# Patient Record
Sex: Female | Born: 1955 | Race: White | Hispanic: No | Marital: Married | Smoking: Current every day smoker
Health system: Southern US, Community
[De-identification: ages and names within clinical notes are randomized; demographics above are authoritative.]

## PROBLEM LIST (undated history)

## (undated) DIAGNOSIS — Z720 Tobacco use: Secondary | ICD-10-CM

## (undated) DIAGNOSIS — E1165 Type 2 diabetes mellitus with hyperglycemia: Secondary | ICD-10-CM

## (undated) DIAGNOSIS — I639 Cerebral infarction, unspecified: Secondary | ICD-10-CM

## (undated) DIAGNOSIS — E119 Type 2 diabetes mellitus without complications: Secondary | ICD-10-CM

## (undated) DIAGNOSIS — I10 Essential (primary) hypertension: Secondary | ICD-10-CM

---

## 1997-09-05 ENCOUNTER — Emergency Department (HOSPITAL_COMMUNITY): Admission: EM | Admit: 1997-09-05 | Discharge: 1997-09-05 | Payer: Self-pay | Admitting: Emergency Medicine

## 1997-11-26 ENCOUNTER — Emergency Department (HOSPITAL_COMMUNITY): Admission: EM | Admit: 1997-11-26 | Discharge: 1997-11-26 | Payer: Self-pay | Admitting: *Deleted

## 1999-03-27 ENCOUNTER — Inpatient Hospital Stay (HOSPITAL_COMMUNITY): Admission: EM | Admit: 1999-03-27 | Discharge: 1999-03-29 | Payer: Self-pay | Admitting: Emergency Medicine

## 1999-03-27 ENCOUNTER — Encounter: Payer: Self-pay | Admitting: *Deleted

## 2018-05-14 ENCOUNTER — Other Ambulatory Visit: Payer: Self-pay

## 2018-05-14 ENCOUNTER — Emergency Department (HOSPITAL_COMMUNITY): Payer: Medicaid Other

## 2018-05-14 ENCOUNTER — Inpatient Hospital Stay (HOSPITAL_COMMUNITY): Payer: Medicaid Other | Admitting: Anesthesiology

## 2018-05-14 ENCOUNTER — Inpatient Hospital Stay (HOSPITAL_COMMUNITY): Payer: Medicaid Other

## 2018-05-14 ENCOUNTER — Encounter (HOSPITAL_COMMUNITY): Admission: EM | Disposition: A | Discharge status: Home Health | Payer: Self-pay | Source: Home / Self Care

## 2018-05-14 ENCOUNTER — Inpatient Hospital Stay (HOSPITAL_COMMUNITY)
Admission: EM | Admit: 2018-05-14 | Discharge: 2018-05-20 | DRG: 492 | Disposition: A | Discharge status: Home Health | Payer: Medicaid Other | Attending: General Surgery | Admitting: General Surgery

## 2018-05-14 ENCOUNTER — Encounter (HOSPITAL_COMMUNITY): Payer: Self-pay

## 2018-05-14 DIAGNOSIS — R402252 Coma scale, best verbal response, oriented, at arrival to emergency department: Secondary | ICD-10-CM | POA: Diagnosis present

## 2018-05-14 DIAGNOSIS — S2242XA Multiple fractures of ribs, left side, initial encounter for closed fracture: Secondary | ICD-10-CM | POA: Diagnosis present

## 2018-05-14 DIAGNOSIS — E119 Type 2 diabetes mellitus without complications: Secondary | ICD-10-CM | POA: Diagnosis present

## 2018-05-14 DIAGNOSIS — R402362 Coma scale, best motor response, obeys commands, at arrival to emergency department: Secondary | ICD-10-CM | POA: Diagnosis present

## 2018-05-14 DIAGNOSIS — Y92410 Unspecified street and highway as the place of occurrence of the external cause: Secondary | ICD-10-CM

## 2018-05-14 DIAGNOSIS — S52009B Unspecified fracture of upper end of unspecified ulna, initial encounter for open fracture type I or II: Secondary | ICD-10-CM | POA: Diagnosis present

## 2018-05-14 DIAGNOSIS — S8255XA Nondisplaced fracture of medial malleolus of left tibia, initial encounter for closed fracture: Secondary | ICD-10-CM | POA: Diagnosis present

## 2018-05-14 DIAGNOSIS — S42413B Displaced simple supracondylar fracture without intercondylar fracture of unspecified humerus, initial encounter for open fracture: Principal | ICD-10-CM | POA: Diagnosis present

## 2018-05-14 DIAGNOSIS — Z8673 Personal history of transient ischemic attack (TIA), and cerebral infarction without residual deficits: Secondary | ICD-10-CM

## 2018-05-14 DIAGNOSIS — M542 Cervicalgia: Secondary | ICD-10-CM | POA: Diagnosis present

## 2018-05-14 DIAGNOSIS — S52272C Monteggia's fracture of left ulna, initial encounter for open fracture type IIIA, IIIB, or IIIC: Secondary | ICD-10-CM | POA: Diagnosis present

## 2018-05-14 DIAGNOSIS — S8253XA Displaced fracture of medial malleolus of unspecified tibia, initial encounter for closed fracture: Secondary | ICD-10-CM

## 2018-05-14 DIAGNOSIS — R402142 Coma scale, eyes open, spontaneous, at arrival to emergency department: Secondary | ICD-10-CM | POA: Diagnosis present

## 2018-05-14 DIAGNOSIS — F1290 Cannabis use, unspecified, uncomplicated: Secondary | ICD-10-CM | POA: Diagnosis present

## 2018-05-14 DIAGNOSIS — D62 Acute posthemorrhagic anemia: Secondary | ICD-10-CM | POA: Diagnosis present

## 2018-05-14 DIAGNOSIS — S42492B Other displaced fracture of lower end of left humerus, initial encounter for open fracture: Secondary | ICD-10-CM

## 2018-05-14 DIAGNOSIS — I10 Essential (primary) hypertension: Secondary | ICD-10-CM | POA: Diagnosis present

## 2018-05-14 DIAGNOSIS — S46312A Strain of muscle, fascia and tendon of triceps, left arm, initial encounter: Secondary | ICD-10-CM | POA: Insufficient documentation

## 2018-05-14 DIAGNOSIS — S2232XA Fracture of one rib, left side, initial encounter for closed fracture: Secondary | ICD-10-CM

## 2018-05-14 DIAGNOSIS — G8929 Other chronic pain: Secondary | ICD-10-CM | POA: Diagnosis present

## 2018-05-14 DIAGNOSIS — Z419 Encounter for procedure for purposes other than remedying health state, unspecified: Secondary | ICD-10-CM

## 2018-05-14 DIAGNOSIS — Z79899 Other long term (current) drug therapy: Secondary | ICD-10-CM

## 2018-05-14 DIAGNOSIS — F17210 Nicotine dependence, cigarettes, uncomplicated: Secondary | ICD-10-CM | POA: Diagnosis present

## 2018-05-14 DIAGNOSIS — E1165 Type 2 diabetes mellitus with hyperglycemia: Secondary | ICD-10-CM | POA: Diagnosis present

## 2018-05-14 DIAGNOSIS — S42402B Unspecified fracture of lower end of left humerus, initial encounter for open fracture: Secondary | ICD-10-CM

## 2018-05-14 DIAGNOSIS — T1490XA Injury, unspecified, initial encounter: Secondary | ICD-10-CM | POA: Diagnosis present

## 2018-05-14 DIAGNOSIS — R609 Edema, unspecified: Secondary | ICD-10-CM

## 2018-05-14 DIAGNOSIS — T148XXA Other injury of unspecified body region, initial encounter: Secondary | ICD-10-CM

## 2018-05-14 DIAGNOSIS — S52272B Monteggia's fracture of left ulna, initial encounter for open fracture type I or II: Secondary | ICD-10-CM | POA: Diagnosis present

## 2018-05-14 DIAGNOSIS — S42402A Unspecified fracture of lower end of left humerus, initial encounter for closed fracture: Secondary | ICD-10-CM | POA: Diagnosis present

## 2018-05-14 HISTORY — DX: Essential (primary) hypertension: I10

## 2018-05-14 HISTORY — PX: EXTERNAL FIXATION ARM: SHX1552

## 2018-05-14 HISTORY — DX: Type 2 diabetes mellitus without complications: E11.9

## 2018-05-14 HISTORY — DX: Type 2 diabetes mellitus with hyperglycemia: E11.65

## 2018-05-14 HISTORY — DX: Cerebral infarction, unspecified: I63.9

## 2018-05-14 LAB — COMPREHENSIVE METABOLIC PANEL
ALT: 19 U/L (ref 0–44)
AST: 28 U/L (ref 15–41)
Albumin: 3.7 g/dL (ref 3.5–5.0)
Alkaline Phosphatase: 82 U/L (ref 38–126)
Anion gap: 15 (ref 5–15)
BUN: 14 mg/dL (ref 8–23)
CO2: 21 mmol/L — ABNORMAL LOW (ref 22–32)
Calcium: 9.1 mg/dL (ref 8.9–10.3)
Chloride: 100 mmol/L (ref 98–111)
Creatinine, Ser: 0.74 mg/dL (ref 0.44–1.00)
GFR calc Af Amer: >60 mL/min (ref >60)
GFR calc non Af Amer: >60 mL/min (ref >60)
Glucose, Bld: 348 mg/dL — ABNORMAL HIGH (ref 70–99)
Potassium: 3.4 mmol/L — ABNORMAL LOW (ref 3.5–5.1)
Sodium: 136 mmol/L (ref 135–145)
Total Bilirubin: 1 mg/dL (ref 0.3–1.2)
Total Protein: 6.7 g/dL (ref 6.5–8.1)

## 2018-05-14 LAB — CBC
HCT: 44.1 % (ref 36.0–46.0)
Hemoglobin: 14.3 g/dL (ref 12.0–15.0)
MCH: 30.5 pg (ref 26.0–34.0)
MCHC: 32.4 g/dL (ref 30.0–36.0)
MCV: 94 fL (ref 80.0–100.0)
Platelets: 279 10*3/uL (ref 150–400)
RBC: 4.69 MIL/uL (ref 3.87–5.11)
RDW: 12 % (ref 11.5–15.5)
WBC: 10.7 10*3/uL — ABNORMAL HIGH (ref 4.0–10.5)
nRBC: 0 % (ref 0.0–0.2)

## 2018-05-14 LAB — GLUCOSE, CAPILLARY
Glucose-Capillary: 359 mg/dL — ABNORMAL HIGH (ref 70–99)
Glucose-Capillary: 303 mg/dL — ABNORMAL HIGH (ref 70–99)
Glucose-Capillary: 296 mg/dL — ABNORMAL HIGH (ref 70–99)
Glucose-Capillary: 272 mg/dL — ABNORMAL HIGH (ref 70–99)
Glucose-Capillary: 239 mg/dL — ABNORMAL HIGH (ref 70–99)

## 2018-05-14 LAB — PROTIME-INR
INR: 0.9 (ref 0.8–1.2)
Prothrombin Time: 12.1 seconds (ref 11.4–15.2)

## 2018-05-14 LAB — LACTIC ACID, PLASMA: Lactic Acid, Venous: 3 mmol/L (ref 0.5–1.9)

## 2018-05-14 LAB — HEMOGLOBIN A1C
Hgb A1c MFr Bld: 9.9 % — ABNORMAL HIGH (ref 4.8–5.6)
Mean Plasma Glucose: 237.43 mg/dL

## 2018-05-14 LAB — MRSA PCR SCREENING: MRSA by PCR: NEGATIVE

## 2018-05-14 LAB — SAMPLE TO BLOOD BANK

## 2018-05-14 LAB — ETHANOL: Alcohol, Ethyl (B): <10 mg/dL (ref <10)

## 2018-05-14 LAB — CBG MONITORING, ED: Glucose-Capillary: 376 mg/dL — ABNORMAL HIGH (ref 70–99)

## 2018-05-14 LAB — CDS SEROLOGY
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
## 2018-05-14 IMAGING — CT 3-DIMENSIONAL CT IMAGE RENDERING ON ACQUISITION WORKSTATION
2 of 3 series · 15 of 46 positions shown, 17 images · non-contrast
Comparison: Left elbow x-rays from same day.

CLINICAL DATA: Distal humerus and proximal ulna fractures.

EXAM:
3-DIMENSIONAL CT IMAGE RENDERING ON ACQUISITION WORKSTATION
TECHNIQUE: 3-dimensional CT images were rendered by post-processing of the
original CT data on an acquisition workstation. The 3-dimensional CT
images were interpreted and findings were reported in the
accompanying complete CT report for this study.

[Series 6: extremity soft tissue · axial · 0.47mm/px · z∈[-876,-660]mm · 12 of 126 slices shown, 14 images]
[im 9/126  soft-tissue]
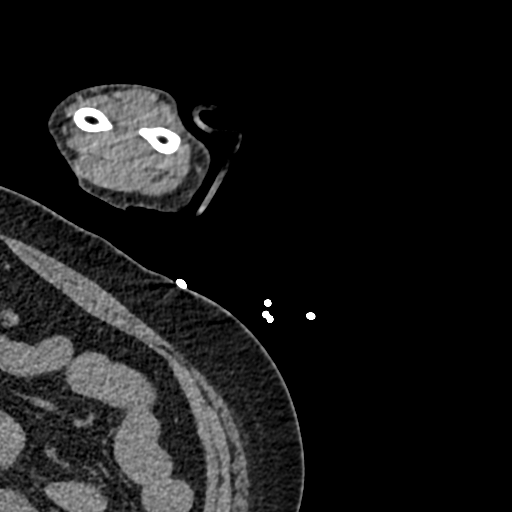
[im 9/126  bone]
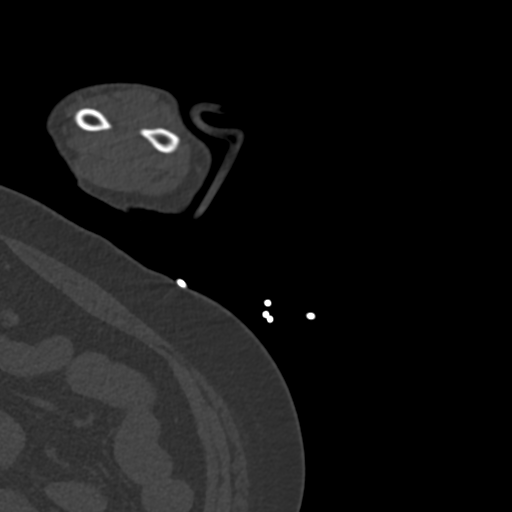
[im 17/126  soft-tissue]
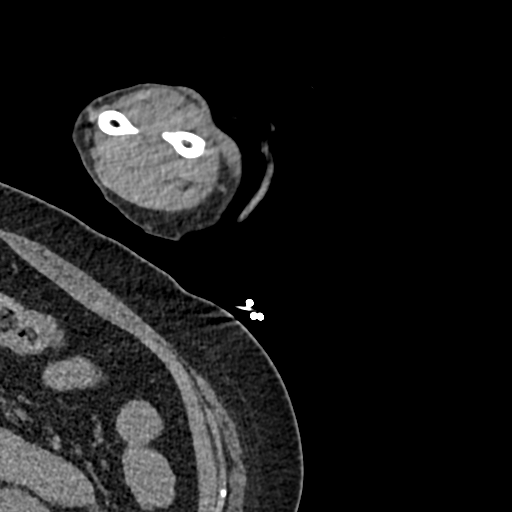
[im 29/126  soft-tissue]
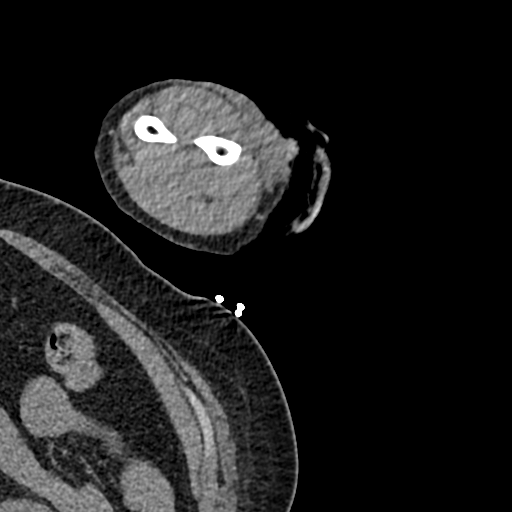
[im 37/126  soft-tissue]
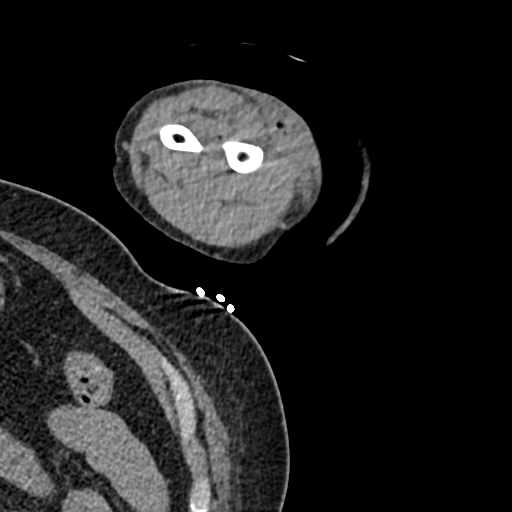
[im 49/126  soft-tissue]
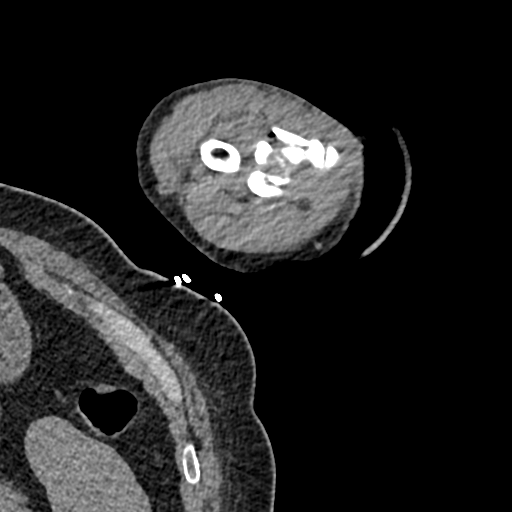
[im 57/126  soft-tissue]
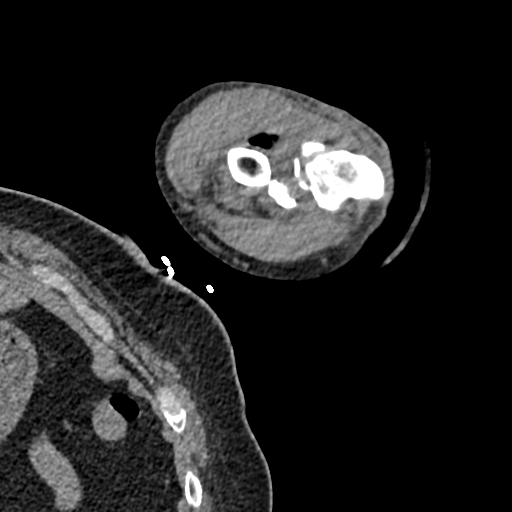
[im 69/126  soft-tissue]
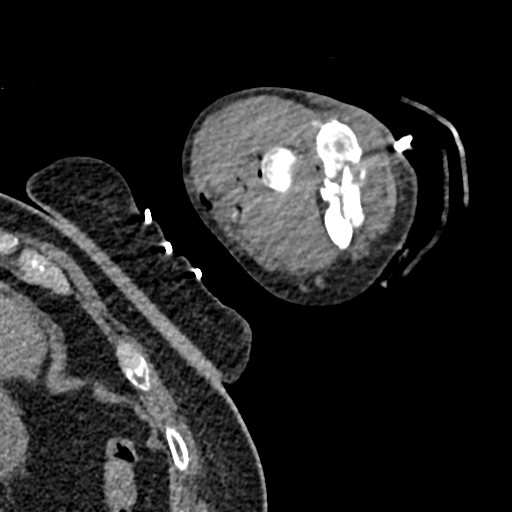
[im 77/126  soft-tissue]
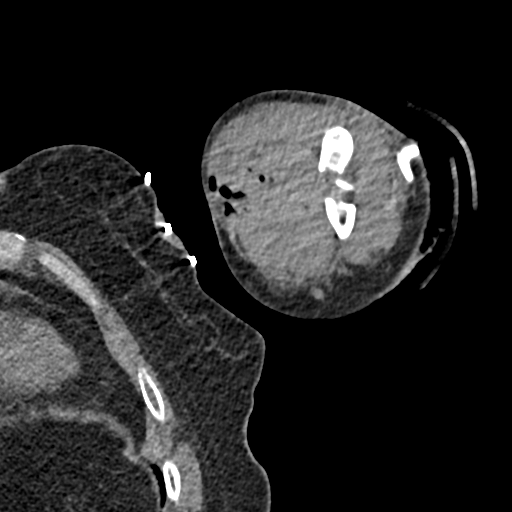
[im 89/126  soft-tissue]
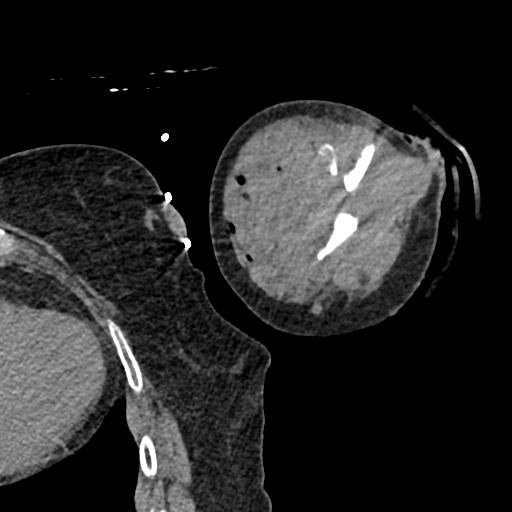
[im 89/126  bone]
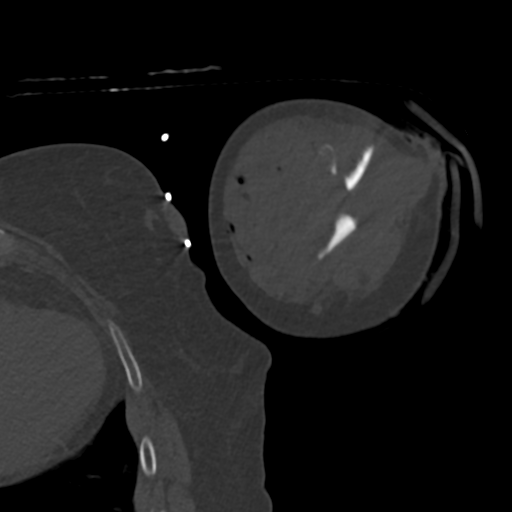
[im 97/126  soft-tissue]
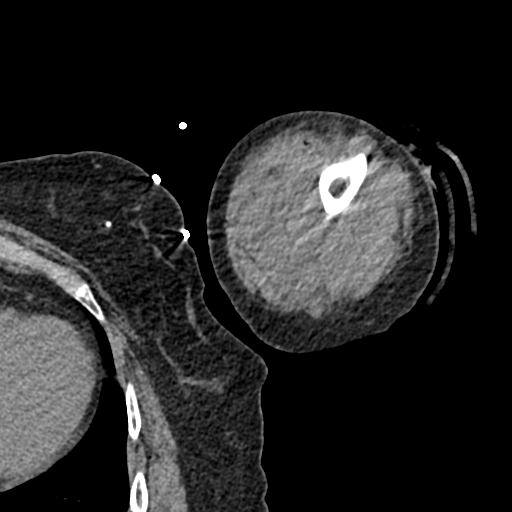
[im 109/126  soft-tissue]
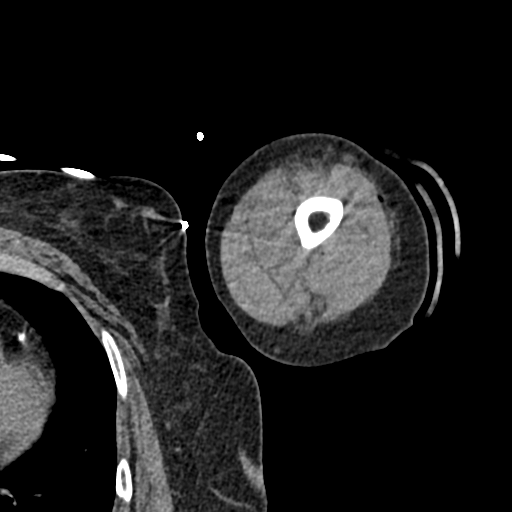
[im 117/126  soft-tissue]
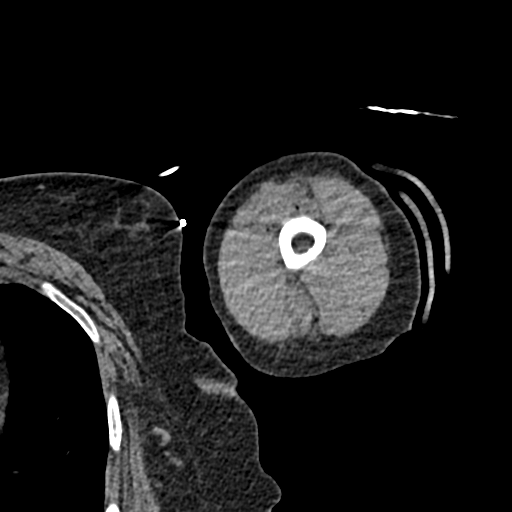

[Series 9: cor soft · coronal · 0.28mm/px · 3 of 66 slices shown]
[im 22/66  soft-tissue]
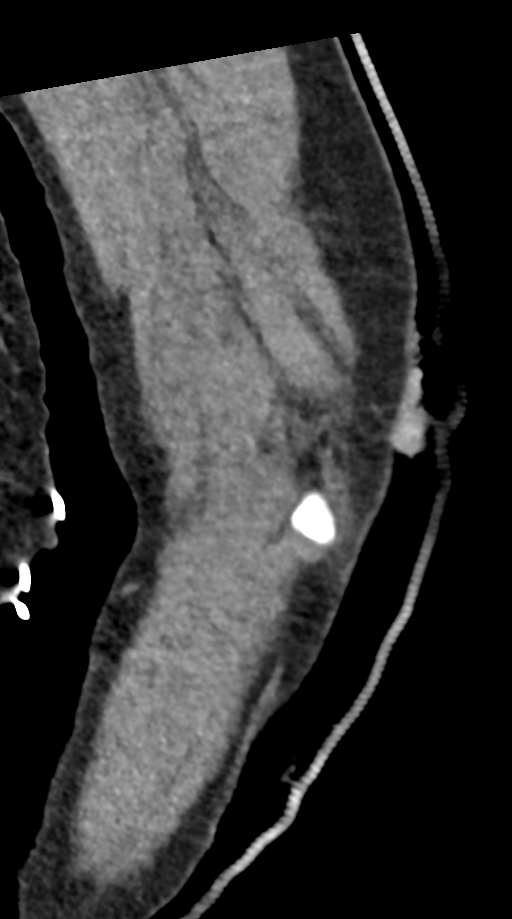
[im 29/66  soft-tissue]
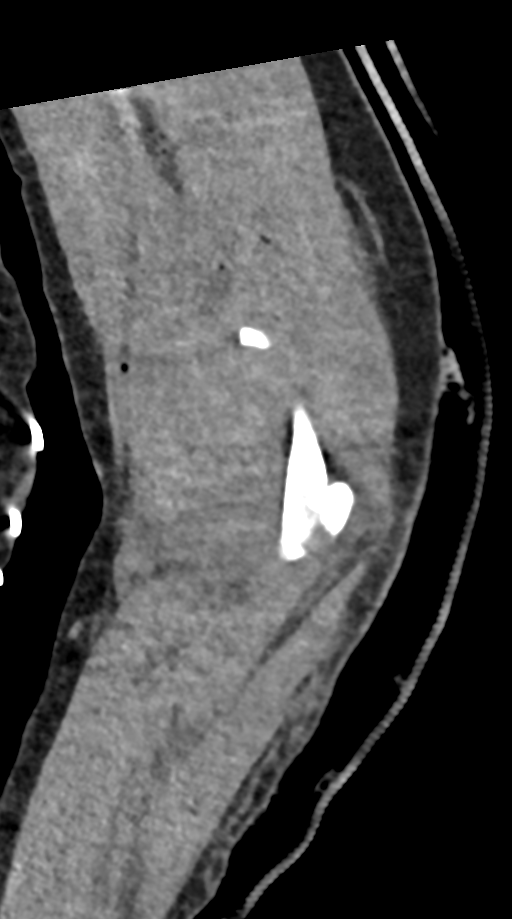
[im 37/66  soft-tissue]
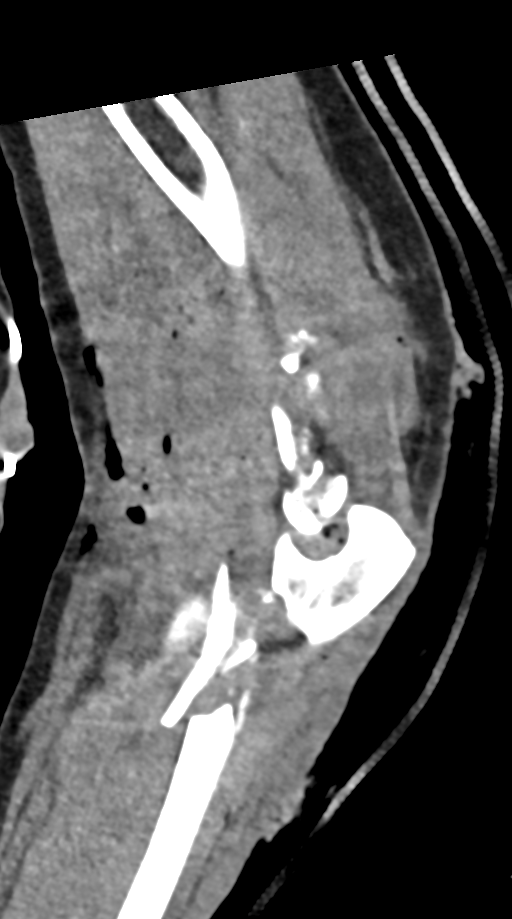

[15 of 46 positions shown; findings below may reference images not displayed]

FINDINGS: Three-dimensional CT images again demonstrate severely comminuted,
displaced fractures of the distal humerus and proximal ulna with
radial head dislocation.
IMPRESSION: Three-dimensional reconstructions of left elbow
fracture-dislocation, fully characterized in the separate CT elbow
report from same day.

## 2018-05-14 IMAGING — RF LEFT ELBOW - 2 VIEW
1 series · 6 of 6 positions shown · non-contrast
Comparison: Radiography same day

CLINICAL DATA: External fixation

EXAM:
DG C-ARM 61-120 MIN; LEFT ELBOW - 2 VIEW

[Series 1: run · 6 of 6 slices shown]
[im 1/6]
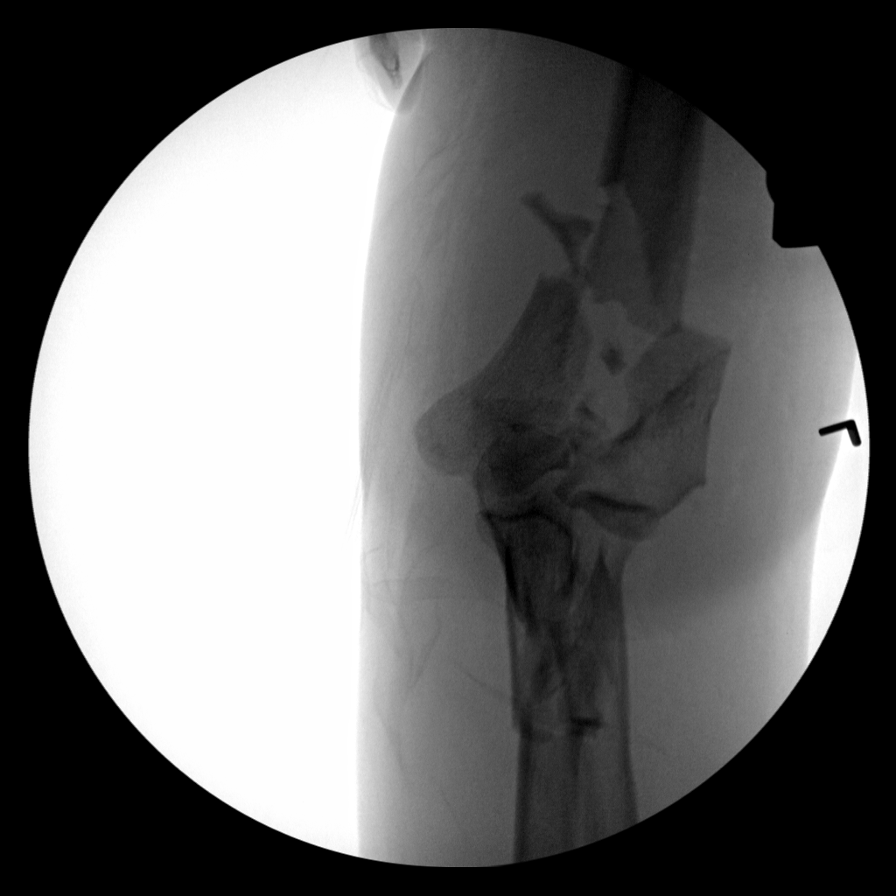
[im 2/6]
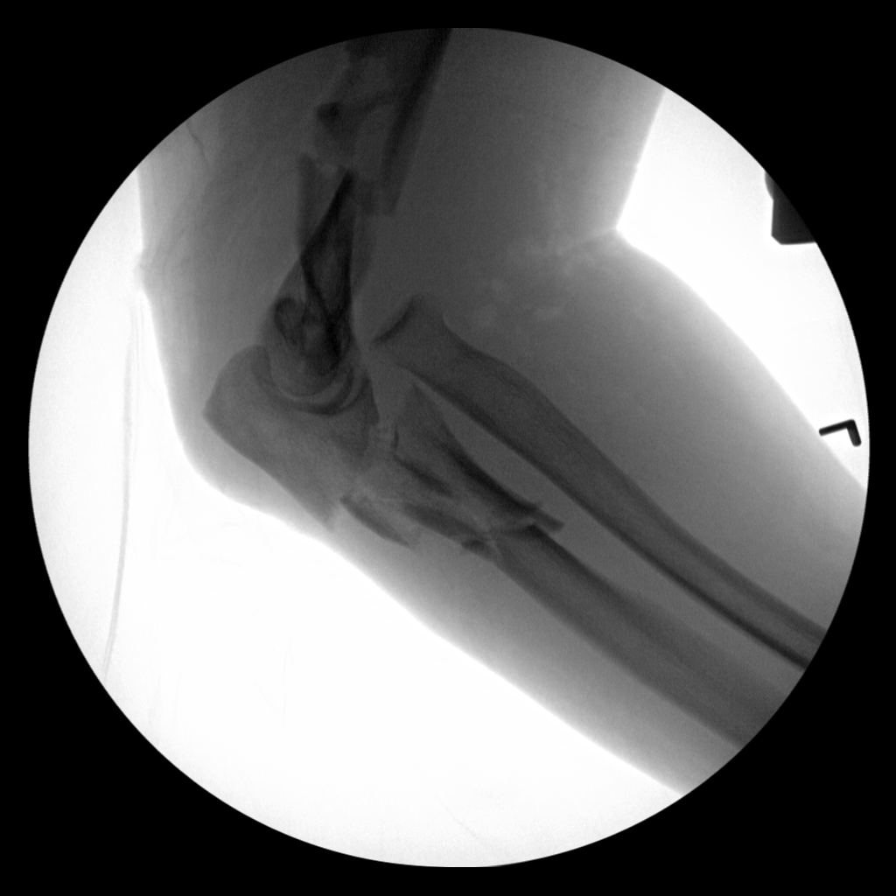
[im 3/6]
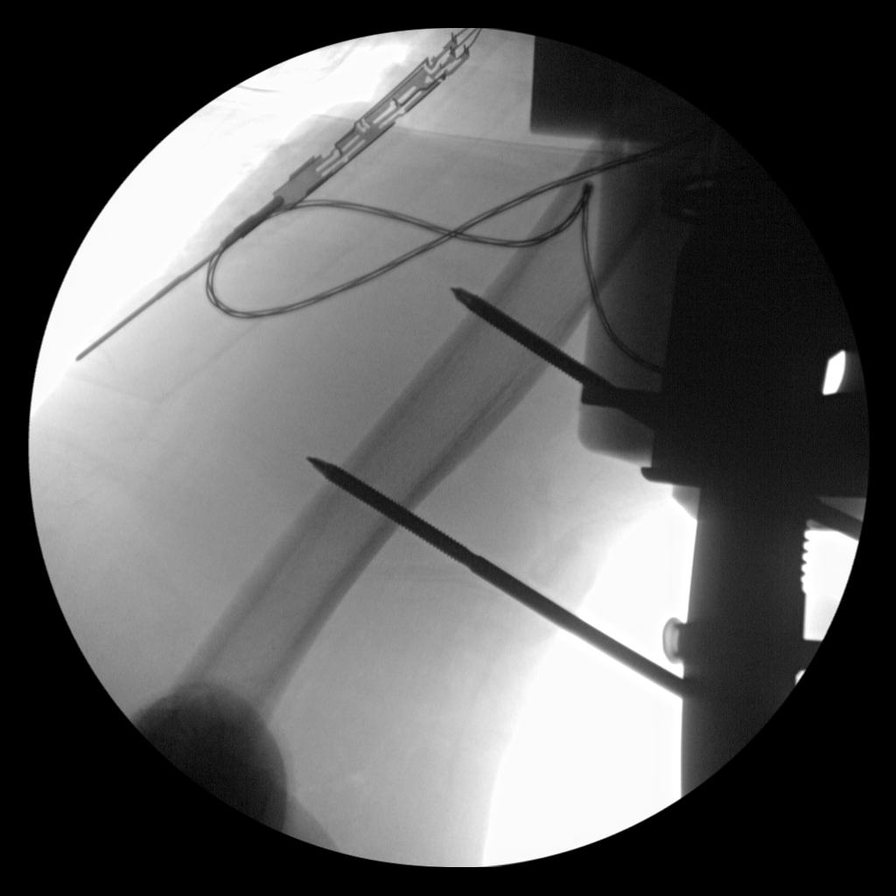
[im 4/6]
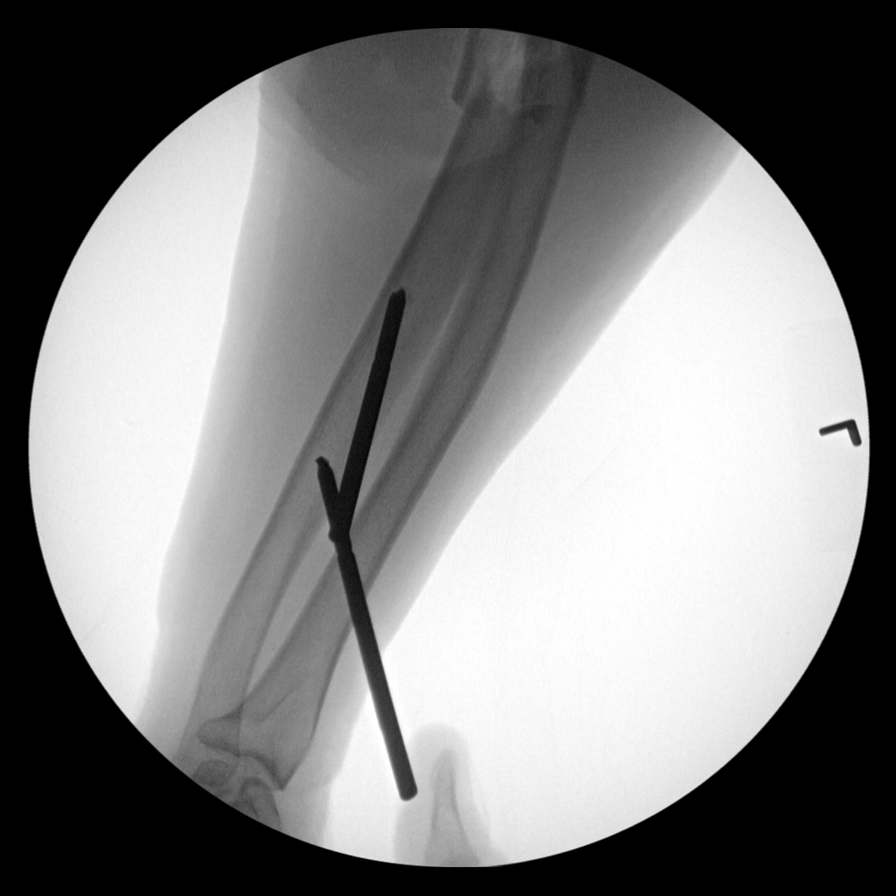
[im 5/6]
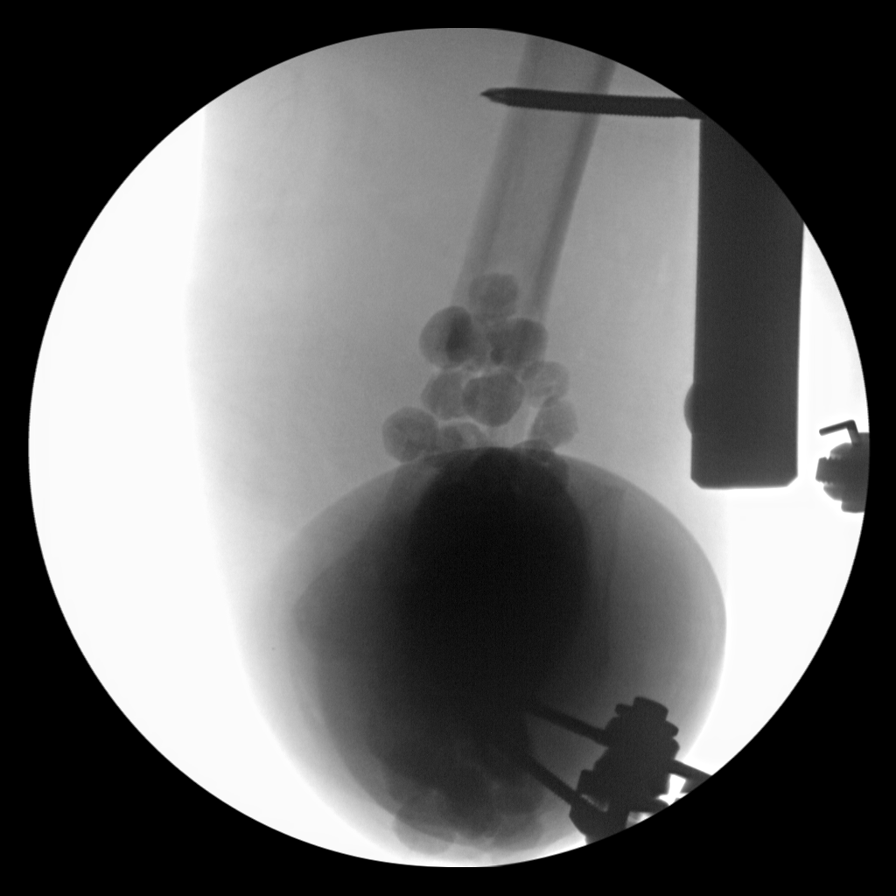
[im 6/6]
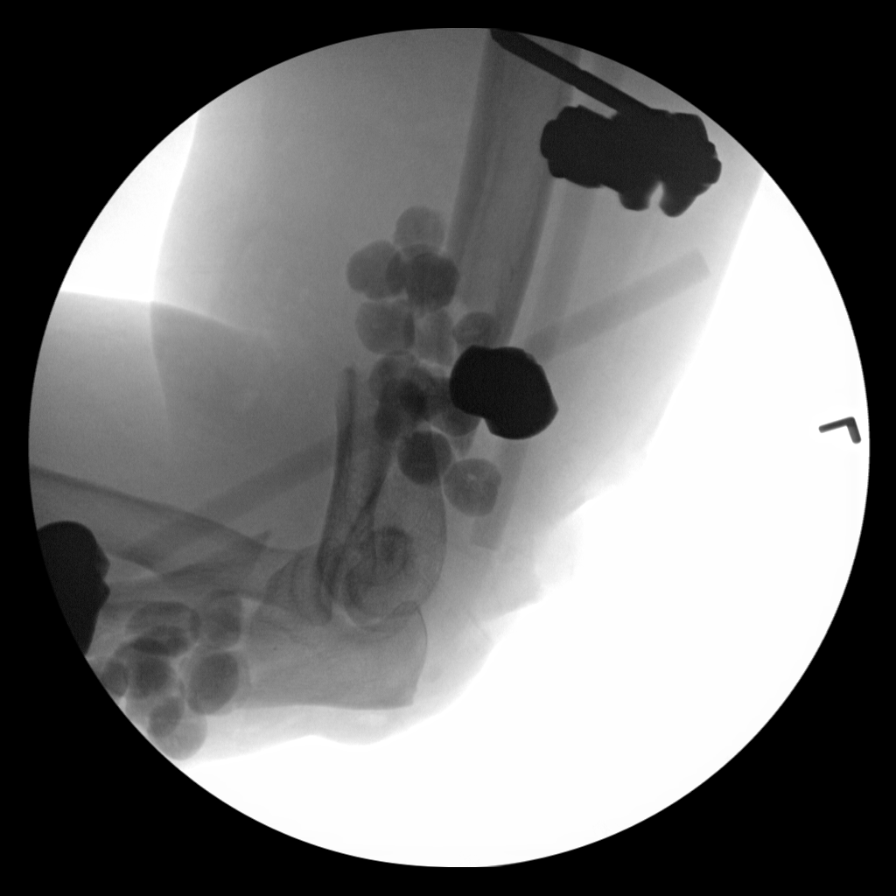

[6 of 6 positions shown; findings below may reference images not displayed]

FINDINGS: External fixation is applied for massively comminuted fractures of
the distal humerus proximal ulna with dislocation of the radial
head. Treatment beads are placed in the regions the fractures.
IMPRESSION: External fixation. Multiple treatment bead implants at the sites of
the comminuted open fractures

## 2018-05-14 IMAGING — DX LEFT ELBOW - 2 VIEW
2 series · 2 of 2 positions shown · non-contrast
Comparison: Intraoperative films of earlier today.

CLINICAL DATA: Fracture.

EXAM:
LEFT ELBOW - 2 VIEW

[elbow ap]
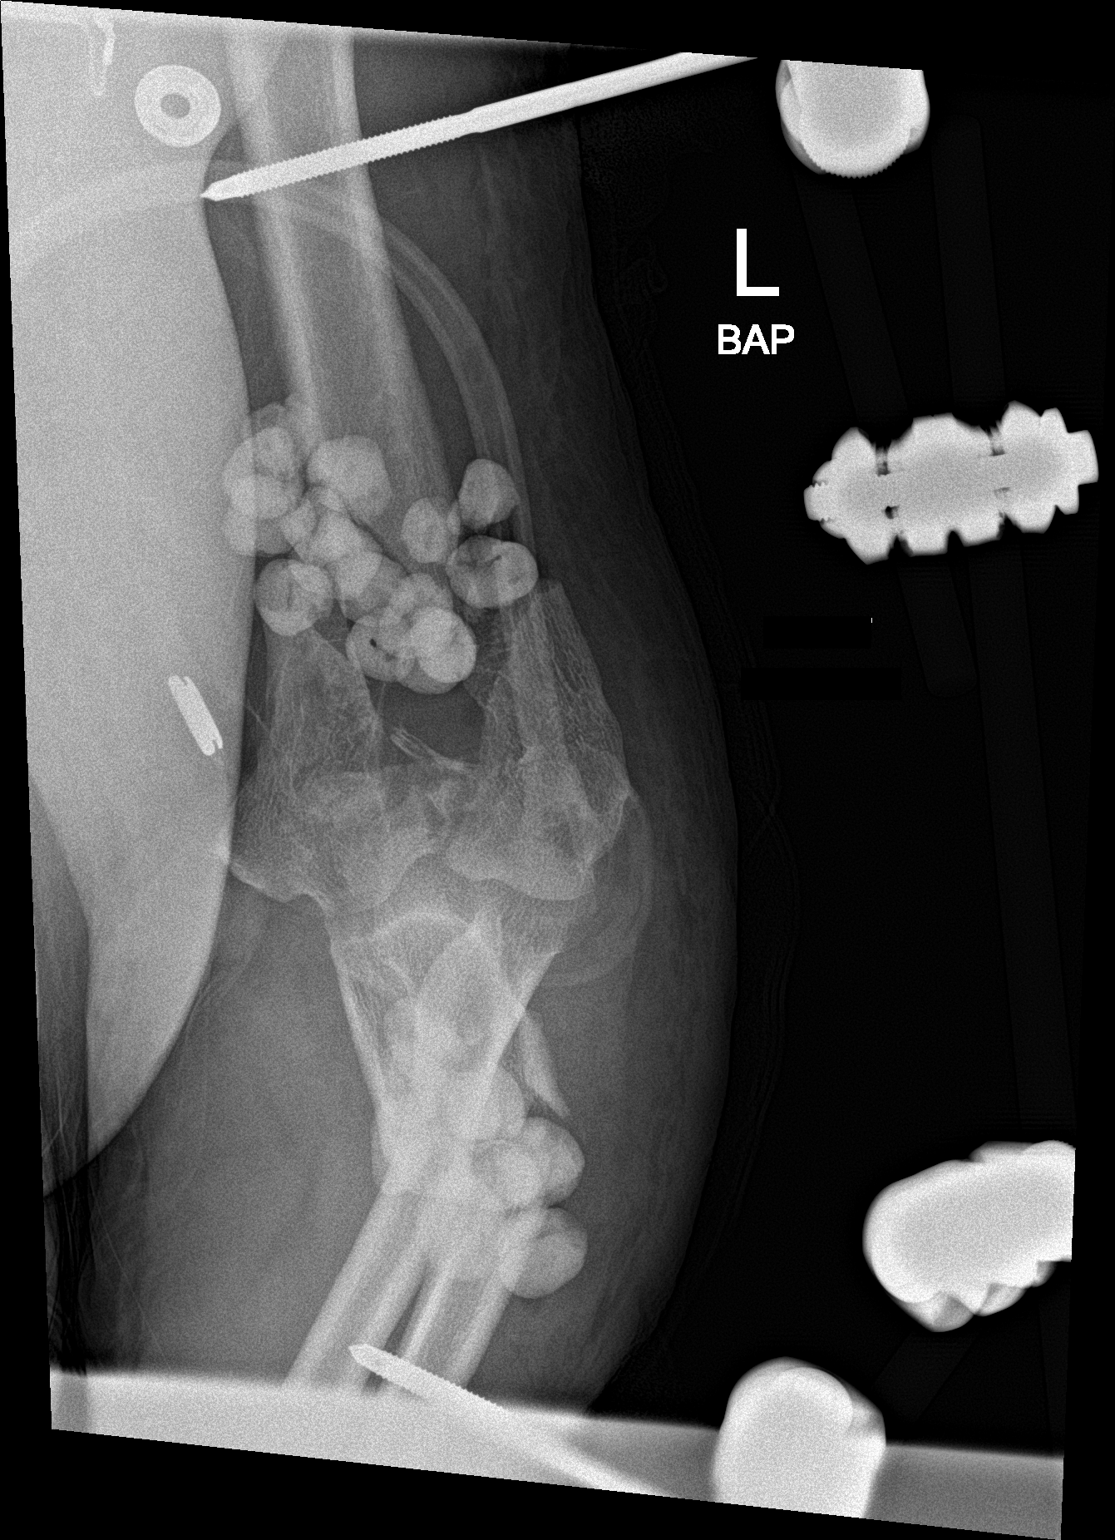

[elbow lat]
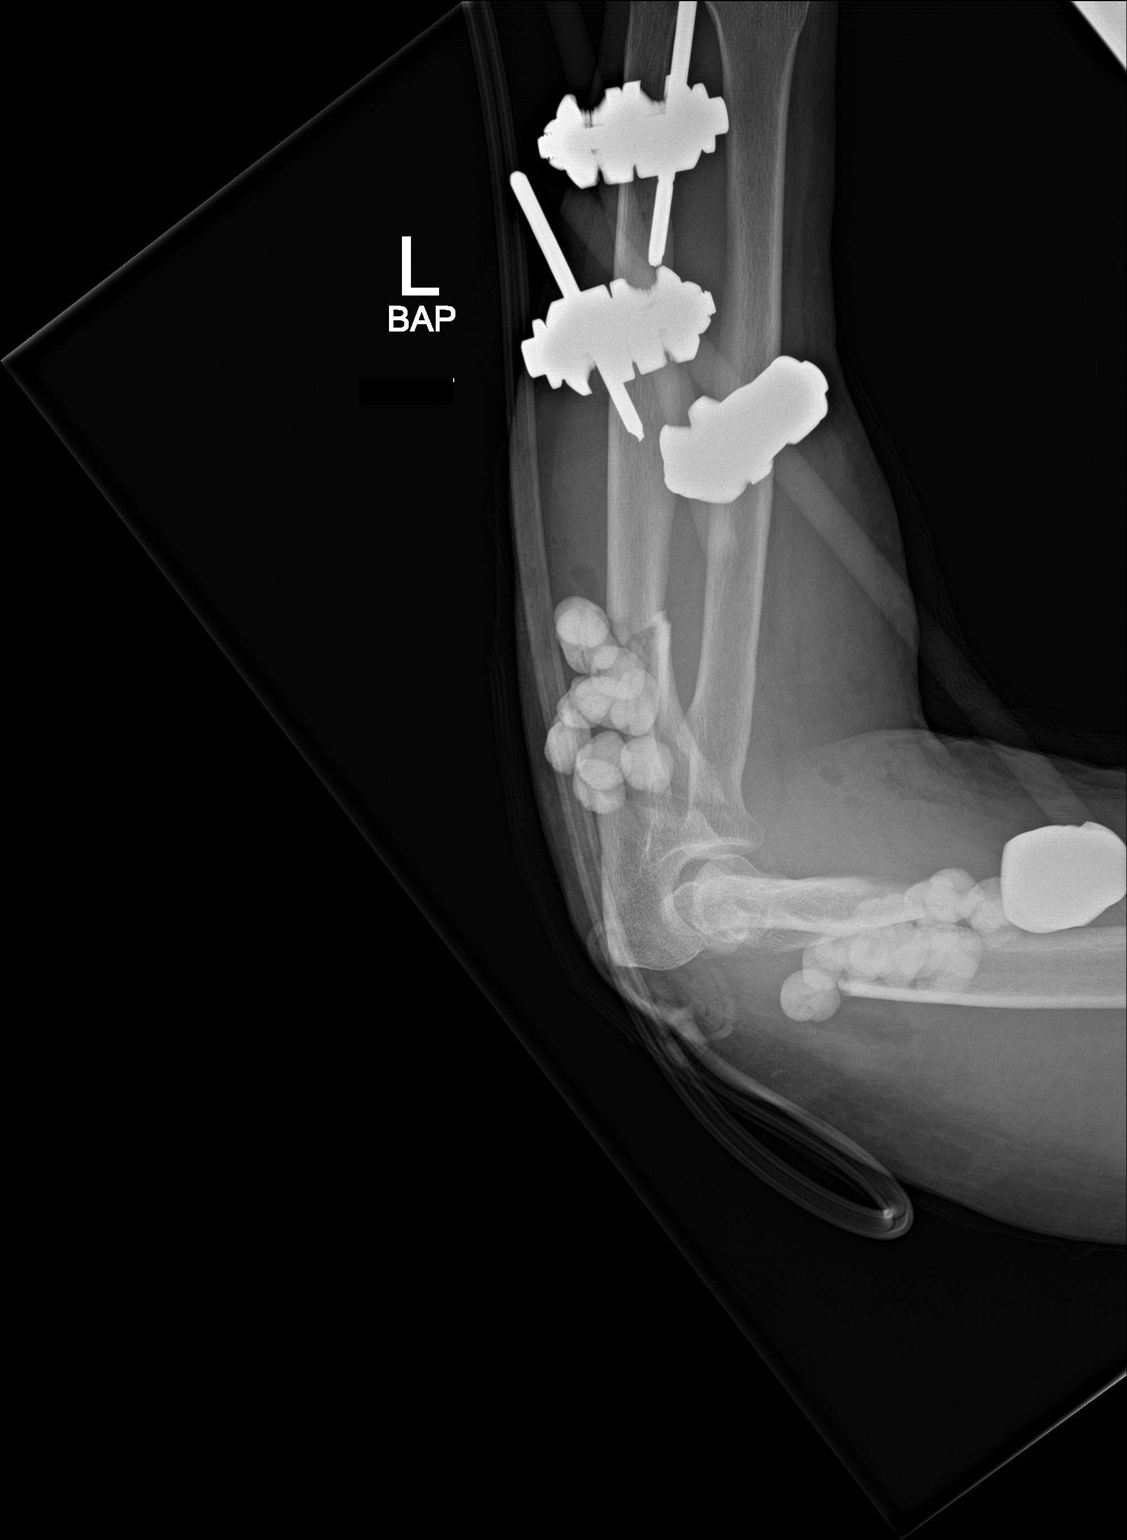

[2 of 2 positions shown; findings below may reference images not displayed]

FINDINGS: [1B] hours. External fixation devices which are incompletely imaged.
Comminuted fractures of the distal humerus and proximal ulna, with
treatment beads. The radial head appears relocated on the lateral
view. The attempted AP view is suboptimal secondary to positioning.
The humerus fracture remains significantly displaced with override.
IMPRESSION: Distal humerus and proximal ulnar fractures, s/p external fixation.
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
## 2018-05-14 SURGERY — EXTERNAL FIXATION, UPPER EXTREMITY
Anesthesia: General | Site: Elbow | Laterality: Left

## 2018-05-14 MED ORDER — ONDANSETRON HCL 4 MG/2ML IJ SOLN
INTRAMUSCULAR | Status: AC
  Filled 2018-05-14: qty 2

## 2018-05-14 MED ORDER — SUGAMMADEX SODIUM 500 MG/5ML IV SOLN
INTRAVENOUS | Status: DC | PRN
  Administered 2018-05-14: 200 mg via INTRAVENOUS

## 2018-05-14 MED ORDER — METHOCARBAMOL 500 MG PO TABS
ORAL_TABLET | ORAL | Status: AC
  Filled 2018-05-14: qty 1

## 2018-05-14 MED ORDER — SCOPOLAMINE 1 MG/3DAYS TD PT72
MEDICATED_PATCH | TRANSDERMAL | Status: DC | PRN
  Administered 2018-05-14: 1 via TRANSDERMAL

## 2018-05-14 MED ORDER — ROCURONIUM BROMIDE 50 MG/5ML IV SOSY
PREFILLED_SYRINGE | INTRAVENOUS | Status: DC | PRN
  Administered 2018-05-14: 30 mg via INTRAVENOUS

## 2018-05-14 MED ORDER — FENTANYL CITRATE (PF) 100 MCG/2ML IJ SOLN
INTRAMUSCULAR | Status: AC | PRN
  Administered 2018-05-14: 50 ug via INTRAVENOUS

## 2018-05-14 MED ORDER — FENTANYL CITRATE (PF) 250 MCG/5ML IJ SOLN
INTRAMUSCULAR | Status: AC
  Filled 2018-05-14: qty 5

## 2018-05-14 MED ORDER — DIPHENHYDRAMINE HCL 50 MG/ML IJ SOLN
INTRAMUSCULAR | Status: DC | PRN
  Administered 2018-05-14: 12.5 mg via INTRAVENOUS

## 2018-05-14 MED ORDER — PANTOPRAZOLE SODIUM 40 MG PO TBEC
40.0000 mg | DELAYED_RELEASE_TABLET | Freq: Every day | ORAL | Status: DC
  Administered 2018-05-15 – 2018-05-20 (×5): 40 mg via ORAL
  Filled 2018-05-14 (×5): qty 1

## 2018-05-14 MED ORDER — ACETAMINOPHEN 325 MG PO TABS
650.0000 mg | ORAL_TABLET | Freq: Four times a day (QID) | ORAL | Status: DC
  Administered 2018-05-14 – 2018-05-20 (×18): 650 mg via ORAL
  Filled 2018-05-14 (×19): qty 2

## 2018-05-14 MED ORDER — VANCOMYCIN HCL 1000 MG IV SOLR
INTRAVENOUS | Status: DC | PRN
  Administered 2018-05-14: 1000 mg via TOPICAL
  Administered 2018-05-14: 1000 mg

## 2018-05-14 MED ORDER — SUCCINYLCHOLINE CHLORIDE 200 MG/10ML IV SOSY
PREFILLED_SYRINGE | INTRAVENOUS | Status: DC | PRN
  Administered 2018-05-14: 200 mg via INTRAVENOUS

## 2018-05-14 MED ORDER — SODIUM CHLORIDE 0.9 % IV SOLN
2.0000 g | INTRAVENOUS | Status: AC
  Administered 2018-05-14 – 2018-05-16 (×3): 2 g via INTRAVENOUS
  Filled 2018-05-14 (×4): qty 20

## 2018-05-14 MED ORDER — PROMETHAZINE HCL 25 MG/ML IJ SOLN
6.2500 mg | Freq: Four times a day (QID) | INTRAMUSCULAR | Status: DC | PRN
  Administered 2018-05-14: 6.25 mg via INTRAVENOUS

## 2018-05-14 MED ORDER — OXYCODONE HCL 5 MG PO TABS
5.0000 mg | ORAL_TABLET | ORAL | Status: DC | PRN
  Administered 2018-05-14 – 2018-05-16 (×10): 10 mg via ORAL
  Administered 2018-05-17: 5 mg via ORAL
  Administered 2018-05-17 – 2018-05-19 (×6): 10 mg via ORAL
  Filled 2018-05-14: qty 2
  Filled 2018-05-14: qty 1
  Filled 2018-05-14 (×17): qty 2

## 2018-05-14 MED ORDER — HYDROMORPHONE HCL 1 MG/ML IJ SOLN
INTRAMUSCULAR | Status: AC
  Filled 2018-05-14: qty 1

## 2018-05-14 MED ORDER — HYDROMORPHONE HCL 1 MG/ML IJ SOLN: 1.0000 mg | INTRAMUSCULAR | Status: DC | PRN

## 2018-05-14 MED ORDER — PROMETHAZINE HCL 25 MG/ML IJ SOLN
INTRAMUSCULAR | Status: AC
  Filled 2018-05-14: qty 1

## 2018-05-14 MED ORDER — VANCOMYCIN HCL 1000 MG IV SOLR
INTRAVENOUS | Status: AC
  Filled 2018-05-14: qty 1000

## 2018-05-14 MED ORDER — INSULIN ASPART 100 UNIT/ML ~~LOC~~ SOLN
SUBCUTANEOUS | Status: DC | PRN
  Administered 2018-05-14: 4 [IU] via SUBCUTANEOUS

## 2018-05-14 MED ORDER — CEFAZOLIN SODIUM-DEXTROSE 1-4 GM/50ML-% IV SOLN
1.0000 g | Freq: Once | INTRAVENOUS | Status: DC
  Filled 2018-05-14: qty 50

## 2018-05-14 MED ORDER — INSULIN ASPART 100 UNIT/ML ~~LOC~~ SOLN
4.0000 [IU] | SUBCUTANEOUS | Status: DC
  Filled 2018-05-14: qty 0.04

## 2018-05-14 MED ORDER — TOBRAMYCIN SULFATE 1.2 G IJ SOLR
INTRAMUSCULAR | Status: AC
  Filled 2018-05-14: qty 2.4

## 2018-05-14 MED ORDER — METOPROLOL TARTRATE 12.5 MG HALF TABLET
12.5000 mg | ORAL_TABLET | Freq: Two times a day (BID) | ORAL | Status: DC
  Administered 2018-05-15 – 2018-05-20 (×11): 12.5 mg via ORAL
  Filled 2018-05-14 (×11): qty 1

## 2018-05-14 MED ORDER — CEFAZOLIN SODIUM-DEXTROSE 1-4 GM/50ML-% IV SOLN
1.0000 g | Freq: Three times a day (TID) | INTRAVENOUS | Status: DC
  Administered 2018-05-14 (×2): 1 g via INTRAVENOUS
  Filled 2018-05-14 (×2): qty 50

## 2018-05-14 MED ORDER — LIDOCAINE 2% (20 MG/ML) 5 ML SYRINGE
INTRAMUSCULAR | Status: AC
  Filled 2018-05-14: qty 5

## 2018-05-14 MED ORDER — SODIUM CHLORIDE 0.9 % IV SOLN
INTRAVENOUS | Status: DC
  Administered 2018-05-14 – 2018-05-19 (×4): via INTRAVENOUS

## 2018-05-14 MED ORDER — HYDRALAZINE HCL 20 MG/ML IJ SOLN
10.0000 mg | INTRAMUSCULAR | Status: DC | PRN
  Administered 2018-05-19: 04:00:00 10 mg via INTRAVENOUS
  Filled 2018-05-14: qty 1

## 2018-05-14 MED ORDER — TETANUS-DIPHTH-ACELL PERTUSSIS 5-2.5-18.5 LF-MCG/0.5 IM SUSP
0.5000 mL | Freq: Once | INTRAMUSCULAR | Status: AC
  Administered 2018-05-14: 10:00:00 0.5 mL via INTRAMUSCULAR
  Filled 2018-05-14: qty 0.5

## 2018-05-14 MED ORDER — ACETAMINOPHEN 325 MG PO TABS: 650.0000 mg | ORAL_TABLET | ORAL | Status: DC | PRN

## 2018-05-14 MED ORDER — 0.9 % SODIUM CHLORIDE (POUR BTL) OPTIME
TOPICAL | Status: DC | PRN
  Administered 2018-05-14: 1000 mL

## 2018-05-14 MED ORDER — DEXAMETHASONE SODIUM PHOSPHATE 10 MG/ML IJ SOLN
INTRAMUSCULAR | Status: AC
  Filled 2018-05-14: qty 1

## 2018-05-14 MED ORDER — SODIUM CHLORIDE 0.9 % IV SOLN
INTRAVENOUS | Status: DC | PRN
  Administered 2018-05-14: 16:00:00 via INTRAVENOUS

## 2018-05-14 MED ORDER — FENTANYL CITRATE (PF) 100 MCG/2ML IJ SOLN
50.0000 ug | Freq: Once | INTRAMUSCULAR | Status: AC
  Administered 2018-05-14 (×3): 50 ug via INTRAVENOUS
  Administered 2018-05-14 (×2): 100 ug via INTRAVENOUS

## 2018-05-14 MED ORDER — FENTANYL CITRATE (PF) 100 MCG/2ML IJ SOLN
25.0000 ug | INTRAMUSCULAR | Status: DC | PRN
  Administered 2018-05-14: 17:00:00 50 ug via INTRAVENOUS

## 2018-05-14 MED ORDER — PHENYLEPHRINE 40 MCG/ML (10ML) SYRINGE FOR IV PUSH (FOR BLOOD PRESSURE SUPPORT)
PREFILLED_SYRINGE | INTRAVENOUS | Status: AC
  Filled 2018-05-14: qty 10

## 2018-05-14 MED ORDER — DOCUSATE SODIUM 100 MG PO CAPS
100.0000 mg | ORAL_CAPSULE | Freq: Two times a day (BID) | ORAL | Status: DC
  Administered 2018-05-14 – 2018-05-20 (×11): 100 mg via ORAL
  Filled 2018-05-14 (×11): qty 1

## 2018-05-14 MED ORDER — SODIUM CHLORIDE 0.9 % IR SOLN
Status: DC | PRN
  Administered 2018-05-14 (×3): 3000 mL

## 2018-05-14 MED ORDER — PANTOPRAZOLE SODIUM 40 MG IV SOLR: 40.0000 mg | Freq: Every day | INTRAVENOUS | Status: DC

## 2018-05-14 MED ORDER — FENTANYL CITRATE (PF) 100 MCG/2ML IJ SOLN
INTRAMUSCULAR | Status: AC
  Filled 2018-05-14: qty 2

## 2018-05-14 MED ORDER — LIDOCAINE 2% (20 MG/ML) 5 ML SYRINGE
INTRAMUSCULAR | Status: DC | PRN
  Administered 2018-05-14: 80 mg via INTRAVENOUS

## 2018-05-14 MED ORDER — ONDANSETRON HCL 4 MG/2ML IJ SOLN
4.0000 mg | Freq: Once | INTRAMUSCULAR | Status: AC
  Administered 2018-05-14: 10:00:00 4 mg via INTRAVENOUS

## 2018-05-14 MED ORDER — HYDROMORPHONE HCL 1 MG/ML IJ SOLN
1.0000 mg | INTRAMUSCULAR | Status: DC | PRN
  Administered 2018-05-14: 10:00:00 1 mg via INTRAVENOUS

## 2018-05-14 MED ORDER — METHOCARBAMOL 500 MG PO TABS
500.0000 mg | ORAL_TABLET | Freq: Four times a day (QID) | ORAL | Status: DC | PRN
  Administered 2018-05-14 – 2018-05-19 (×12): 500 mg via ORAL
  Filled 2018-05-14 (×11): qty 1

## 2018-05-14 MED ORDER — GABAPENTIN 100 MG PO CAPS
100.0000 mg | ORAL_CAPSULE | Freq: Three times a day (TID) | ORAL | Status: DC
  Administered 2018-05-14 – 2018-05-20 (×16): 100 mg via ORAL
  Filled 2018-05-14 (×16): qty 1

## 2018-05-14 MED ORDER — PROPOFOL 10 MG/ML IV BOLUS
INTRAVENOUS | Status: DC | PRN
  Administered 2018-05-14: 200 mg via INTRAVENOUS

## 2018-05-14 MED ORDER — INSULIN ASPART 100 UNIT/ML ~~LOC~~ SOLN
4.0000 [IU] | SUBCUTANEOUS | Status: AC
  Administered 2018-05-14: 13:00:00 4 [IU] via SUBCUTANEOUS

## 2018-05-14 MED ORDER — ROCURONIUM BROMIDE 50 MG/5ML IV SOSY
PREFILLED_SYRINGE | INTRAVENOUS | Status: AC
  Filled 2018-05-14: qty 5

## 2018-05-14 MED ORDER — DIPHENHYDRAMINE HCL 50 MG/ML IJ SOLN
INTRAMUSCULAR | Status: AC
  Filled 2018-05-14: qty 1

## 2018-05-14 MED ORDER — IPRATROPIUM-ALBUTEROL 0.5-2.5 (3) MG/3ML IN SOLN
3.0000 mL | Freq: Four times a day (QID) | RESPIRATORY_TRACT | Status: DC
  Filled 2018-05-14: qty 3

## 2018-05-14 MED ORDER — ONDANSETRON HCL 4 MG/2ML IJ SOLN
INTRAMUSCULAR | Status: DC | PRN
  Administered 2018-05-14 (×2): 4 mg via INTRAVENOUS

## 2018-05-14 MED ORDER — INSULIN ASPART 100 UNIT/ML ~~LOC~~ SOLN
0.0000 [IU] | Freq: Three times a day (TID) | SUBCUTANEOUS | Status: DC
  Administered 2018-05-15: 5 [IU] via SUBCUTANEOUS

## 2018-05-14 MED ORDER — SUCCINYLCHOLINE CHLORIDE 200 MG/10ML IV SOSY
PREFILLED_SYRINGE | INTRAVENOUS | Status: AC
  Filled 2018-05-14: qty 10

## 2018-05-14 MED ORDER — BACITRACIN ZINC 500 UNIT/GM EX OINT
TOPICAL_OINTMENT | CUTANEOUS | Status: AC
  Filled 2018-05-14: qty 28.35

## 2018-05-14 MED ORDER — MORPHINE SULFATE (PF) 2 MG/ML IV SOLN
2.0000 mg | INTRAVENOUS | Status: DC | PRN
  Administered 2018-05-14 – 2018-05-19 (×9): 2 mg via INTRAVENOUS
  Filled 2018-05-14 (×10): qty 1

## 2018-05-14 MED ORDER — TOBRAMYCIN SULFATE 1.2 G IJ SOLR
INTRAMUSCULAR | Status: DC | PRN
  Administered 2018-05-14: 1.2 g
  Administered 2018-05-14: 1.2 g via TOPICAL

## 2018-05-14 MED ORDER — MIDAZOLAM HCL 2 MG/2ML IJ SOLN
INTRAMUSCULAR | Status: AC
  Filled 2018-05-14: qty 2

## 2018-05-14 MED ORDER — CEFAZOLIN SODIUM 1 G IJ SOLR
INTRAMUSCULAR | Status: AC
  Filled 2018-05-14: qty 10

## 2018-05-14 MED ORDER — PROPOFOL 10 MG/ML IV BOLUS
INTRAVENOUS | Status: AC
  Filled 2018-05-14: qty 20

## 2018-05-14 MED ORDER — ONDANSETRON 4 MG PO TBDP: 4.0000 mg | ORAL_TABLET | Freq: Four times a day (QID) | ORAL | Status: DC | PRN

## 2018-05-14 MED ORDER — SCOPOLAMINE 1 MG/3DAYS TD PT72
MEDICATED_PATCH | TRANSDERMAL | Status: AC
  Filled 2018-05-14: qty 1

## 2018-05-14 MED ORDER — IOHEXOL 300 MG/ML  SOLN
100.0000 mL | Freq: Once | INTRAMUSCULAR | Status: AC | PRN
  Administered 2018-05-14: 100 mL via INTRAVENOUS

## 2018-05-14 MED ORDER — CEFAZOLIN SODIUM-DEXTROSE 2-4 GM/100ML-% IV SOLN
2.0000 g | Freq: Once | INTRAVENOUS | Status: AC
  Administered 2018-05-14: 10:00:00 2 g via INTRAVENOUS

## 2018-05-14 MED ORDER — INSULIN ASPART 100 UNIT/ML ~~LOC~~ SOLN
5.0000 [IU] | Freq: Once | SUBCUTANEOUS | Status: AC
  Administered 2018-05-14: 18:00:00 5 [IU] via INTRAVENOUS

## 2018-05-14 MED ORDER — LISINOPRIL 10 MG PO TABS
10.0000 mg | ORAL_TABLET | Freq: Every day | ORAL | Status: DC
  Administered 2018-05-15 – 2018-05-20 (×5): 10 mg via ORAL
  Filled 2018-05-14 (×5): qty 1

## 2018-05-14 MED ORDER — ONDANSETRON HCL 4 MG/2ML IJ SOLN: 4.0000 mg | Freq: Four times a day (QID) | INTRAMUSCULAR | Status: DC | PRN

## 2018-05-14 MED ORDER — INSULIN ASPART 100 UNIT/ML ~~LOC~~ SOLN
SUBCUTANEOUS | Status: AC
  Administered 2018-05-14: 13:00:00 4 [IU] via SUBCUTANEOUS
  Filled 2018-05-14: qty 1

## 2018-05-14 MED ORDER — LACTATED RINGERS IV SOLN
INTRAVENOUS | Status: DC | PRN
  Administered 2018-05-14 (×2): via INTRAVENOUS

## 2018-05-14 SURGICAL SUPPLY — 84 items
APL SKNCLS STERI-STRIP NONHPOA (GAUZE/BANDAGES/DRESSINGS)
BANDAGE ACE 3X5.8 VEL STRL LF (GAUZE/BANDAGES/DRESSINGS) ×3 IMPLANT
BANDAGE ACE 4X5 VEL STRL LF (GAUZE/BANDAGES/DRESSINGS) ×3 IMPLANT
BANDAGE ACE 6X5 VEL STRL LF (GAUZE/BANDAGES/DRESSINGS) ×3 IMPLANT
BANDAGE ELASTIC 4 VELCRO ST LF (GAUZE/BANDAGES/DRESSINGS) ×2 IMPLANT
BENZOIN TINCTURE PRP APPL 2/3 (GAUZE/BANDAGES/DRESSINGS) IMPLANT
BLADE AVERAGE 25MMX9MM (BLADE)
BLADE AVERAGE 25X9 (BLADE) IMPLANT
BLADE CLIPPER SURG (BLADE) ×3 IMPLANT
BLADE SURG 10 STRL SS (BLADE) IMPLANT
BNDG CMPR 9X4 STRL LF SNTH (GAUZE/BANDAGES/DRESSINGS)
BNDG COHESIVE 4X5 TAN STRL (GAUZE/BANDAGES/DRESSINGS) ×3 IMPLANT
BNDG ESMARK 4X9 LF (GAUZE/BANDAGES/DRESSINGS) IMPLANT
BNDG GAUZE ELAST 4 BULKY (GAUZE/BANDAGES/DRESSINGS) ×3 IMPLANT
BRUSH SCRUB SURG 4.25 DISP (MISCELLANEOUS) ×6 IMPLANT
CANISTER WOUND CARE 500ML ATS (WOUND CARE) ×2 IMPLANT
CEMENT HV SMART SET (Cement) ×2 IMPLANT
CHLORAPREP W/TINT 26ML (MISCELLANEOUS) ×3 IMPLANT
CLAMP COMBO MED CLIP-ON SELF (EXFIX) ×12 IMPLANT
CLEANER TIP ELECTROSURG 2X2 (MISCELLANEOUS) ×3 IMPLANT
CLOSURE WOUND 1/2 X4 (GAUZE/BANDAGES/DRESSINGS)
COVER SURGICAL LIGHT HANDLE (MISCELLANEOUS) ×6 IMPLANT
COVER WAND RF STERILE (DRAPES) ×3 IMPLANT
CUFF TOURNIQUET SINGLE 18IN (TOURNIQUET CUFF) IMPLANT
CUFF TOURNIQUET SINGLE 24IN (TOURNIQUET CUFF) IMPLANT
DECANTER SPIKE VIAL GLASS SM (MISCELLANEOUS) IMPLANT
DRAPE C-ARM 42X72 X-RAY (DRAPES) ×3 IMPLANT
DRAPE C-ARMOR (DRAPES) ×3 IMPLANT
DRAPE INCISE IOBAN 66X45 STRL (DRAPES) IMPLANT
DRAPE ORTHO SPLIT 77X108 STRL (DRAPES) ×6
DRAPE SURG ORHT 6 SPLT 77X108 (DRAPES) ×2 IMPLANT
DRAPE U-SHAPE 47X51 STRL (DRAPES) ×3 IMPLANT
DRSG ADAPTIC 3X8 NADH LF (GAUZE/BANDAGES/DRESSINGS) IMPLANT
DRSG VAC ATS MED SENSATRAC (GAUZE/BANDAGES/DRESSINGS) ×2 IMPLANT
ELECT REM PT RETURN 9FT ADLT (ELECTROSURGICAL) ×3
ELECTRODE REM PT RTRN 9FT ADLT (ELECTROSURGICAL) ×1 IMPLANT
GAUZE SPONGE 4X4 12PLY STRL (GAUZE/BANDAGES/DRESSINGS) ×3 IMPLANT
GAUZE XEROFORM 5X9 LF (GAUZE/BANDAGES/DRESSINGS) ×3 IMPLANT
GLOVE BIO SURGEON STRL SZ 6.5 (GLOVE) ×6 IMPLANT
GLOVE BIO SURGEON STRL SZ7.5 (GLOVE) ×12 IMPLANT
GLOVE BIO SURGEONS STRL SZ 6.5 (GLOVE) ×3
GLOVE BIOGEL PI IND STRL 6.5 (GLOVE) ×1 IMPLANT
GLOVE BIOGEL PI IND STRL 7.5 (GLOVE) ×1 IMPLANT
GLOVE BIOGEL PI INDICATOR 6.5 (GLOVE) ×2
GLOVE BIOGEL PI INDICATOR 7.5 (GLOVE) ×2
GOWN STRL REUS W/ TWL LRG LVL3 (GOWN DISPOSABLE) ×2 IMPLANT
GOWN STRL REUS W/TWL LRG LVL3 (GOWN DISPOSABLE) ×6
KIT BASIN OR (CUSTOM PROCEDURE TRAY) ×3 IMPLANT
KIT TURNOVER KIT B (KITS) ×3 IMPLANT
MANIFOLD NEPTUNE II (INSTRUMENTS) ×3 IMPLANT
NS IRRIG 1000ML POUR BTL (IV SOLUTION) ×3 IMPLANT
PACK ORTHO EXTREMITY (CUSTOM PROCEDURE TRAY) ×3 IMPLANT
PAD ARMBOARD 7.5X6 YLW CONV (MISCELLANEOUS) ×6 IMPLANT
PAD CAST 4YDX4 CTTN HI CHSV (CAST SUPPLIES) ×1 IMPLANT
PADDING CAST COTTON 4X4 STRL (CAST SUPPLIES) ×3
PIN SCHANZ 4.0X125MM (PIN) ×8 IMPLANT
ROD CARBON FIBER 8.0MM 240MM (Rod) ×2 IMPLANT
ROD CARBON FIBER 8.0MMX200MM (Rod) ×2 IMPLANT
ROD CARBON FIBER 8.0X160MM (EXFIX) ×2 IMPLANT
SPONGE LAP 18X18 RF (DISPOSABLE) ×6 IMPLANT
STAPLER VISISTAT 35W (STAPLE) ×3 IMPLANT
STRIP CLOSURE SKIN 1/2X4 (GAUZE/BANDAGES/DRESSINGS) IMPLANT
SUCTION FRAZIER HANDLE 10FR (MISCELLANEOUS) ×2
SUCTION TUBE FRAZIER 10FR DISP (MISCELLANEOUS) ×1 IMPLANT
SUT ETHILON 2 0 PSLX (SUTURE) ×6 IMPLANT
SUT MNCRL AB 3-0 PS2 18 (SUTURE) ×3 IMPLANT
SUT MON AB 2-0 CT1 36 (SUTURE) ×3 IMPLANT
SUT PDS AB 2-0 CT1 27 (SUTURE) IMPLANT
SUT PROLENE 0 CT (SUTURE) IMPLANT
SUT PROLENE 1 CT (SUTURE) ×2 IMPLANT
SUT PROLENE 3 0 PS 2 (SUTURE) ×6 IMPLANT
SUT VIC AB 0 CT1 27 (SUTURE) ×6
SUT VIC AB 0 CT1 27XBRD ANBCTR (SUTURE) ×2 IMPLANT
SUT VIC AB 2-0 CT1 27 (SUTURE) ×6
SUT VIC AB 2-0 CT1 TAPERPNT 27 (SUTURE) ×2 IMPLANT
SUT VIC AB 2-0 CT3 27 (SUTURE) IMPLANT
SYR CONTROL 10ML LL (SYRINGE) ×3 IMPLANT
TOWEL OR 17X24 6PK STRL BLUE (TOWEL DISPOSABLE) IMPLANT
TOWEL OR 17X26 10 PK STRL BLUE (TOWEL DISPOSABLE) ×6 IMPLANT
TUBE CONNECTING 12'X1/4 (SUCTIONS) ×1
TUBE CONNECTING 12X1/4 (SUCTIONS) ×2 IMPLANT
UNDERPAD 30X30 (UNDERPADS AND DIAPERS) ×3 IMPLANT
WATER STERILE IRR 1000ML POUR (IV SOLUTION) ×3 IMPLANT
YANKAUER SUCT BULB TIP NO VENT (SUCTIONS) ×3 IMPLANT

## 2018-05-14 NOTE — Progress Notes (Signed)
Report received from Caryn Bee, [place] ED.

## 2018-05-14 NOTE — Anesthesia Procedure Notes (Signed)
Procedure Name: Intubation Date/Time: 05/14/2018 1:55 PM Performed by: Lovie Chol, CRNA Pre-anesthesia Checklist: Patient identified, Emergency Drugs available, Suction available and Patient being monitored Patient Re-evaluated:Patient Re-evaluated prior to induction Oxygen Delivery Method: Circle System Utilized Preoxygenation: Pre-oxygenation with 100% oxygen Induction Type: IV induction, Rapid sequence and Cricoid Pressure applied Laryngoscope Size: Glidescope and 3 Grade View: Grade I Tube type: Oral Tube size: 7.5 mm Number of attempts: 1 Airway Equipment and Method: Stylet and Oral airway Placement Confirmation: ETT inserted through vocal cords under direct vision,  positive ETCO2 and breath sounds checked- equal and bilateral Secured at: 21 cm Tube secured with: Tape Dental Injury: Teeth and Oropharynx as per pre-operative assessment

## 2018-05-14 NOTE — Progress Notes (Signed)
PT Cancellation Note  Patient Details Name: Carrie King MRN: [id number] DOB: 10-10-1955   Cancelled Treatment:    Reason Eval/Treat Not Completed: Patient not medically ready. PT eval received, chart reviewed. Pt currently in OR. Will continue to follow and initiate PT evaluation when pt is available and medically ready to participate.    Marylynn Pearson 05/14/2018, 2:32 PM   Conni Slipper, PT, DPT Acute Rehabilitation Services Pager: [phone] Office: [phone]
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
## 2018-05-14 NOTE — ED Notes (Signed)
Pt brought back from CT by this RN 
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
## 2018-05-14 NOTE — ED Notes (Signed)
Son Rodina Sanmartin [phone]

## 2018-05-14 NOTE — ED Notes (Signed)
Pt taken to CT by this RN. Lab called this RN with Lactic Acid result of 3.0.

## 2018-05-14 NOTE — ED Notes (Signed)
This RN paged ortho to come and place left posterior elbow splint
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
## 2018-05-14 NOTE — ED Notes (Signed)
Pt's 2 sons in lobby given update on patient by this RN.

## 2018-05-14 NOTE — Progress Notes (Signed)
PT B/P 177/107 and when rechecked 151/103 with HR 120-126 sustained. Called MD and received order for lopressor and lisinopril. Given per [facility]

## 2018-05-14 NOTE — ED Notes (Signed)
OR just called this RN to inform me that pt can be brought up to short stay 34. Pt is going to CT then up to the OR.

## 2018-05-14 NOTE — Anesthesia Preprocedure Evaluation (Addendum)
Anesthesia Evaluation  Patient identified by MRN, date of birth, ID band Patient awake    Reviewed: Allergy & Precautions, NPO status , Patient's Chart, lab work & pertinent test results  Airway Mallampati: II  TM Distance: >3 FB Neck ROM: Limited    Dental  (+) Upper Dentures, Poor Dentition, Dental Advisory Given   Pulmonary Current Smoker,    breath sounds clear to auscultation       Cardiovascular hypertension,  Rhythm:Regular Rate:Normal  Patient taking lisinopril. Ran out of med 3 months ago and hasn't gotten it refilled.   Neuro/Psych CVA, No Residual Symptoms    GI/Hepatic   Endo/Other  diabetes, Poorly Controlled, Type 2  Renal/GU      Musculoskeletal   Abdominal   Peds  Hematology   Anesthesia Other Findings   Reproductive/Obstetrics                            Anesthesia Physical Anesthesia Plan  ASA: III  Anesthesia Plan: General   Post-op Pain Management:    Induction: Intravenous  PONV Risk Score and Plan: 2 and Treatment may vary due to age or medical condition and Scopolamine patch - Pre-op  Airway Management Planned: Oral ETT  Additional Equipment:   Intra-op Plan:   Post-operative Plan: Extubation in OR  Informed Consent: I have reviewed the patients History and Physical, chart, labs and discussed the procedure including the risks, benefits and alternatives for the proposed anesthesia with the patient or authorized representative who has indicated his/her understanding and acceptance.     Dental advisory given  Plan Discussed with: CRNA  Anesthesia Plan Comments:         Anesthesia Quick Evaluation
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
## 2018-05-14 NOTE — Anesthesia Postprocedure Evaluation (Signed)
Anesthesia Post Note  Patient: Carrie King  Procedure(s) Performed: IRRIGATION AND DEBRIDEMENT, EXTERNAL FIXATION LEFT ELBOW FRACTURE (Left Elbow)     Patient location during evaluation: PACU Anesthesia Type: General Level of consciousness: awake and alert Pain management: pain level controlled Vital Signs Assessment: post-procedure vital signs reviewed and stable Respiratory status: spontaneous breathing, nonlabored ventilation, respiratory function stable and patient connected to nasal cannula oxygen Cardiovascular status: blood pressure returned to baseline and stable Postop Assessment: no apparent nausea or vomiting Anesthetic complications: no    Last Vitals:  Vitals:   05/14/18 1719 05/14/18 1735  BP: (!) 159/86 (!) 143/83  Pulse: 95 96  Resp: 17 15  Temp:  (!) 36.2 C  SpO2: 92% 94%    Last Pain:  Vitals:   05/14/18 1735  TempSrc:   PainSc: Asleep                 [name]

## 2018-05-14 NOTE — Progress Notes (Signed)
Orthopedic Tech Progress Note Patient Details:  Carrie King [birth year] [id number] Patient is having surgery @12 .. applied a long arm splint with assistance from RN.Gaylord Shih Devices Type of Ortho Device: Long arm splint Ortho Device/Splint Location: ULE Ortho Device/Splint Interventions: Adjustment, Application, Ordered   Post Interventions Patient Tolerated: Well Instructions Provided: Care of device, Adjustment of device   [name] 05/14/2018, 10:28 AM

## 2018-05-14 NOTE — Transfer of Care (Signed)
Immediate Anesthesia Transfer of Care Note  Patient: Carrie King  Procedure(s) Performed: IRRIGATION AND DEBRIDEMENT, EXTERNAL FIXATION LEFT ELBOW FRACTURE (Left Elbow)  Patient Location: PACU  Anesthesia Type:General  Level of Consciousness: awake and alert   Airway & Oxygen Therapy: Patient Spontanous Breathing  Post-op Assessment: Report given to RN and Post -op Vital signs reviewed and stable  Post vital signs: Reviewed and stable  Last Vitals:  Vitals Value Taken Time  BP 134/97 05/14/2018  4:31 PM  Temp    Pulse 103 05/14/2018  4:31 PM  Resp 20 05/14/2018  4:31 PM  SpO2 98 % 05/14/2018  4:31 PM  Vitals shown include unvalidated device data.  Last Pain:  Vitals:   05/14/18 1220  TempSrc: Temporal  PainSc:          Complications: No apparent anesthesia complications

## 2018-05-15 ENCOUNTER — Encounter (HOSPITAL_COMMUNITY): Payer: Self-pay | Admitting: Student

## 2018-05-15 ENCOUNTER — Inpatient Hospital Stay (HOSPITAL_COMMUNITY): Payer: Medicaid Other

## 2018-05-15 LAB — BASIC METABOLIC PANEL
Anion gap: 11 (ref 5–15)
BUN: 14 mg/dL (ref 8–23)
CO2: 21 mmol/L — ABNORMAL LOW (ref 22–32)
Calcium: 8 mg/dL — ABNORMAL LOW (ref 8.9–10.3)
Chloride: 107 mmol/L (ref 98–111)
Creatinine, Ser: 0.75 mg/dL (ref 0.44–1.00)
GFR calc Af Amer: >60 mL/min (ref >60)
GFR calc non Af Amer: >60 mL/min (ref >60)
Glucose, Bld: 264 mg/dL — ABNORMAL HIGH (ref 70–99)
Potassium: 3.7 mmol/L (ref 3.5–5.1)
Sodium: 139 mmol/L (ref 135–145)

## 2018-05-15 LAB — CBC
HCT: 28.6 % — ABNORMAL LOW (ref 36.0–46.0)
Hemoglobin: 9.8 g/dL — ABNORMAL LOW (ref 12.0–15.0)
MCH: 31.6 pg (ref 26.0–34.0)
MCHC: 34.3 g/dL (ref 30.0–36.0)
MCV: 92.3 fL (ref 80.0–100.0)
Platelets: 204 10*3/uL (ref 150–400)
RBC: 3.1 MIL/uL — ABNORMAL LOW (ref 3.87–5.11)
RDW: 12.3 % (ref 11.5–15.5)
WBC: 9.3 10*3/uL (ref 4.0–10.5)
nRBC: 0.2 % (ref 0.0–0.2)

## 2018-05-15 LAB — GLUCOSE, CAPILLARY
Glucose-Capillary: 214 mg/dL — ABNORMAL HIGH (ref 70–99)
Glucose-Capillary: 236 mg/dL — ABNORMAL HIGH (ref 70–99)
Glucose-Capillary: 201 mg/dL — ABNORMAL HIGH (ref 70–99)
Glucose-Capillary: 211 mg/dL — ABNORMAL HIGH (ref 70–99)
Glucose-Capillary: 182 mg/dL — ABNORMAL HIGH (ref 70–99)

## 2018-05-15 LAB — HIV ANTIBODY (ROUTINE TESTING W REFLEX): HIV Screen 4th Generation wRfx: NONREACTIVE
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
## 2018-05-15 MED ORDER — IPRATROPIUM-ALBUTEROL 0.5-2.5 (3) MG/3ML IN SOLN: 3.0000 mL | Freq: Four times a day (QID) | RESPIRATORY_TRACT | Status: DC | PRN

## 2018-05-15 MED ORDER — VENLAFAXINE HCL ER 75 MG PO CP24
75.0000 mg | ORAL_CAPSULE | Freq: Every day | ORAL | Status: DC
  Administered 2018-05-15 – 2018-05-19 (×5): 75 mg via ORAL
  Filled 2018-05-15 (×5): qty 1

## 2018-05-15 MED ORDER — INSULIN GLARGINE 100 UNIT/ML ~~LOC~~ SOLN
14.0000 [IU] | Freq: Every day | SUBCUTANEOUS | Status: DC
  Administered 2018-05-15 – 2018-05-17 (×3): 14 [IU] via SUBCUTANEOUS
  Filled 2018-05-15 (×7): qty 0.14

## 2018-05-15 MED ORDER — LIVING WELL WITH DIABETES BOOK
Freq: Once | Status: AC
  Administered 2018-05-15: 18:00:00

## 2018-05-15 MED ORDER — INSULIN ASPART 100 UNIT/ML ~~LOC~~ SOLN
0.0000 [IU] | Freq: Three times a day (TID) | SUBCUTANEOUS | Status: DC
  Administered 2018-05-15 – 2018-05-16 (×3): 7 [IU] via SUBCUTANEOUS
  Administered 2018-05-16 – 2018-05-17 (×4): 4 [IU] via SUBCUTANEOUS
  Administered 2018-05-17: 17:00:00 3 [IU] via SUBCUTANEOUS
  Administered 2018-05-18: 18:00:00 15 [IU] via SUBCUTANEOUS
  Administered 2018-05-19 – 2018-05-20 (×4): 4 [IU] via SUBCUTANEOUS
  Administered 2018-05-20: 7 [IU] via SUBCUTANEOUS

## 2018-05-15 MED ORDER — INSULIN ASPART 100 UNIT/ML ~~LOC~~ SOLN
0.0000 [IU] | Freq: Every day | SUBCUTANEOUS | Status: DC
  Administered 2018-05-19: 4 [IU] via SUBCUTANEOUS

## 2018-05-15 MED ORDER — TRAMADOL HCL 50 MG PO TABS
50.0000 mg | ORAL_TABLET | Freq: Four times a day (QID) | ORAL | Status: DC | PRN
  Administered 2018-05-15 – 2018-05-17 (×5): 50 mg via ORAL
  Filled 2018-05-15 (×6): qty 1

## 2018-05-15 MED ORDER — ENOXAPARIN SODIUM 40 MG/0.4ML ~~LOC~~ SOLN
40.0000 mg | SUBCUTANEOUS | Status: DC
  Administered 2018-05-15 – 2018-05-17 (×4): 40 mg via SUBCUTANEOUS
  Filled 2018-05-15 (×4): qty 0.4
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
## 2018-05-15 NOTE — Progress Notes (Addendum)
Inpatient Diabetes Program Recommendations  AACE/ADA: New Consensus Statement on Inpatient Glycemic Control (2015)  Target Ranges:  Prepandial:   less than 140 mg/dL      Peak postprandial:   less than 180 mg/dL (1-2 hours)      Critically ill patients:  140 - 180 mg/dL   Lab Results  Component Value Date   GLUCAP 236 (H) 05/15/2018   HGBA1C 9.9 (H) 05/14/2018    Review of Glycemic Control Results for Carrie King, Carrie King (MRN [id number]) as of 05/15/2018 12:18  Ref. Range 05/14/2018 16:26 05/14/2018 17:18 05/14/2018 21:17 05/15/2018 06:49 05/15/2018 11:16  Glucose-Capillary Latest Ref Range: 70 - 99 mg/dL 808 (H) 811 (H) 031 (H) 214 (H) 236 (H)   Diabetes history: DM 2 Outpatient Diabetes medications: None listed Current orders for Inpatient glycemic control:  Novolog resistant tid with meals and HS  Inpatient Diabetes Program Recommendations:    Note A1C indicates average blood sugars of 237.4 for the past 2-3 months.  While in the hospital, consider adding Lantus 14 units daily (0.2 units/kg). A1C indicates uncontrolled DM.  Will attempt to speak with patient by phone today if appropriate regarding A1C.  She will need diabetes medications at d/c as well.  Thanks,  Beryl Meager, RN, BC-ADM Inpatient Diabetes Coordinator Pager [phone]: spoke with patient by phone regarding her DM and glycemic control .  We discussed her current A1C of 9.9%.  Patient states that she was on Metformin in the past but was unable to tolerate it due to nausea and vomiting.  She does not currently have a PCP. She does not check her blood sugars consistently stating, she ran out of strips.  We discussed the [employer] Reli-on glucose meter and strips due to reduced cost.  She admits to drinking regular sodas.  Discussed the benefits of switching to diet sodas.  We discussed the importance of glycemic control especially while in the hospital and after surgery.  Patient verbalized understanding.   Her inability to take Metformin may mean she needs other oral agents such as Amaryl and maybe Actos (both are on the 4$ list).  Will order LWWD booklet for patient as well.

## 2018-05-16 ENCOUNTER — Encounter (HOSPITAL_COMMUNITY): Payer: Self-pay | Admitting: Orthopedic Surgery

## 2018-05-16 DIAGNOSIS — E1165 Type 2 diabetes mellitus with hyperglycemia: Secondary | ICD-10-CM

## 2018-05-16 DIAGNOSIS — E119 Type 2 diabetes mellitus without complications: Secondary | ICD-10-CM | POA: Diagnosis present

## 2018-05-16 DIAGNOSIS — IMO0002 Reserved for concepts with insufficient information to code with codable children: Secondary | ICD-10-CM

## 2018-05-16 HISTORY — DX: Reserved for concepts with insufficient information to code with codable children: IMO0002

## 2018-05-16 HISTORY — DX: Type 2 diabetes mellitus with hyperglycemia: E11.65

## 2018-05-16 LAB — CBC
HCT: 27.4 % — ABNORMAL LOW (ref 36.0–46.0)
Hemoglobin: 8.9 g/dL — ABNORMAL LOW (ref 12.0–15.0)
MCH: 30.3 pg (ref 26.0–34.0)
MCHC: 32.5 g/dL (ref 30.0–36.0)
MCV: 93.2 fL (ref 80.0–100.0)
Platelets: 208 10*3/uL (ref 150–400)
RBC: 2.94 MIL/uL — ABNORMAL LOW (ref 3.87–5.11)
RDW: 12.6 % (ref 11.5–15.5)
WBC: 8.6 10*3/uL (ref 4.0–10.5)
nRBC: 0 % (ref 0.0–0.2)

## 2018-05-16 LAB — BASIC METABOLIC PANEL
Anion gap: 11 (ref 5–15)
BUN: 15 mg/dL (ref 8–23)
CO2: 25 mmol/L (ref 22–32)
Calcium: 8.9 mg/dL (ref 8.9–10.3)
Chloride: 100 mmol/L (ref 98–111)
Creatinine, Ser: 0.71 mg/dL (ref 0.44–1.00)
GFR calc Af Amer: >60 mL/min (ref >60)
GFR calc non Af Amer: >60 mL/min (ref >60)
Glucose, Bld: 215 mg/dL — ABNORMAL HIGH (ref 70–99)
Potassium: 3.5 mmol/L (ref 3.5–5.1)
Sodium: 136 mmol/L (ref 135–145)

## 2018-05-16 LAB — GLUCOSE, CAPILLARY
Glucose-Capillary: 193 mg/dL — ABNORMAL HIGH (ref 70–99)
Glucose-Capillary: 159 mg/dL — ABNORMAL HIGH (ref 70–99)
Glucose-Capillary: 210 mg/dL — ABNORMAL HIGH (ref 70–99)
Glucose-Capillary: 125 mg/dL — ABNORMAL HIGH (ref 70–99)

## 2018-05-16 MED ORDER — VITAMIN C 500 MG PO TABS
500.0000 mg | ORAL_TABLET | Freq: Every day | ORAL | Status: DC
  Administered 2018-05-16 – 2018-05-20 (×4): 500 mg via ORAL
  Filled 2018-05-16 (×4): qty 1

## 2018-05-16 MED ORDER — VITAMIN D 25 MCG (1000 UNIT) PO TABS
2000.0000 [IU] | ORAL_TABLET | Freq: Two times a day (BID) | ORAL | Status: DC
  Administered 2018-05-16 – 2018-05-20 (×8): 2000 [IU] via ORAL
  Filled 2018-05-16 (×8): qty 2
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
## 2018-05-17 ENCOUNTER — Inpatient Hospital Stay (HOSPITAL_COMMUNITY): Payer: Medicaid Other

## 2018-05-17 LAB — CBC
HCT: 25.7 % — ABNORMAL LOW (ref 36.0–46.0)
HCT: 26 % — ABNORMAL LOW (ref 36.0–46.0)
Hemoglobin: 8.3 g/dL — ABNORMAL LOW (ref 12.0–15.0)
Hemoglobin: 8.4 g/dL — ABNORMAL LOW (ref 12.0–15.0)
MCH: 30.2 pg (ref 26.0–34.0)
MCH: 30.5 pg (ref 26.0–34.0)
MCHC: 32.3 g/dL (ref 30.0–36.0)
MCHC: 32.3 g/dL (ref 30.0–36.0)
MCV: 93.5 fL (ref 80.0–100.0)
MCV: 94.5 fL (ref 80.0–100.0)
Platelets: 204 10*3/uL (ref 150–400)
Platelets: 219 10*3/uL (ref 150–400)
RBC: 2.75 MIL/uL — ABNORMAL LOW (ref 3.87–5.11)
RBC: 2.75 MIL/uL — ABNORMAL LOW (ref 3.87–5.11)
RDW: 12.5 % (ref 11.5–15.5)
RDW: 12.6 % (ref 11.5–15.5)
WBC: 7 10*3/uL (ref 4.0–10.5)
WBC: 7.7 10*3/uL (ref 4.0–10.5)
nRBC: 0 % (ref 0.0–0.2)
nRBC: 0 % (ref 0.0–0.2)

## 2018-05-17 LAB — GLUCOSE, CAPILLARY
Glucose-Capillary: 177 mg/dL — ABNORMAL HIGH (ref 70–99)
Glucose-Capillary: 192 mg/dL — ABNORMAL HIGH (ref 70–99)
Glucose-Capillary: 147 mg/dL — ABNORMAL HIGH (ref 70–99)
Glucose-Capillary: 162 mg/dL — ABNORMAL HIGH (ref 70–99)
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
## 2018-05-17 MED ORDER — SODIUM CHLORIDE 0.9 % IV SOLN
2.0000 g | INTRAVENOUS | Status: DC
  Filled 2018-05-17: qty 20

## 2018-05-17 NOTE — Progress Notes (Signed)
Patient ID: Carrie King, Carrie King   DOB: Sep 26, 1955, 63 y.o.   MRN: [id number] [facility] Progress Note:   3 Days Post-Op  Subjective: Mental status is alert;  Sore in chest when she gets up.   Objective: Vital signs in last 24 hours: Temp:  [98.1 F (36.7 C)-99.4 F (37.4 C)] 99.2 F (37.3 C) (04/19 0700) Pulse Rate:  [78-103] 90 (04/19 0700) Resp:  [14-20] 18 (04/19 0700) BP: (121-180)/(63-84) 180/82 (04/19 0700) SpO2:  [92 %-99 %] 99 % (04/19 0700)  Intake/Output from previous day: 04/18 0701 - 04/19 0700 In: 810 [P.O.:120; I.V.:690] Out: 1050 [Urine:1050] Intake/Output this shift: No intake/output data recorded.  Physical Exam: Work of breathing is normal.  Ext fix on left elbow  Lab Results:  Results for orders placed or performed during the hospital encounter of 05/14/18 (from the past 48 hour(s))  Glucose, capillary     Status: Abnormal   Collection Time: 05/15/18 11:16 AM  Result Value Ref Range   Glucose-Capillary 236 (H) 70 - 99 mg/dL  Glucose, capillary     Status: Abnormal   Collection Time: 05/15/18  5:00 PM  Result Value Ref Range   Glucose-Capillary 201 (H) 70 - 99 mg/dL  Glucose, capillary     Status: Abnormal   Collection Time: 05/15/18  7:56 PM  Result Value Ref Range   Glucose-Capillary 211 (H) 70 - 99 mg/dL  Glucose, capillary     Status: Abnormal   Collection Time: 05/15/18  9:33 PM  Result Value Ref Range   Glucose-Capillary 182 (H) 70 - 99 mg/dL   Comment 1 Notify RN    Comment 2 Document in Chart   CBC     Status: Abnormal   Collection Time: 05/16/18  5:43 AM  Result Value Ref Range   WBC 8.6 4.0 - 10.5 K/uL   RBC 2.94 (L) 3.87 - 5.11 MIL/uL   Hemoglobin 8.9 (L) 12.0 - 15.0 g/dL   HCT 40.927.4 (L) 81.136.0 - 91.446.0 %   MCV 93.2 80.0 - 100.0 fL   MCH 30.3 26.0 - 34.0 pg   MCHC 32.5 30.0 - 36.0 g/dL   RDW 78.212.6 95.611.5 - 21.315.5 %   Platelets 208 150 - 400 K/uL   nRBC 0.0 0.0 - 0.2 %    Comment: Performed at [facility], [address] [place], [place] [address]  Basic metabolic panel     Status: Abnormal   Collection Time: 05/16/18  5:43 AM  Result Value Ref Range   Sodium 136 135 - 145 mmol/L   Potassium 3.5 3.5 - 5.1 mmol/L   Chloride 100 98 - 111 mmol/L   CO2 25 22 - 32 mmol/L   Glucose, Bld 215 (H) 70 - 99 mg/dL   BUN 15 8 - 23 mg/dL   Creatinine, Ser 8.460.71 0.44 - 1.00 mg/dL   Calcium 8.9 8.9 - 96.210.3 mg/dL   GFR calc non Af Amer >60 >60 mL/min   GFR calc Af Amer >60 >60 mL/min   Anion gap 11 5 - 15    Comment: Performed at [facility], [address] [place], [place] [address]  Glucose, capillary     Status: Abnormal   Collection Time: 05/16/18  7:13 AM  Result Value Ref Range   Glucose-Capillary 193 (H) 70 - 99 mg/dL   Comment 1 Notify RN    Comment 2 Document in Chart   Glucose, capillary     Status: Abnormal  Collection Time: 05/16/18 12:47 PM  Result Value Ref Range   Glucose-Capillary 159 (H) 70 - 99 mg/dL  Glucose, capillary     Status: Abnormal   Collection Time: 05/16/18  4:48 PM  Result Value Ref Range   Glucose-Capillary 210 (H) 70 - 99 mg/dL  Glucose, capillary     Status: Abnormal   Collection Time: 05/16/18  9:07 PM  Result Value Ref Range   Glucose-Capillary 125 (H) 70 - 99 mg/dL   Comment 1 Notify RN    Comment 2 Document in Chart   CBC     Status: Abnormal   Collection Time: 05/17/18  6:24 AM  Result Value Ref Range   WBC 7.0 4.0 - 10.5 K/uL   RBC 2.75 (L) 3.87 - 5.11 MIL/uL   Hemoglobin 8.3 (L) 12.0 - 15.0 g/dL   HCT 49.7 (L) 02.6 - 37.8 %   MCV 93.5 80.0 - 100.0 fL   MCH 30.2 26.0 - 34.0 pg   MCHC 32.3 30.0 - 36.0 g/dL   RDW 58.8 50.2 - 77.4 %   Platelets 204 150 - 400 K/uL   nRBC 0.0 0.0 - 0.2 %    Comment: Performed at [facility], [address], [place], [place] [address]  Glucose, capillary     Status: Abnormal   Collection Time: 05/17/18  7:25 AM  Result Value Ref Range   Glucose-Capillary 177 (H) 70 - 99 mg/dL   Comment 1 Notify RN    Comment 2  Document in Chart     Radiology/Results: No results found.  Anti-infectives: Anti-infectives (From admission, onward)   Start     Dose/Rate Route Frequency Ordered Stop   05/14/18 2200  cefTRIAXone (ROCEPHIN) 2 g in sodium chloride 0.9 % 100 mL IVPB     2 g 200 mL/hr over 30 Minutes Intravenous Every 24 hours 05/14/18 2039 05/16/18 2234   05/14/18 1800  ceFAZolin (ANCEF) IVPB 1 g/50 mL premix  Status:  Discontinued     1 g 100 mL/hr over 30 Minutes Intravenous Every 8 hours 05/14/18 1223 05/14/18 2039   05/14/18 1437  vancomycin (VANCOCIN) powder  Status:  Discontinued       As needed 05/14/18 1437 05/14/18 1614   05/14/18 1437  tobramycin (NEBCIN) powder  Status:  Discontinued       As needed 05/14/18 1438 05/14/18 1614   05/14/18 0945  ceFAZolin (ANCEF) IVPB 1 g/50 mL premix  Status:  Discontinued     1 g 100 mL/hr over 30 Minutes Intravenous  Once 05/14/18 0930 05/14/18 0934   05/14/18 0945  ceFAZolin (ANCEF) IVPB 2g/100 mL premix     2 g 200 mL/hr over 30 Minutes Intravenous  Once 05/14/18 0934 05/14/18 1009      Assessment/Plan: Problem List: Patient Active Problem List   Diagnosis Date Noted  . Uncontrolled diabetes mellitus (HCC) 05/16/2018  . Open bicondylar fracture of distal humerus, left, initial encounter 05/14/2018  . Left elbow fracture 05/14/2018  . Open Monteggia's fracture of left ulna, type IIIA, IIIB, or IIIC 05/14/2018    For ortho take back tomorrow.  Taking diet.  Stable 3 Days Post-Op    LOS: 3 days   Matt B. Daphine Deutscher, MD, [facility] [phone] beeper [phone]  05/17/2018 9:16 AM
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
## 2018-05-18 ENCOUNTER — Inpatient Hospital Stay (HOSPITAL_COMMUNITY): Payer: Medicaid Other

## 2018-05-18 ENCOUNTER — Encounter (HOSPITAL_COMMUNITY): Admission: EM | Disposition: A | Discharge status: Home Health | Payer: Self-pay | Source: Home / Self Care

## 2018-05-18 ENCOUNTER — Inpatient Hospital Stay (HOSPITAL_COMMUNITY): Payer: Medicaid Other | Admitting: Anesthesiology

## 2018-05-18 ENCOUNTER — Encounter (HOSPITAL_COMMUNITY): Payer: Self-pay | Admitting: Certified Registered Nurse Anesthetist

## 2018-05-18 HISTORY — PX: ORIF HUMERUS FRACTURE: SHX2126

## 2018-05-18 HISTORY — PX: I & D EXTREMITY: SHX5045

## 2018-05-18 HISTORY — PX: ORIF ULNAR FRACTURE: SHX5417

## 2018-05-18 LAB — CBC
HCT: 24.1 % — ABNORMAL LOW (ref 36.0–46.0)
Hemoglobin: 7.8 g/dL — ABNORMAL LOW (ref 12.0–15.0)
MCH: 30.6 pg (ref 26.0–34.0)
MCHC: 32.4 g/dL (ref 30.0–36.0)
MCV: 94.5 fL (ref 80.0–100.0)
Platelets: 237 10*3/uL (ref 150–400)
RBC: 2.55 MIL/uL — ABNORMAL LOW (ref 3.87–5.11)
RDW: 12.7 % (ref 11.5–15.5)
WBC: 5.2 10*3/uL (ref 4.0–10.5)
nRBC: 0 % (ref 0.0–0.2)

## 2018-05-18 LAB — GLUCOSE, CAPILLARY
Glucose-Capillary: 239 mg/dL — ABNORMAL HIGH (ref 70–99)
Glucose-Capillary: 301 mg/dL — ABNORMAL HIGH (ref 70–99)
Glucose-Capillary: 85 mg/dL (ref 70–99)

## 2018-05-18 LAB — VITAMIN D 25 HYDROXY (VIT D DEFICIENCY, FRACTURES): Vit D, 25-Hydroxy: 11.8 ng/mL — ABNORMAL LOW (ref 30.0–100.0)

## 2018-05-18 LAB — ABO/RH: ABO/RH(D): A POS

## 2018-05-18 LAB — PREPARE RBC (CROSSMATCH)

## 2018-05-18 IMAGING — DX LEFT ELBOW - 2 VIEW
3 series · 3 of 3 positions shown · non-contrast
Comparison: Intraoperative x-rays earlier same day at [DATE] a.m.
LEFT elbow x-rays and CT [DATE].

CLINICAL DATA: Postop day 0 ORIF severely comminuted LEFT humerus
and ulnar fractures

EXAM:
LEFT ELBOW - 2 VIEW

[elbow ap (1 of 2)]
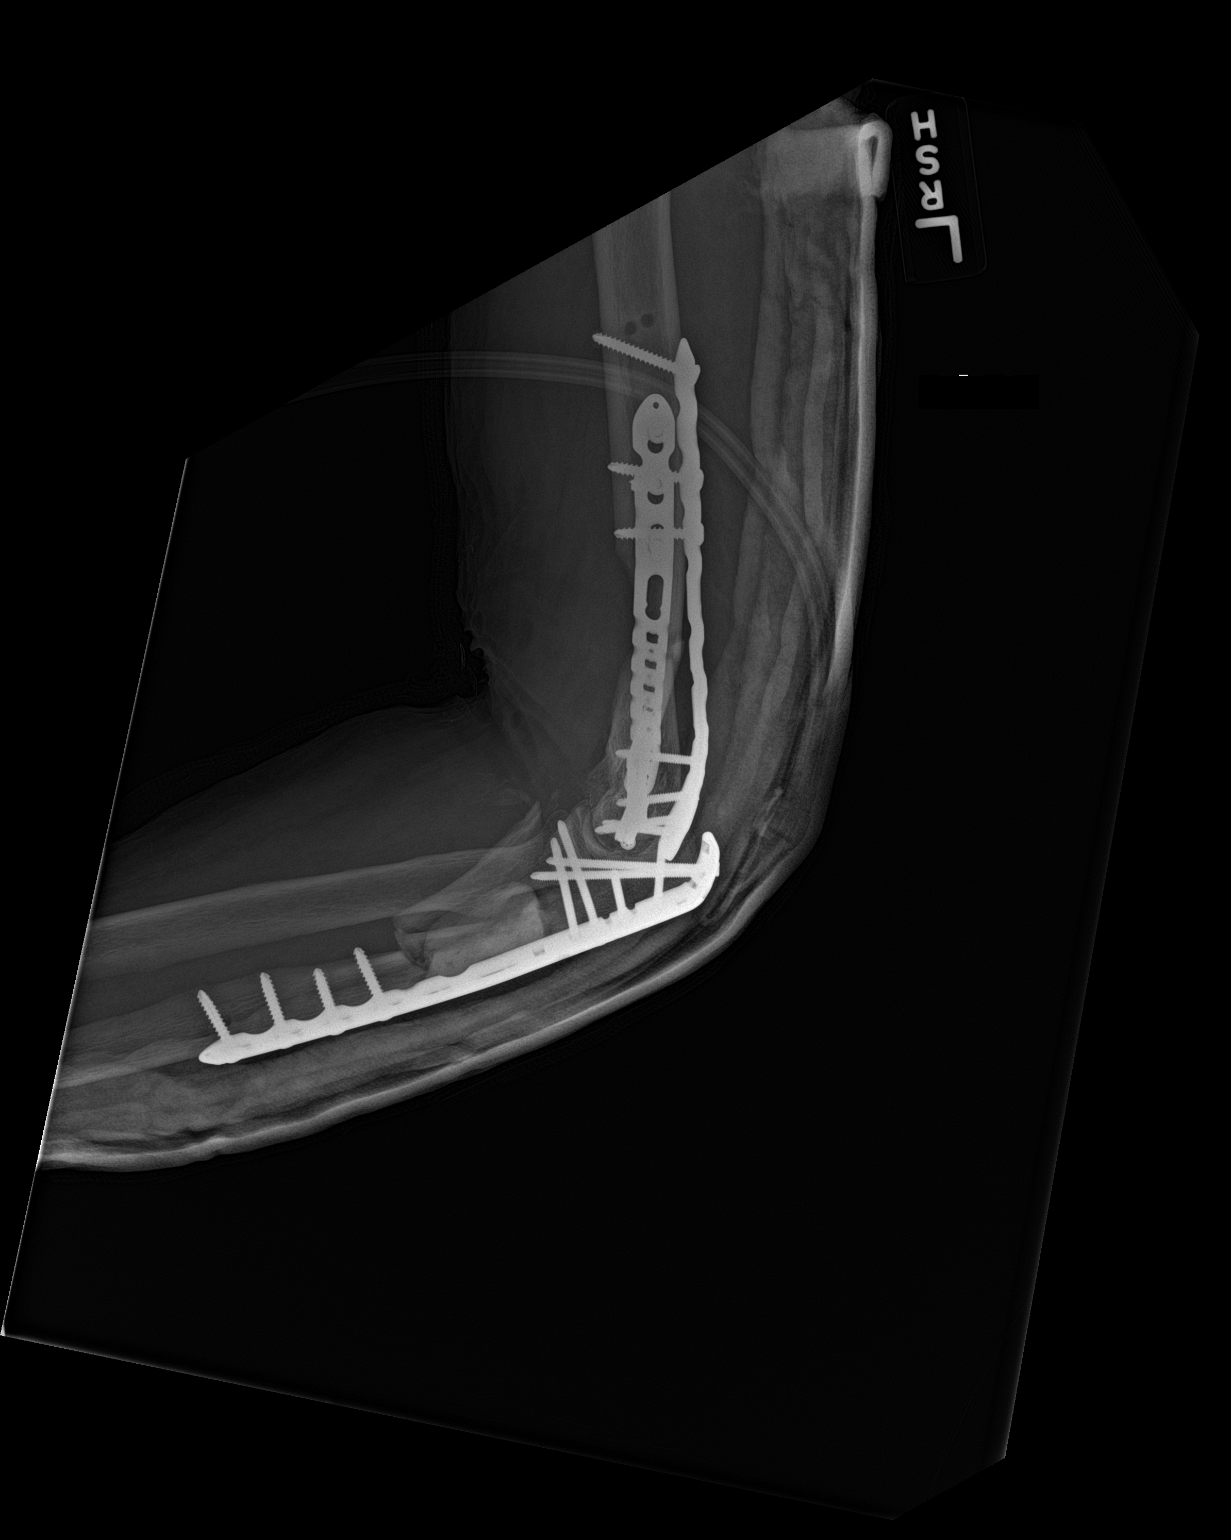

[elbow lat]
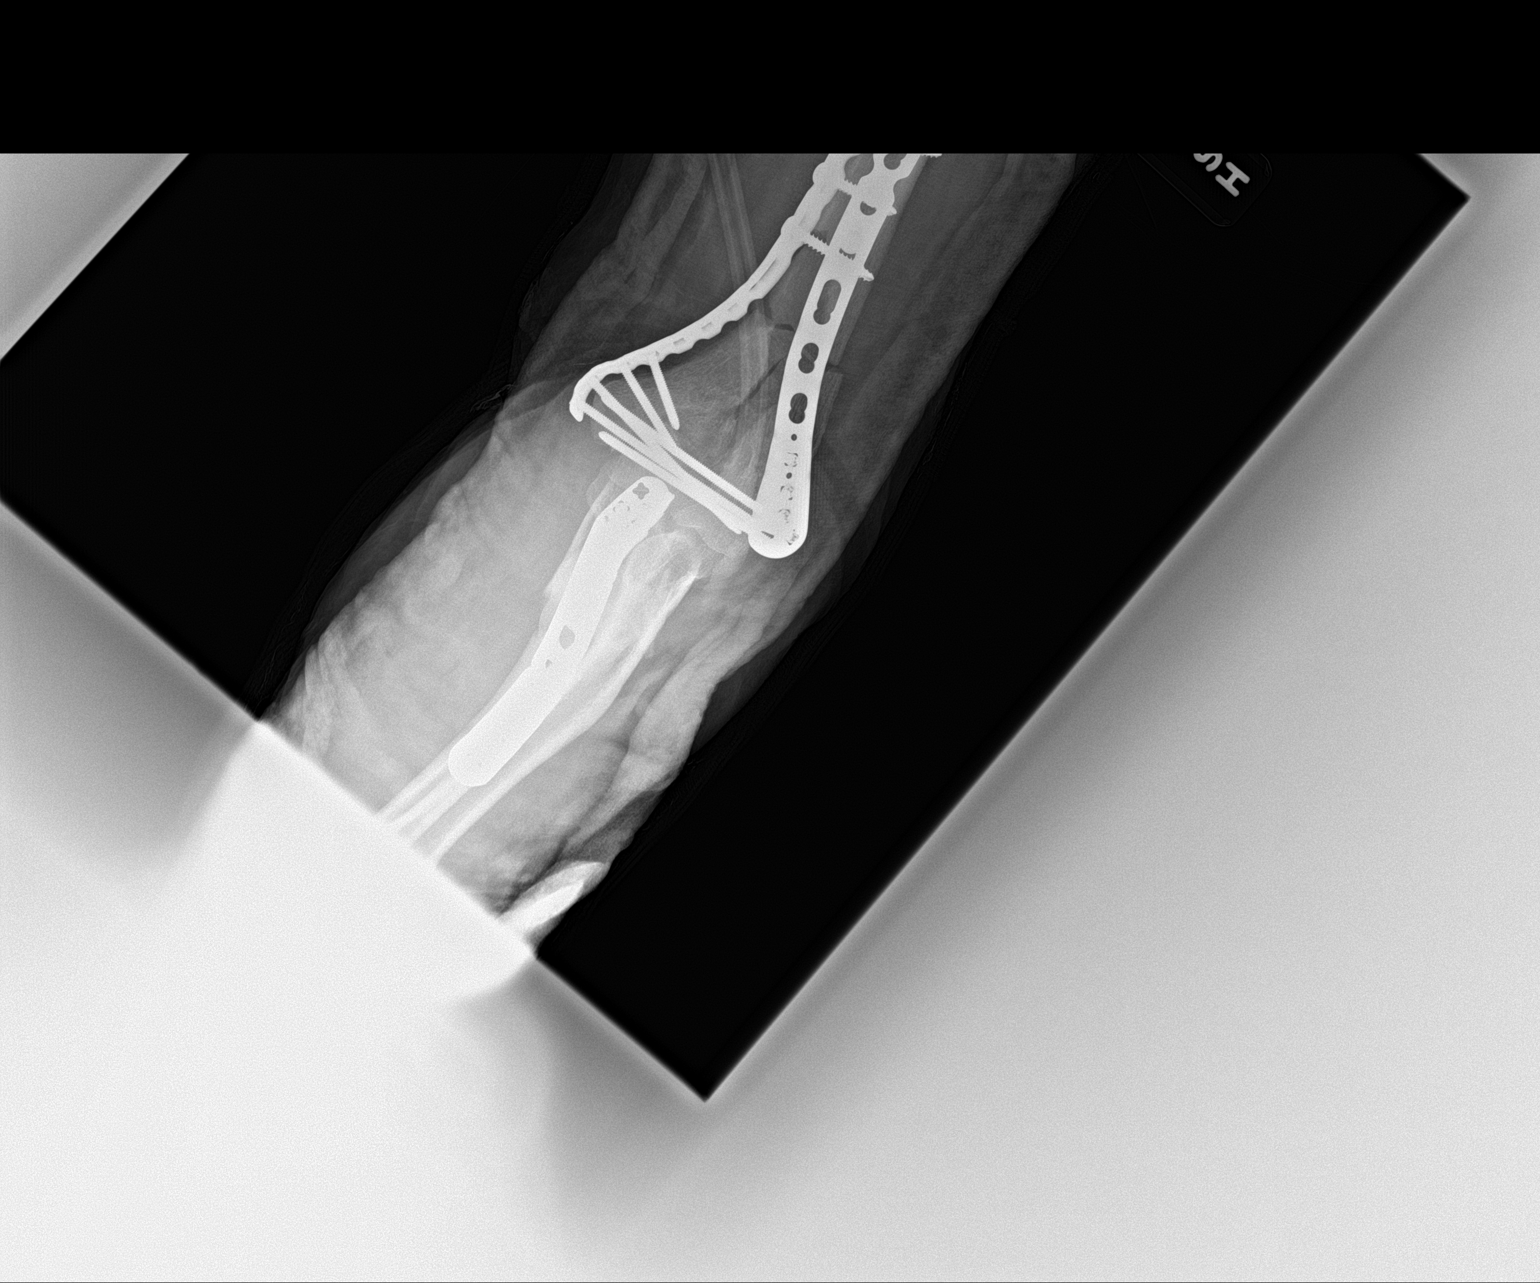

[elbow ap (2 of 2)]
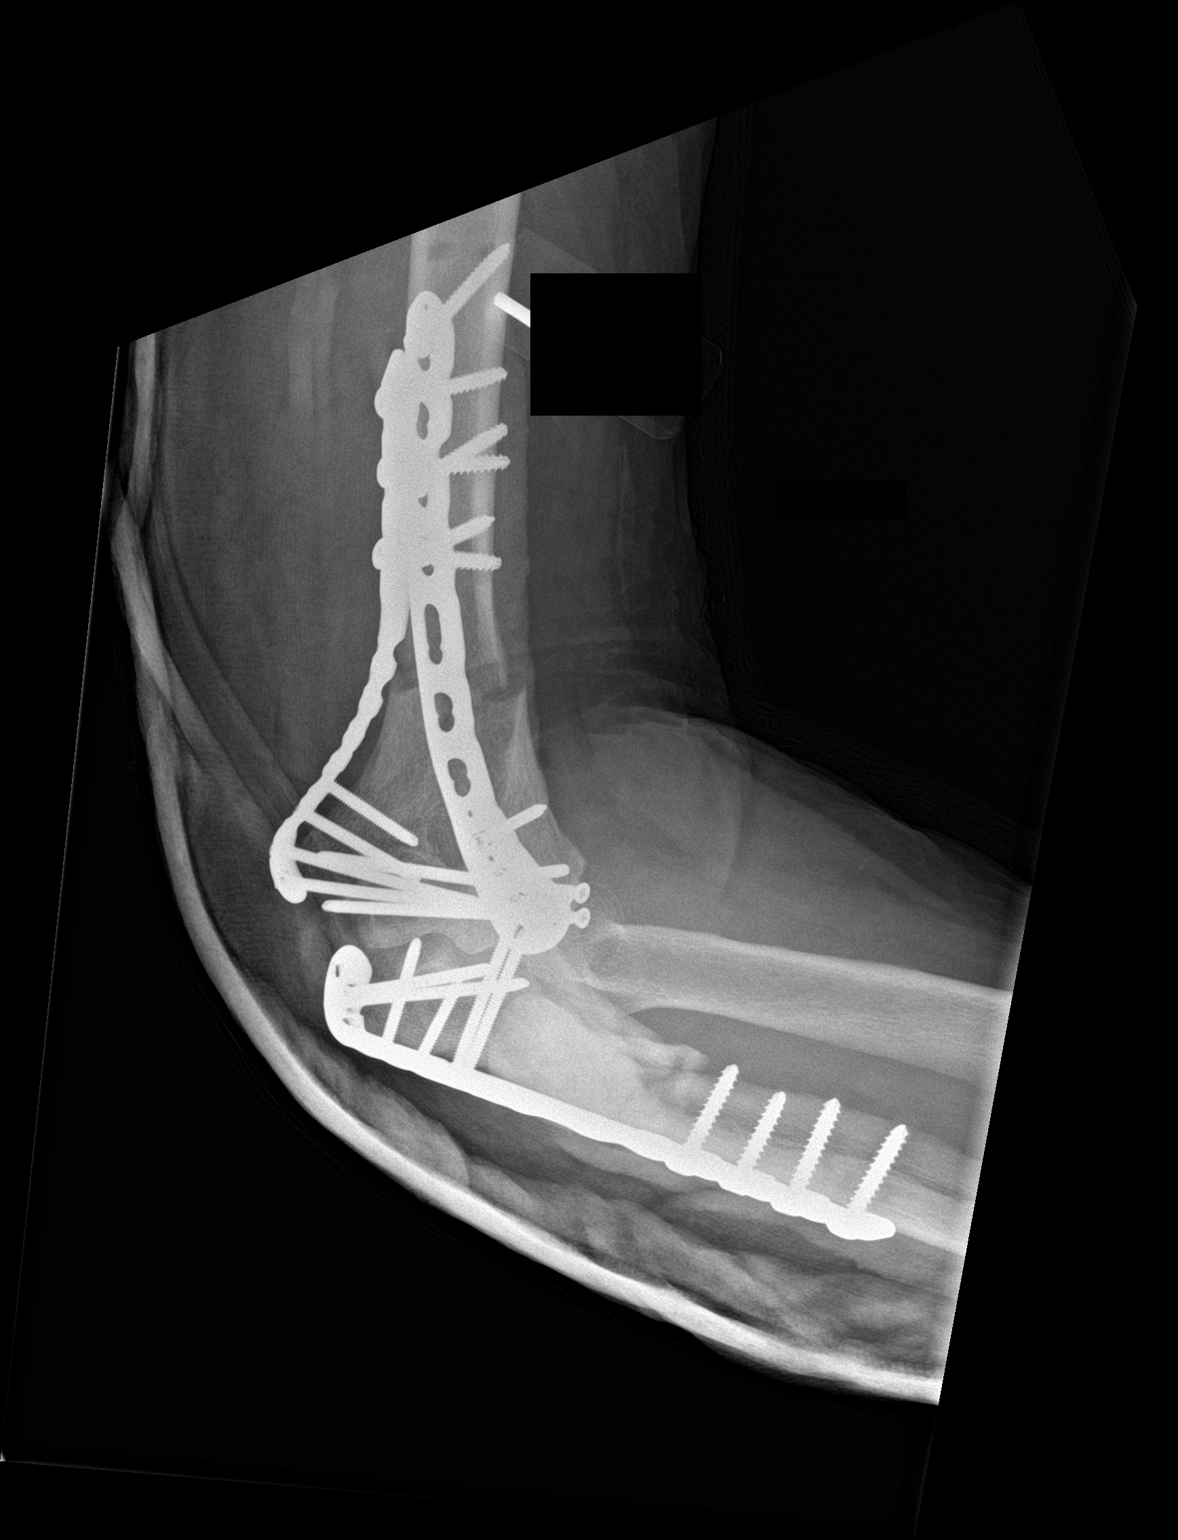

[3 of 3 positions shown; findings below may reference images not displayed]

FINDINGS: Examination is performed in plaster splint material. ORIF of the
comminuted distal humerus fracture with near anatomic alignment.
ORIF of the comminuted ulnar fracture with a free fragment
positioned volar to the remainder of the ulna as noted on the
intraoperative images. The humeroulnar joint and humeroradial joint
appear anatomically aligned.
IMPRESSION: Near anatomic alignment post ORIF of the comminuted distal humerus
and ulnar fractures. As noted on the intraoperative images, there is
a free fragment position volar to the remainder of the ulna. The
elbow joint appears anatomically aligned on these images.

## 2018-05-18 SURGERY — IRRIGATION AND DEBRIDEMENT EXTREMITY
Anesthesia: General | Site: Arm Upper | Laterality: Left

## 2018-05-18 MED ORDER — MIDAZOLAM HCL 5 MG/5ML IJ SOLN
INTRAMUSCULAR | Status: DC | PRN
  Administered 2018-05-18: 2 mg via INTRAVENOUS

## 2018-05-18 MED ORDER — CEFAZOLIN SODIUM 1 G IJ SOLR
INTRAMUSCULAR | Status: AC
  Filled 2018-05-18: qty 20

## 2018-05-18 MED ORDER — CEFAZOLIN SODIUM-DEXTROSE 2-4 GM/100ML-% IV SOLN
2.0000 g | Freq: Three times a day (TID) | INTRAVENOUS | Status: DC
  Filled 2018-05-18 (×3): qty 100

## 2018-05-18 MED ORDER — VANCOMYCIN HCL 1000 MG IV SOLR
INTRAVENOUS | Status: DC | PRN
  Administered 2018-05-18 (×2): 1000 mg

## 2018-05-18 MED ORDER — LIDOCAINE 2% (20 MG/ML) 5 ML SYRINGE
INTRAMUSCULAR | Status: AC
  Filled 2018-05-18: qty 5

## 2018-05-18 MED ORDER — DEXAMETHASONE SODIUM PHOSPHATE 10 MG/ML IJ SOLN
INTRAMUSCULAR | Status: AC
  Filled 2018-05-18: qty 1

## 2018-05-18 MED ORDER — FENTANYL CITRATE (PF) 100 MCG/2ML IJ SOLN: 25.0000 ug | INTRAMUSCULAR | Status: DC | PRN

## 2018-05-18 MED ORDER — ROCURONIUM BROMIDE 50 MG/5ML IV SOSY
PREFILLED_SYRINGE | INTRAVENOUS | Status: AC
  Filled 2018-05-18: qty 10

## 2018-05-18 MED ORDER — ENOXAPARIN SODIUM 40 MG/0.4ML ~~LOC~~ SOLN
40.0000 mg | SUBCUTANEOUS | Status: DC
  Administered 2018-05-19 – 2018-05-20 (×2): 40 mg via SUBCUTANEOUS
  Filled 2018-05-18 (×2): qty 0.4

## 2018-05-18 MED ORDER — OXYCODONE HCL 5 MG/5ML PO SOLN: 5.0000 mg | Freq: Once | ORAL | Status: DC | PRN

## 2018-05-18 MED ORDER — HYDROMORPHONE HCL 1 MG/ML IJ SOLN
1.0000 mg | Freq: Once | INTRAMUSCULAR | Status: AC
  Administered 2018-05-18: 1 mg via INTRAVENOUS
  Filled 2018-05-18: qty 1

## 2018-05-18 MED ORDER — SUCCINYLCHOLINE CHLORIDE 20 MG/ML IJ SOLN
INTRAMUSCULAR | Status: DC | PRN
  Administered 2018-05-18: 100 mg via INTRAVENOUS

## 2018-05-18 MED ORDER — OXYCODONE HCL 5 MG PO TABS: 5.0000 mg | ORAL_TABLET | Freq: Once | ORAL | Status: DC | PRN

## 2018-05-18 MED ORDER — ONDANSETRON HCL 4 MG/2ML IJ SOLN: 4.0000 mg | Freq: Once | INTRAMUSCULAR | Status: DC | PRN

## 2018-05-18 MED ORDER — CEFAZOLIN SODIUM-DEXTROSE 2-3 GM-%(50ML) IV SOLR
INTRAVENOUS | Status: DC | PRN
  Administered 2018-05-18 (×2): 2 g via INTRAVENOUS

## 2018-05-18 MED ORDER — TOBRAMYCIN SULFATE 1.2 G IJ SOLR
INTRAMUSCULAR | Status: AC
  Filled 2018-05-18: qty 1.2

## 2018-05-18 MED ORDER — EPHEDRINE 5 MG/ML INJ
INTRAVENOUS | Status: AC
  Filled 2018-05-18: qty 10

## 2018-05-18 MED ORDER — ROCURONIUM BROMIDE 100 MG/10ML IV SOLN
INTRAVENOUS | Status: DC | PRN
  Administered 2018-05-18: 50 mg via INTRAVENOUS
  Administered 2018-05-18: 30 mg via INTRAVENOUS
  Administered 2018-05-18: 50 mg via INTRAVENOUS

## 2018-05-18 MED ORDER — PROPOFOL 10 MG/ML IV BOLUS
INTRAVENOUS | Status: AC
  Filled 2018-05-18: qty 20

## 2018-05-18 MED ORDER — SODIUM CHLORIDE 0.9 % IR SOLN
Status: DC | PRN
  Administered 2018-05-18 (×2): 3000 mL

## 2018-05-18 MED ORDER — BACITRACIN 500 UNIT/GM EX OINT
TOPICAL_OINTMENT | CUTANEOUS | Status: DC | PRN
  Administered 2018-05-18: 1 via TOPICAL

## 2018-05-18 MED ORDER — BACITRACIN ZINC 500 UNIT/GM EX OINT
TOPICAL_OINTMENT | CUTANEOUS | Status: AC
  Filled 2018-05-18: qty 28.35

## 2018-05-18 MED ORDER — CLONIDINE HCL (ANALGESIA) 100 MCG/ML EP SOLN
EPIDURAL | Status: DC | PRN
  Administered 2018-05-18: 100 ug

## 2018-05-18 MED ORDER — EPHEDRINE SULFATE 50 MG/ML IJ SOLN
INTRAMUSCULAR | Status: DC | PRN
  Administered 2018-05-18 (×2): 10 mg via INTRAVENOUS
  Administered 2018-05-18: 20 mg via INTRAVENOUS
  Administered 2018-05-18: 10 mg via INTRAVENOUS

## 2018-05-18 MED ORDER — PROPOFOL 10 MG/ML IV BOLUS
INTRAVENOUS | Status: DC | PRN
  Administered 2018-05-18: 150 mg via INTRAVENOUS

## 2018-05-18 MED ORDER — FENTANYL CITRATE (PF) 100 MCG/2ML IJ SOLN
INTRAMUSCULAR | Status: AC
  Filled 2018-05-18: qty 2

## 2018-05-18 MED ORDER — MIDAZOLAM HCL 2 MG/2ML IJ SOLN
INTRAMUSCULAR | Status: AC
  Filled 2018-05-18: qty 2

## 2018-05-18 MED ORDER — SODIUM CHLORIDE 0.9 % IV SOLN
INTRAVENOUS | Status: DC | PRN
  Administered 2018-05-18: 08:00:00 50 ug/min via INTRAVENOUS

## 2018-05-18 MED ORDER — CEFAZOLIN SODIUM-DEXTROSE 2-4 GM/100ML-% IV SOLN
2.0000 g | Freq: Three times a day (TID) | INTRAVENOUS | Status: AC
  Administered 2018-05-18 – 2018-05-19 (×3): 2 g via INTRAVENOUS
  Filled 2018-05-18 (×3): qty 100

## 2018-05-18 MED ORDER — ONDANSETRON HCL 4 MG/2ML IJ SOLN
INTRAMUSCULAR | Status: DC | PRN
  Administered 2018-05-18: 4 mg via INTRAVENOUS

## 2018-05-18 MED ORDER — 0.9 % SODIUM CHLORIDE (POUR BTL) OPTIME
TOPICAL | Status: DC | PRN
  Administered 2018-05-18: 08:00:00 1000 mL

## 2018-05-18 MED ORDER — BUPIVACAINE HCL (PF) 0.25 % IJ SOLN: INTRAMUSCULAR | Status: DC | PRN

## 2018-05-18 MED ORDER — SUGAMMADEX SODIUM 200 MG/2ML IV SOLN
INTRAVENOUS | Status: DC | PRN
  Administered 2018-05-18: 150 mg via INTRAVENOUS

## 2018-05-18 MED ORDER — TOBRAMYCIN SULFATE 1.2 G IJ SOLR
INTRAMUSCULAR | Status: DC | PRN
  Administered 2018-05-18 (×2): 1.2 g

## 2018-05-18 MED ORDER — LIDOCAINE HCL (CARDIAC) PF 100 MG/5ML IV SOSY
PREFILLED_SYRINGE | INTRAVENOUS | Status: DC | PRN
  Administered 2018-05-18: 60 mg via INTRAVENOUS

## 2018-05-18 MED ORDER — FENTANYL CITRATE (PF) 100 MCG/2ML IJ SOLN
INTRAMUSCULAR | Status: DC | PRN
  Administered 2018-05-18: 50 ug via INTRAVENOUS
  Administered 2018-05-18: 150 ug via INTRAVENOUS
  Administered 2018-05-18: 50 ug via INTRAVENOUS

## 2018-05-18 MED ORDER — PHENYLEPHRINE 40 MCG/ML (10ML) SYRINGE FOR IV PUSH (FOR BLOOD PRESSURE SUPPORT)
PREFILLED_SYRINGE | INTRAVENOUS | Status: AC
  Filled 2018-05-18: qty 10

## 2018-05-18 MED ORDER — DEXAMETHASONE SODIUM PHOSPHATE 10 MG/ML IJ SOLN
INTRAMUSCULAR | Status: DC | PRN
  Administered 2018-05-18: 5 mg via INTRAVENOUS

## 2018-05-18 MED ORDER — VANCOMYCIN HCL 1000 MG IV SOLR
INTRAVENOUS | Status: AC
  Filled 2018-05-18: qty 1000

## 2018-05-18 MED ORDER — LACTATED RINGERS IV SOLN
INTRAVENOUS | Status: DC | PRN
  Administered 2018-05-18 (×2): via INTRAVENOUS

## 2018-05-18 MED ORDER — PHENYLEPHRINE HCL (PRESSORS) 10 MG/ML IV SOLN
INTRAVENOUS | Status: DC | PRN
  Administered 2018-05-18 (×4): 80 ug via INTRAVENOUS

## 2018-05-18 MED ORDER — METHYLENE BLUE 0.5 % INJ SOLN
INTRAVENOUS | Status: DC | PRN
  Administered 2018-05-18: 1 mL

## 2018-05-18 MED ORDER — METHYLENE BLUE 0.5 % INJ SOLN
INTRAVENOUS | Status: AC
  Filled 2018-05-18: qty 10

## 2018-05-18 MED ORDER — FENTANYL CITRATE (PF) 250 MCG/5ML IJ SOLN
INTRAMUSCULAR | Status: AC
  Filled 2018-05-18: qty 5

## 2018-05-18 MED ORDER — BUPIVACAINE HCL (PF) 0.25 % IJ SOLN
INTRAMUSCULAR | Status: AC
  Filled 2018-05-18: qty 30

## 2018-05-18 MED ORDER — SUCCINYLCHOLINE CHLORIDE 200 MG/10ML IV SOSY
PREFILLED_SYRINGE | INTRAVENOUS | Status: AC
  Filled 2018-05-18: qty 10

## 2018-05-18 MED ORDER — ROPIVACAINE HCL 5 MG/ML IJ SOLN
INTRAMUSCULAR | Status: DC | PRN
  Administered 2018-05-18: 30 mL via PERINEURAL

## 2018-05-18 MED ORDER — ONDANSETRON HCL 4 MG/2ML IJ SOLN
INTRAMUSCULAR | Status: AC
  Filled 2018-05-18: qty 2

## 2018-05-18 SURGICAL SUPPLY — 130 items
BANDAGE ACE 3X5.8 VEL STRL LF (GAUZE/BANDAGES/DRESSINGS) ×1 IMPLANT
BANDAGE ACE 4X5 VEL STRL LF (GAUZE/BANDAGES/DRESSINGS) ×1 IMPLANT
BANDAGE ELASTIC 4 VELCRO ST LF (GAUZE/BANDAGES/DRESSINGS) ×2 IMPLANT
BANDAGE ELASTIC 6 VELCRO ST LF (GAUZE/BANDAGES/DRESSINGS) ×2 IMPLANT
BIT DRILL 2.5X110 QC LCP DISP (BIT) ×2 IMPLANT
BIT DRILL LCP QC 2X140 (BIT) ×4 IMPLANT
BLADE AVERAGE 25MMX9MM (BLADE)
BLADE AVERAGE 25X9 (BLADE) IMPLANT
BLADE CLIPPER SURG (BLADE) IMPLANT
BNDG CMPR 9X4 STRL LF SNTH (GAUZE/BANDAGES/DRESSINGS) ×1
BNDG COHESIVE 4X5 TAN STRL (GAUZE/BANDAGES/DRESSINGS) ×5 IMPLANT
BNDG ESMARK 4X9 LF (GAUZE/BANDAGES/DRESSINGS) ×3 IMPLANT
BNDG GAUZE ELAST 4 BULKY (GAUZE/BANDAGES/DRESSINGS) ×4 IMPLANT
BRUSH SCRUB EZ  4% CHG (MISCELLANEOUS) ×4
BRUSH SCRUB EZ 4% CHG (MISCELLANEOUS) IMPLANT
BRUSH SCRUB SURG 4.25 DISP (MISCELLANEOUS) ×2 IMPLANT
CANISTER WOUNDNEG PRESSURE 500 (CANNISTER) ×2 IMPLANT
CEMENT HV SMART SET (Cement) ×2 IMPLANT
CHLORAPREP W/TINT 26ML (MISCELLANEOUS) ×3 IMPLANT
CORDS BIPOLAR (ELECTRODE) ×3 IMPLANT
COVER MAYO STAND STRL (DRAPES) ×3 IMPLANT
COVER SURGICAL LIGHT HANDLE (MISCELLANEOUS) ×6 IMPLANT
COVER WAND RF STERILE (DRAPES) ×3 IMPLANT
CUFF TOURNIQUET SINGLE 18IN (TOURNIQUET CUFF) IMPLANT
CUFF TOURNIQUET SINGLE 24IN (TOURNIQUET CUFF) IMPLANT
DECANTER SPIKE VIAL GLASS SM (MISCELLANEOUS) ×3 IMPLANT
DRAIN PENROSE 1/4X12 LTX STRL (WOUND CARE) IMPLANT
DRAPE C-ARM 35X43 STRL (DRAPES) IMPLANT
DRAPE C-ARM 42X72 X-RAY (DRAPES) ×2 IMPLANT
DRAPE C-ARMOR (DRAPES) IMPLANT
DRAPE HALF SHEET 40X57 (DRAPES) ×3 IMPLANT
DRAPE INCISE IOBAN 66X45 STRL (DRAPES) IMPLANT
DRAPE ORTHO SPLIT 77X108 STRL (DRAPES) ×3
DRAPE SURG 17X23 STRL (DRAPES) ×3 IMPLANT
DRAPE SURG ORHT 6 SPLT 77X108 (DRAPES) ×1 IMPLANT
DRAPE U-SHAPE 47X51 STRL (DRAPES) ×6 IMPLANT
DRSG ADAPTIC 3X8 NADH LF (GAUZE/BANDAGES/DRESSINGS) ×5 IMPLANT
DRSG PAD ABDOMINAL 8X10 ST (GAUZE/BANDAGES/DRESSINGS) ×3 IMPLANT
DRSG VAC ATS MED SENSATRAC (GAUZE/BANDAGES/DRESSINGS) ×2 IMPLANT
ELECT REM PT RETURN 9FT ADLT (ELECTROSURGICAL) ×3
ELECTRODE REM PT RTRN 9FT ADLT (ELECTROSURGICAL) ×1 IMPLANT
EVACUATOR 1/8 PVC DRAIN (DRAIN) IMPLANT
GAUZE SPONGE 4X4 12PLY STRL (GAUZE/BANDAGES/DRESSINGS) ×6 IMPLANT
GAUZE SPONGE 4X4 12PLY STRL LF (GAUZE/BANDAGES/DRESSINGS) ×2 IMPLANT
GAUZE XEROFORM 1X8 LF (GAUZE/BANDAGES/DRESSINGS) ×3 IMPLANT
GLOVE BIO SURGEON STRL SZ 6.5 (GLOVE) ×6 IMPLANT
GLOVE BIO SURGEON STRL SZ7.5 (GLOVE) ×12 IMPLANT
GLOVE BIO SURGEONS STRL SZ 6.5 (GLOVE) ×3
GLOVE BIOGEL PI IND STRL 6.5 (GLOVE) ×1 IMPLANT
GLOVE BIOGEL PI IND STRL 7.0 (GLOVE) ×1 IMPLANT
GLOVE BIOGEL PI IND STRL 7.5 (GLOVE) ×1 IMPLANT
GLOVE BIOGEL PI IND STRL 8 (GLOVE) ×1 IMPLANT
GLOVE BIOGEL PI INDICATOR 6.5 (GLOVE) ×2
GLOVE BIOGEL PI INDICATOR 7.0 (GLOVE) ×2
GLOVE BIOGEL PI INDICATOR 7.5 (GLOVE) ×2
GLOVE BIOGEL PI INDICATOR 8 (GLOVE) ×2
GLOVE SURG SS PI 7.0 STRL IVOR (GLOVE) ×2 IMPLANT
GOWN STRL REUS W/ TWL LRG LVL3 (GOWN DISPOSABLE) ×2 IMPLANT
GOWN STRL REUS W/ TWL XL LVL3 (GOWN DISPOSABLE) ×1 IMPLANT
GOWN STRL REUS W/TWL LRG LVL3 (GOWN DISPOSABLE) ×6
GOWN STRL REUS W/TWL XL LVL3 (GOWN DISPOSABLE) ×3
HANDPIECE INTERPULSE COAX TIP (DISPOSABLE)
K-WIRE 1.6X150 (WIRE) ×6
KIT BASIN OR (CUSTOM PROCEDURE TRAY) ×3 IMPLANT
KIT TURNOVER KIT B (KITS) ×3 IMPLANT
KWIRE 1.6X150 (WIRE) IMPLANT
MANIFOLD NEPTUNE II (INSTRUMENTS) ×3 IMPLANT
NDL HYPO 25X1 1.5 SAFETY (NEEDLE) ×1 IMPLANT
NEEDLE 22X1 1/2 (OR ONLY) (NEEDLE) IMPLANT
NEEDLE HYPO 25X1 1.5 SAFETY (NEEDLE) ×3 IMPLANT
NS IRRIG 1000ML POUR BTL (IV SOLUTION) ×3 IMPLANT
PACK ORTHO EXTREMITY (CUSTOM PROCEDURE TRAY) ×3 IMPLANT
PAD ARMBOARD 7.5X6 YLW CONV (MISCELLANEOUS) ×6 IMPLANT
PAD CAST 4YDX4 CTTN HI CHSV (CAST SUPPLIES) ×1 IMPLANT
PADDING CAST COTTON 4X4 STRL (CAST SUPPLIES) ×3
PADDING CAST COTTON 6X4 STRL (CAST SUPPLIES) ×3 IMPLANT
PLATE HUMERAL 4H LT 2.7 MEDL (Plate) ×2 IMPLANT
PLATE LOCK VA-LCP F2.7X127 7H (Plate) ×2 IMPLANT
PLATE LOCK VA-LCP F2.7X142 6H (Plate) ×2 IMPLANT
SCREW CORTEX 3.5 18MM (Screw) ×2 IMPLANT
SCREW CORTEX 3.5 20MM (Screw) ×6 IMPLANT
SCREW CORTEX 3.5 22MM (Screw) ×2 IMPLANT
SCREW CORTEX 3.5 24MM (Screw) ×4 IMPLANT
SCREW CORTEX 3.5 26MM (Screw) ×4 IMPLANT
SCREW CORTEX 3.5 28MM (Screw) ×2 IMPLANT
SCREW LOCK CORT ST 3.5X18 (Screw) IMPLANT
SCREW LOCK CORT ST 3.5X20 (Screw) IMPLANT
SCREW LOCK CORT ST 3.5X22 (Screw) IMPLANT
SCREW LOCK CORT ST 3.5X24 (Screw) IMPLANT
SCREW LOCK CORT ST 3.5X26 (Screw) IMPLANT
SCREW LOCK CORT ST 3.5X28 (Screw) IMPLANT
SCREW LOCK T8 22X2.7XST VA (Screw) IMPLANT
SCREW LOCK T8 24X2.7XSTVA (Screw) IMPLANT
SCREW LOCK VA ST 2.7X18 (Screw) ×2 IMPLANT
SCREW LOCK VA ST 2.7X20 (Screw) ×4 IMPLANT
SCREW LOCKING 2.7X22MM (Screw) ×6 IMPLANT
SCREW LOCKING 2.7X24MM (Screw) ×6 IMPLANT
SCREW LOCKING 2.7X28 (Screw) ×2 IMPLANT
SCREW LOCKING 2.7X56MM (Screw) ×4 IMPLANT
SCREW LOCKING VA 2.7X30MM (Screw) ×6 IMPLANT
SCREW LOCKING VA 2.7X46 (Screw) ×2 IMPLANT
SCREW METAPHYSCAL 34MM (Screw) ×2 IMPLANT
SCREW METAPHYSEAL 2.7X58MM (Screw) ×2 IMPLANT
SCREW SLF TPNG 2.7X26MM META (Screw) ×2 IMPLANT
SCREW THRD STARDRIVE ST 2.7X44 (Screw) ×2 IMPLANT
SET HNDPC FAN SPRY TIP SCT (DISPOSABLE) IMPLANT
SPONGE LAP 18X18 RF (DISPOSABLE) ×3 IMPLANT
STAPLER VISISTAT 35W (STAPLE) ×3 IMPLANT
STOCKINETTE IMPERVIOUS 9X36 MD (GAUZE/BANDAGES/DRESSINGS) IMPLANT
SUCTION FRAZIER HANDLE 10FR (MISCELLANEOUS) ×2
SUCTION TUBE FRAZIER 10FR DISP (MISCELLANEOUS) ×1 IMPLANT
SUT ETHILON 2 0 FS 18 (SUTURE) ×6 IMPLANT
SUT ETHILON 3 0 PS 1 (SUTURE) ×6 IMPLANT
SUT MON AB 2-0 CT1 36 (SUTURE) ×3 IMPLANT
SUT PDS AB 0 CT 36 (SUTURE) IMPLANT
SUT VIC AB 0 CT1 27 (SUTURE) ×6
SUT VIC AB 0 CT1 27XBRD ANBCTR (SUTURE) ×2 IMPLANT
SUT VIC AB 2-0 CT1 27 (SUTURE) ×6
SUT VIC AB 2-0 CT1 TAPERPNT 27 (SUTURE) ×2 IMPLANT
SWAB CULTURE ESWAB REG 1ML (MISCELLANEOUS) IMPLANT
SYR 5ML LL (SYRINGE) IMPLANT
SYR CONTROL 10ML LL (SYRINGE) ×3 IMPLANT
TOWEL OR 17X24 6PK STRL BLUE (TOWEL DISPOSABLE) ×3 IMPLANT
TOWEL OR 17X26 10 PK STRL BLUE (TOWEL DISPOSABLE) ×9 IMPLANT
TRAY FOLEY MTR SLVR 16FR STAT (SET/KITS/TRAYS/PACK) IMPLANT
TUBE CONNECTING 12'X1/4 (SUCTIONS) ×2
TUBE CONNECTING 12X1/4 (SUCTIONS) ×3 IMPLANT
UNDERPAD 30X30 (UNDERPADS AND DIAPERS) ×3 IMPLANT
WATER STERILE IRR 1000ML POUR (IV SOLUTION) ×3 IMPLANT
YANKAUER SUCT BULB TIP NO VENT (SUCTIONS) ×3 IMPLANT
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
## 2018-05-18 NOTE — Progress Notes (Signed)
PT Cancellation Note  Patient Details Name: Carrie King MRN: [id number] DOB: [birth year]   Cancelled Treatment:    Reason Eval/Treat Not Completed: Patient not medically ready. Per ORTHO MD note; patient currently NWBing LUE and LLE (change from previous sessions) will await updated Xrays and confirmation of WBing status tomorrow am before proceeding with re-assessment as mobility LLE restrictions may impact POC and DME/Discharge needs.    Fabio Asa 05/18/2018, 2:58 PM Charlotte Crumb, PT DPT  Board Certified Neurologic Specialist Acute Rehabilitation Services Pager [phone] Office [phone]

## 2018-05-18 NOTE — Progress Notes (Signed)
Ortho Trauma  Doing okay this AM. No questions about surgery. Risks and benefits discussed again and LUE marked. Newly diagnosed L medial malleolus fracture. Plan for nonoperative treatment for now. Recommend new x-rays tomorrow.  Roby Lofts, MD [facility] [phone] (phone) [phone] (office) [email or web address]

## 2018-05-18 NOTE — Transfer of Care (Signed)
Immediate Anesthesia Transfer of Care Note  Patient: LOETTA ALBAREZ  Procedure(s) Performed: IRRIGATION AND DEBRIDEMENT EXTREMITY (Left Arm Upper) OPEN REDUCTION INTERNAL FIXATION (ORIF) DISTAL HUMERUS FRACTURE (Left Arm Upper) OPEN REDUCTION INTERNAL FIXATION (ORIF) ULNAR FRACTURE (Left Arm Upper)  Patient Location: PACU  Anesthesia Type:GA combined with regional for post-op pain  Level of Consciousness: awake and drowsy  Airway & Oxygen Therapy: Patient Spontanous Breathing and Patient connected to nasal cannula oxygen  Post-op Assessment: Report given to RN and Post -op Vital signs reviewed and stable  Post vital signs: Reviewed and stable  Last Vitals:  Vitals Value Taken Time  BP 131/64 05/18/2018 12:25 PM  Temp    Pulse 92 05/18/2018 12:27 PM  Resp 19 05/18/2018 12:27 PM  SpO2 96 % 05/18/2018 12:27 PM  Vitals shown include unvalidated device data.  Last Pain:  Vitals:   05/18/18 0411  TempSrc: Oral  PainSc:       Patients Stated Pain Goal: 0 (05/17/18 2223)  Complications: No apparent anesthesia complications

## 2018-05-18 NOTE — Anesthesia Procedure Notes (Signed)
Anesthesia Regional Block: Supraclavicular block   Pre-Anesthetic Checklist: ,, timeout performed, Correct Patient, Correct Site, Correct Laterality, Correct Procedure, Correct Position, site marked, Risks and benefits discussed,  Surgical consent,  Pre-op evaluation,  At surgeon's request and post-op pain management  Laterality: Left  Prep: chloraprep       Needles:  Injection technique: Single-shot  Needle Type: Echogenic Stimulator Needle     Needle Length: 9cm  Needle Gauge: 21     Additional Needles:   Procedures:,,,, ultrasound used (permanent image in chart),,,,  Narrative:  Start time: 05/18/2018 7:00 AM End time: 05/18/2018 7:06 AM Injection made incrementally with aspirations every 5 mL.  Performed by: Personally  Anesthesiologist: Lucretia Kern, MD  Additional Notes: Monitors applied. Injection made in 5cc increments. No resistance to injection. Good needle visualization. Patient tolerated procedure well.

## 2018-05-18 NOTE — Anesthesia Postprocedure Evaluation (Signed)
Anesthesia Post Note  Patient: Carrie King  Procedure(s) Performed: IRRIGATION AND DEBRIDEMENT EXTREMITY (Left Arm Upper) OPEN REDUCTION INTERNAL FIXATION (ORIF) DISTAL HUMERUS FRACTURE (Left Arm Upper) OPEN REDUCTION INTERNAL FIXATION (ORIF) ULNAR FRACTURE (Left Arm Upper)     Patient location during evaluation: PACU Anesthesia Type: General Level of consciousness: awake and alert Pain management: pain level controlled Vital Signs Assessment: post-procedure vital signs reviewed and stable Respiratory status: spontaneous breathing, nonlabored ventilation, respiratory function stable and patient connected to nasal cannula oxygen Cardiovascular status: blood pressure returned to baseline and stable Postop Assessment: no apparent nausea or vomiting Anesthetic complications: no    Last Vitals:  Vitals:   05/18/18 1300 05/18/18 1310  BP:  114/61  Pulse: 87 86  Resp: 16 18  Temp:    SpO2: 95% 96%    Last Pain:  Vitals:   05/18/18 1310  TempSrc:   PainSc: 0-No pain                 Lucretia Kern

## 2018-05-18 NOTE — Progress Notes (Signed)
Inpatient Diabetes Program Recommendations  AACE/ADA: New Consensus Statement on Inpatient Glycemic Control (2015)  Target Ranges:  Prepandial:   less than 140 mg/dL      Peak postprandial:   less than 180 mg/dL (1-2 hours)      Critically ill patients:  140 - 180 mg/dL    Results for CHRISTABELL, PEZZUTI (MRN [id number]) as of 05/18/2018 13:40  Ref. Range 05/17/2018 07:25 05/17/2018 11:14 05/17/2018 16:11 05/17/2018 21:17  Glucose-Capillary Latest Ref Range: 70 - 99 mg/dL 948 (H)  4 units NOVOLOG +  14 units LANTUS  192 (H)  4 units NOVOLOG  147 (H)  3 units NOVOLOG  162 (H)   Results for JASMINEMARIE, BOGUS (MRN [id number]) as of 05/18/2018 13:40  Ref. Range 05/18/2018 12:28  Glucose-Capillary Latest Ref Range: 70 - 99 mg/dL 093 (H)  Given 5 mg Decadron X 1 dose at 7:55am today   Results for TYLAH, GULLATT (MRN [id number]) as of 05/18/2018 13:40  Ref. Range 05/14/2018 18:38  Hemoglobin A1C Latest Ref Range: 4.8 - 5.6 % 9.9 (H)  (237 mg/dl)    History: Type 2 DM  Home DM Meds: None  Current Orders: Lantus 14 units Daily      Novolog Resistant Correction Scale/ SSI (0-20 units) TID AC + HS    MD- Note that patient was not taking any DM medications prior to admission.  Currently getting Lantus 14 units Daily + Novolog SSI.  Likely needs at least oral DM medication at home to help control CBGs.  Pt states intolerance to Metformin from past experience.  Please consider dual oral therapy at time of discharge.  Patient can purchase both Amaryl and Actos for $4 each at [facility] with a Rx.  Recommend the following at time of d/c home:  1. Amaryl 4 mg Daily  2. Actos 15 mg Daily    DM Coordinator spoke with pt by phone on 04/17 regarding her elevated A1c of 9.9%.  Pt told DM Coordinator she took Metformin in the past but had to stop the Metformin due to nausea and vomiting.  Does not have PCP--Note Care Management has been consulted to help pt get established with low cost  PCP.  Pt was also counseled by the DM Coordinator about appropriate diet and also given info on purchasing an inexpensive CBG meter OTC at [facility] after d/c.      --Will follow patient during hospitalization--  Ambrose Finland RN, MSN, CDE Diabetes Coordinator Inpatient Glycemic Control Team Team Pager: [phone] (8a-5p)
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
## 2018-05-18 NOTE — Anesthesia Procedure Notes (Signed)
Procedure Name: Intubation Date/Time: 05/18/2018 7:42 AM Performed by: Maimouna Rondeau T, CRNA Pre-anesthesia Checklist: Patient identified, Emergency Drugs available, Suction available and Patient being monitored Patient Re-evaluated:Patient Re-evaluated prior to induction Oxygen Delivery Method: Circle system utilized Preoxygenation: Pre-oxygenation with 100% oxygen Induction Type: IV induction and Rapid sequence Laryngoscope Size: Miller and 2 Grade View: Grade I Tube type: Oral Tube size: 7.5 mm Number of attempts: 1 Airway Equipment and Method: Patient positioned with wedge pillow and Stylet Placement Confirmation: ETT inserted through vocal cords under direct vision,  positive ETCO2 and breath sounds checked- equal and bilateral Secured at: 21 cm Tube secured with: Tape Dental Injury: Teeth and Oropharynx as per pre-operative assessment

## 2018-05-19 ENCOUNTER — Encounter (HOSPITAL_COMMUNITY): Payer: Self-pay | Admitting: Student

## 2018-05-19 ENCOUNTER — Inpatient Hospital Stay (HOSPITAL_COMMUNITY): Payer: Medicaid Other

## 2018-05-19 LAB — CBC
HCT: 24.5 % — ABNORMAL LOW (ref 36.0–46.0)
Hemoglobin: 8.4 g/dL — ABNORMAL LOW (ref 12.0–15.0)
MCH: 32.1 pg (ref 26.0–34.0)
MCHC: 34.3 g/dL (ref 30.0–36.0)
MCV: 93.5 fL (ref 80.0–100.0)
Platelets: 266 10*3/uL (ref 150–400)
RBC: 2.62 MIL/uL — ABNORMAL LOW (ref 3.87–5.11)
RDW: 13.1 % (ref 11.5–15.5)
WBC: 8.6 10*3/uL (ref 4.0–10.5)
nRBC: 0 % (ref 0.0–0.2)

## 2018-05-19 LAB — GLUCOSE, CAPILLARY
Glucose-Capillary: 200 mg/dL — ABNORMAL HIGH (ref 70–99)
Glucose-Capillary: 188 mg/dL — ABNORMAL HIGH (ref 70–99)
Glucose-Capillary: 287 mg/dL — ABNORMAL HIGH (ref 70–99)
Glucose-Capillary: 165 mg/dL — ABNORMAL HIGH (ref 70–99)
Glucose-Capillary: 168 mg/dL — ABNORMAL HIGH (ref 70–99)
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
## 2018-05-19 MED ORDER — OXYCODONE HCL 5 MG PO TABS
10.0000 mg | ORAL_TABLET | ORAL | Status: DC | PRN
  Administered 2018-05-19 – 2018-05-20 (×3): 15 mg via ORAL
  Filled 2018-05-19 (×3): qty 3

## 2018-05-19 MED ORDER — INSULIN GLARGINE 100 UNIT/ML ~~LOC~~ SOLN
18.0000 [IU] | Freq: Every day | SUBCUTANEOUS | Status: DC
  Administered 2018-05-19 – 2018-05-20 (×2): 18 [IU] via SUBCUTANEOUS
  Filled 2018-05-19 (×3): qty 0.18

## 2018-05-19 MED ORDER — KETOROLAC TROMETHAMINE 15 MG/ML IJ SOLN
15.0000 mg | Freq: Four times a day (QID) | INTRAMUSCULAR | Status: AC
  Administered 2018-05-19 – 2018-05-20 (×4): 15 mg via INTRAVENOUS
  Filled 2018-05-19 (×4): qty 1

## 2018-05-19 MED ORDER — TRAMADOL HCL 50 MG PO TABS
50.0000 mg | ORAL_TABLET | Freq: Four times a day (QID) | ORAL | Status: DC
  Administered 2018-05-19 – 2018-05-20 (×5): 50 mg via ORAL
  Filled 2018-05-19 (×5): qty 1

## 2018-05-19 NOTE — Progress Notes (Signed)
Orthopedic Tech Progress Note Patient Details:  Carrie King 11-23-1955 [id number]  Ortho Devices Type of Ortho Device: CAM walker, Arm sling Ortho Device/Splint Location: ULE Ortho Device/Splint Interventions: Application   Post Interventions Patient Tolerated: Well Instructions Provided: Care of device   Saul Fordyce 05/19/2018, 10:55 AM
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
## 2018-05-20 DIAGNOSIS — S46312A Strain of muscle, fascia and tendon of triceps, left arm, initial encounter: Secondary | ICD-10-CM | POA: Insufficient documentation

## 2018-05-20 DIAGNOSIS — S8253XA Displaced fracture of medial malleolus of unspecified tibia, initial encounter for closed fracture: Secondary | ICD-10-CM

## 2018-05-20 LAB — BASIC METABOLIC PANEL
Anion gap: 9 (ref 5–15)
BUN: 13 mg/dL (ref 8–23)
CO2: 27 mmol/L (ref 22–32)
Calcium: 8.9 mg/dL (ref 8.9–10.3)
Chloride: 101 mmol/L (ref 98–111)
Creatinine, Ser: 0.76 mg/dL (ref 0.44–1.00)
GFR calc Af Amer: >60 mL/min (ref >60)
GFR calc non Af Amer: >60 mL/min (ref >60)
Glucose, Bld: 137 mg/dL — ABNORMAL HIGH (ref 70–99)
Potassium: 3.1 mmol/L — ABNORMAL LOW (ref 3.5–5.1)
Sodium: 137 mmol/L (ref 135–145)

## 2018-05-20 LAB — GLUCOSE, CAPILLARY
Glucose-Capillary: 196 mg/dL — ABNORMAL HIGH (ref 70–99)
Glucose-Capillary: 207 mg/dL — ABNORMAL HIGH (ref 70–99)

## 2018-05-20 MED ORDER — GABAPENTIN 100 MG PO CAPS: 100.0000 mg | ORAL_CAPSULE | Freq: Three times a day (TID) | ORAL | 0 refills | Status: DC

## 2018-05-20 MED ORDER — METOPROLOL TARTRATE 25 MG PO TABS: 12.5000 mg | ORAL_TABLET | Freq: Two times a day (BID) | ORAL | 1 refills | Status: DC

## 2018-05-20 MED ORDER — LISINOPRIL 10 MG PO TABS: 10.0000 mg | ORAL_TABLET | Freq: Every day | ORAL | 1 refills | Status: DC

## 2018-05-20 MED ORDER — OXYCODONE HCL 10 MG PO TABS: 10.0000 mg | ORAL_TABLET | Freq: Four times a day (QID) | ORAL | 0 refills | Status: DC | PRN

## 2018-05-20 MED ORDER — METHOCARBAMOL 500 MG PO TABS: 500.0000 mg | ORAL_TABLET | Freq: Four times a day (QID) | ORAL | 0 refills | Status: DC | PRN

## 2018-05-20 MED ORDER — TRAMADOL HCL 50 MG PO TABS: 50.0000 mg | ORAL_TABLET | Freq: Four times a day (QID) | ORAL | 0 refills | Status: DC | PRN

## 2018-05-20 MED ORDER — ACETAMINOPHEN 325 MG PO TABS: 650.0000 mg | ORAL_TABLET | Freq: Four times a day (QID) | ORAL | 0 refills | Status: DC | PRN

## 2018-05-20 MED ORDER — DOCUSATE SODIUM 100 MG PO CAPS: 100.0000 mg | ORAL_CAPSULE | Freq: Two times a day (BID) | ORAL | 0 refills | Status: DC | PRN

## 2018-05-20 MED ORDER — GLIMEPIRIDE 4 MG PO TABS: 4.0000 mg | ORAL_TABLET | ORAL | 1 refills | Status: DC

## 2018-05-20 MED ORDER — PIOGLITAZONE HCL 15 MG PO TABS: 15.0000 mg | ORAL_TABLET | Freq: Every day | ORAL | 1 refills | Status: DC

## 2018-05-20 MED FILL — PIOGLITAZONE HCL 15 MG TAB: 15 | 30 days supply | Qty: 30 | Fill #0

## 2018-05-20 MED FILL — GLIMEPIRIDE 2 MG TABLET: 2 | 30 days supply | Qty: 60 | Fill #0

## 2018-05-20 MED FILL — METHOCARBAMOL 500 MG TABLET: 500 | 10 days supply | Qty: 30 | Fill #0

## 2018-05-20 MED FILL — METOPROLOL TARTRATE 25 MG T: 25 | 30 days supply | Qty: 30 | Fill #0

## 2018-05-20 MED FILL — oxyCODONE HCL 10 MG TABS: 10 | 4 days supply | Qty: 15 | Fill #0

## 2018-05-20 MED FILL — GABAPENTIN 100 MG CAPSULE: 100 | 30 days supply | Qty: 90 | Fill #0

## 2018-05-20 MED FILL — LISINOPRIL 10 MG TABS: 10 | 30 days supply | Qty: 30 | Fill #0
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
## 2018-05-20 NOTE — Discharge Instructions (Signed)
Please remain in your sling and non-weight bearing to your left upper extremity.  Please wear cam walker to your left lower extremity. You can bear weight as tolerated.   It is important to call and schedule follow up appointments with the orthopedist and establish care with a primary care physician in regards to your diabetes, hypertension and rib fractures.    Rib Fracture  A rib fracture is a break or crack in one of the bones of the ribs. The ribs are long, curved bones that wrap around your chest and attach to your spine and your breastbone. The ribs protect your heart, lungs, and other organs in the chest. A broken or cracked rib is often painful but is not usually serious. Most rib fractures heal on their own over time. However, rib fractures can be more serious if multiple ribs are broken or if broken ribs move out of place and push against other structures or organs. What are the causes? This condition is caused by:  Repetitive movements with high force, such as pitching a baseball or having severe coughing spells.  A direct blow to the chest, such as a sports injury, a car accident, or a fall.  Cancer that has spread to the bones, which can weaken bones and cause them to break. What are the signs or symptoms? Symptoms of this condition include:  Pain when you breathe in or cough.  Pain when someone presses on the injured area.  Feeling short of breath. How is this diagnosed? This condition is diagnosed with a physical exam and medical history. Imaging tests may also be done, such as:  Chest X-ray.  CT scan.  MRI.  Bone scan.  Chest ultrasound. How is this treated? Treatment for this condition depends on the severity of the fracture. Most rib fractures usually heal on their own in 1-3 months. Sometimes healing takes longer if there is a cough that does not stop or if there are other activities that make the injury worse (aggravating factors). While you heal, you will  be given medicines to control the pain. You will also be taught deep breathing exercises. Severe injuries may require hospitalization or surgery. Follow these instructions at home: Managing pain, stiffness, and swelling  If directed, apply ice to the injured area. ? Put ice in a plastic bag. ? Place a towel between your skin and the bag. ? Leave the ice on for 20 minutes, 2-3 times a day.  Take over-the-counter and prescription medicines only as told by your health care provider. Activity  Avoid a lot of activity and any activities or movements that cause pain. Be careful during activities and avoid bumping the injured rib.  Slowly increase your activity as told by your health care provider. General instructions  Do deep breathing exercises as told by your health care provider. This helps prevent pneumonia, which is a common complication of a broken rib. Your health care provider may instruct you to: ? Take deep breaths several times a day. ? Try to cough several times a day, holding a pillow against the injured area. ? Use a device called incentive spirometer to practice deep breathing several times a day. - every hour x3  Drink enough fluid to keep your urine pale yellow.  Do not wear a rib belt or binder. These restrict breathing, which can lead to pneumonia.  Keep all follow-up visits as told by your health care provider. This is important. Contact a health care provider if:  You have  a fever. Get help right away if:  You have difficulty breathing or you are short of breath.  You develop a cough that does not stop, or you cough up thick or bloody sputum.  You have nausea, vomiting, or pain in your abdomen.  Your pain gets worse and medicine does not help. Summary  A rib fracture is a break or crack in one of the bones of the ribs.  A broken or cracked rib is often painful but is not usually serious.  Most rib fractures heal on their own over time.  Treatment for  this condition depends on the severity of the fracture.  Avoid a lot of activity and any activities or movements that cause pain. This information is not intended to replace advice given to you by your health care provider. Make sure you discuss any questions you have with your health care provider. Document Released: 01/14/2005 Document Revised: 04/15/2016 Document Reviewed: 04/15/2016 Elsevier Interactive Patient Education  2019 [employer].   Hypertension Hypertension, commonly called high blood pressure, is when the force of blood pumping through the arteries is too strong. The arteries are the blood vessels that carry blood from the heart throughout the body. Hypertension forces the heart to work harder to pump blood and may cause arteries to become narrow or stiff. Having untreated or uncontrolled hypertension can cause heart attacks, strokes, kidney disease, and other problems. A blood pressure reading consists of a higher number over a lower number. Ideally, your blood pressure should be below 120/80. The first ("top") number is called the systolic pressure. It is a measure of the pressure in your arteries as your heart beats. The second ("bottom") number is called the diastolic pressure. It is a measure of the pressure in your arteries as the heart relaxes. What are the causes? The cause of this condition is not known. What increases the risk? Some risk factors for high blood pressure are under your control. Others are not. Factors you can change  Smoking.  Having type 2 diabetes mellitus, high cholesterol, or both.  Not getting enough exercise or physical activity.  Being overweight.  Having too much fat, sugar, calories, or salt (sodium) in your diet.  Drinking too much alcohol. Factors that are difficult or impossible to change  Having chronic kidney disease.  Having a family history of high blood pressure.  Age. Risk increases with age.  Race. You may be at higher  risk if you are African-American.  Gender. Men are at higher risk than women before age 64. After age 56, women are at higher risk than men.  Having obstructive sleep apnea.  Stress. What are the signs or symptoms? Extremely high blood pressure (hypertensive crisis) may cause:  Headache.  Anxiety.  Shortness of breath.  Nosebleed.  Nausea and vomiting.  Severe chest pain.  Jerky movements you cannot control (seizures). How is this diagnosed? This condition is diagnosed by measuring your blood pressure while you are seated, with your arm resting on a surface. The cuff of the blood pressure monitor will be placed directly against the skin of your upper arm at the level of your heart. It should be measured at least twice using the same arm. Certain conditions can cause a difference in blood pressure between your right and left arms. Certain factors can cause blood pressure readings to be lower or higher than normal (elevated) for a short period of time:  When your blood pressure is higher when you are in a health  care provider's office than when you are at home, this is called white coat hypertension. Most people with this condition do not need medicines.  When your blood pressure is higher at home than when you are in a health care provider's office, this is called masked hypertension. Most people with this condition may need medicines to control blood pressure. If you have a high blood pressure reading during one visit or you have normal blood pressure with other risk factors:  You may be asked to return on a different day to have your blood pressure checked again.  You may be asked to monitor your blood pressure at home for 1 week or longer. If you are diagnosed with hypertension, you may have other blood or imaging tests to help your health care provider understand your overall risk for other conditions. How is this treated? This condition is treated by making healthy lifestyle  changes, such as eating healthy foods, exercising more, and reducing your alcohol intake. Your health care provider may prescribe medicine if lifestyle changes are not enough to get your blood pressure under control, and if:  Your systolic blood pressure is above 130.  Your diastolic blood pressure is above 80. Your personal target blood pressure may vary depending on your medical conditions, your age, and other factors. Follow these instructions at home: Eating and drinking   Eat a diet that is high in fiber and potassium, and low in sodium, added sugar, and fat. An example eating plan is called the DASH (Dietary Approaches to Stop Hypertension) diet. To eat this way: ? Eat plenty of fresh fruits and vegetables. Try to fill half of your plate at each meal with fruits and vegetables. ? Eat whole grains, such as whole wheat pasta, brown rice, or whole grain bread. Fill about one quarter of your plate with whole grains. ? Eat or drink low-fat dairy products, such as skim milk or low-fat yogurt. ? Avoid fatty cuts of meat, processed or cured meats, and poultry with skin. Fill about one quarter of your plate with lean proteins, such as fish, chicken without skin, beans, eggs, and tofu. ? Avoid premade and processed foods. These tend to be higher in sodium, added sugar, and fat.  Reduce your daily sodium intake. Most people with hypertension should eat less than 1,500 mg of sodium a day.  Limit alcohol intake to no more than 1 drink a day for nonpregnant women and 2 drinks a day for men. One drink equals 12 oz of beer, 5 oz of wine, or 1 oz of hard liquor. Lifestyle   Work with your health care provider to maintain a healthy body weight or to lose weight. Ask what an ideal weight is for you.  Get at least 30 minutes of exercise that causes your heart to beat faster (aerobic exercise) most days of the week. Activities may include walking, swimming, or biking.  Include exercise to strengthen  your muscles (resistance exercise), such as pilates or lifting weights, as part of your weekly exercise routine. Try to do these types of exercises for 30 minutes at least 3 days a week.  Do not use any products that contain nicotine or tobacco, such as cigarettes and e-cigarettes. If you need help quitting, ask your health care provider.  Monitor your blood pressure at home as told by your health care provider.  Keep all follow-up visits as told by your health care provider. This is important. Medicines  Take over-the-counter and prescription medicines only as  told by your health care provider. Follow directions carefully. Blood pressure medicines must be taken as prescribed.  Do not skip doses of blood pressure medicine. Doing this puts you at risk for problems and can make the medicine less effective.  Ask your health care provider about side effects or reactions to medicines that you should watch for. Contact a health care provider if:  You think you are having a reaction to a medicine you are taking.  You have headaches that keep coming back (recurring).  You feel dizzy.  You have swelling in your ankles.  You have trouble with your vision. Get help right away if:  You develop a severe headache or confusion.  You have unusual weakness or numbness.  You feel faint.  You have severe pain in your chest or abdomen.  You vomit repeatedly.  You have trouble breathing. Summary  Hypertension is when the force of blood pumping through your arteries is too strong. If this condition is not controlled, it may put you at risk for serious complications.  Your personal target blood pressure may vary depending on your medical conditions, your age, and other factors. For most people, a normal blood pressure is less than 120/80.  Hypertension is treated with lifestyle changes, medicines, or a combination of both. Lifestyle changes include weight loss, eating a healthy, low-sodium diet,  exercising more, and limiting alcohol. This information is not intended to replace advice given to you by your health care provider. Make sure you discuss any questions you have with your health care provider. Document Released: 01/14/2005 Document Revised: 12/13/2015 Document Reviewed: 12/13/2015 Elsevier Interactive Patient Education  2019 [employer]  Diabetes Mellitus and Nutrition, Adult When you have diabetes (diabetes mellitus), it is very important to have healthy eating habits because your blood sugar (glucose) levels are greatly affected by what you eat and drink. Eating healthy foods in the appropriate amounts, at about the same times every day, can help you:  Control your blood glucose.  Lower your risk of heart disease.  Improve your blood pressure.  Reach or maintain a healthy weight. Every person with diabetes is different, and each person has different needs for a meal plan. Your health care provider may recommend that you work with a diet and nutrition specialist (dietitian) to make a meal plan that is best for you. Your meal plan may vary depending on factors such as:  The calories you need.  The medicines you take.  Your weight.  Your blood glucose, blood pressure, and cholesterol levels.  Your activity level.  Other health conditions you have, such as heart or kidney disease. How do carbohydrates affect me? Carbohydrates, also called carbs, affect your blood glucose level more than any other type of food. Eating carbs naturally raises the amount of glucose in your blood. Carb counting is a method for keeping track of how many carbs you eat. Counting carbs is important to keep your blood glucose at a healthy level, especially if you use insulin or take certain oral diabetes medicines. It is important to know how many carbs you can safely have in each meal. This is different for every person. Your dietitian can help you calculate how many carbs you should have at  each meal and for each snack. Foods that contain carbs include:  Bread, cereal, rice, pasta, and crackers.  Potatoes and corn.  Peas, beans, and lentils.  Milk and yogurt.  Fruit and juice.  Desserts, such as cakes, cookies, ice cream, and  candy. How does alcohol affect me? Alcohol can cause a sudden decrease in blood glucose (hypoglycemia), especially if you use insulin or take certain oral diabetes medicines. Hypoglycemia can be a life-threatening condition. Symptoms of hypoglycemia (sleepiness, dizziness, and confusion) are similar to symptoms of having too much alcohol. If your health care provider says that alcohol is safe for you, follow these guidelines:  Limit alcohol intake to no more than 1 drink per day for nonpregnant women and 2 drinks per day for men. One drink equals 12 oz of beer, 5 oz of wine, or 1 oz of hard liquor.  Do not drink on an empty stomach.  Keep yourself hydrated with water, diet soda, or unsweetened iced tea.  Keep in mind that regular soda, juice, and other mixers may contain a lot of sugar and must be counted as carbs. What are tips for following this plan?  Reading food labels  Start by checking the serving size on the "Nutrition Facts" label of packaged foods and drinks. The amount of calories, carbs, fats, and other nutrients listed on the label is based on one serving of the item. Many items contain more than one serving per package.  Check the total grams (g) of carbs in one serving. You can calculate the number of servings of carbs in one serving by dividing the total carbs by 15. For example, if a food has 30 g of total carbs, it would be equal to 2 servings of carbs.  Check the number of grams (g) of saturated and trans fats in one serving. Choose foods that have low or no amount of these fats.  Check the number of milligrams (mg) of salt (sodium) in one serving. Most people should limit total sodium intake to less than 2,300 mg per  day.  Always check the nutrition information of foods labeled as "low-fat" or "nonfat". These foods may be higher in added sugar or refined carbs and should be avoided.  Talk to your dietitian to identify your daily goals for nutrients listed on the label. Shopping  Avoid buying canned, premade, or processed foods. These foods tend to be high in fat, sodium, and added sugar.  Shop around the outside edge of the grocery store. This includes fresh fruits and vegetables, bulk grains, fresh meats, and fresh dairy. Cooking  Use low-heat cooking methods, such as baking, instead of high-heat cooking methods like deep frying.  Cook using healthy oils, such as olive, canola, or sunflower oil.  Avoid cooking with butter, cream, or high-fat meats. Meal planning  Eat meals and snacks regularly, preferably at the same times every day. Avoid going long periods of time without eating.  Eat foods high in fiber, such as fresh fruits, vegetables, beans, and whole grains. Talk to your dietitian about how many servings of carbs you can eat at each meal.  Eat 4-6 ounces (oz) of lean protein each day, such as lean meat, chicken, fish, eggs, or tofu. One oz of lean protein is equal to: ? 1 oz of meat, chicken, or fish. ? 1 egg. ?  cup of tofu.  Eat some foods each day that contain healthy fats, such as avocado, nuts, seeds, and fish. Lifestyle  Check your blood glucose regularly.  Exercise regularly as told by your health care provider. This may include: ? 150 minutes of moderate-intensity or vigorous-intensity exercise each week. This could be brisk walking, biking, or water aerobics. ? Stretching and doing strength exercises, such as yoga or weightlifting, at least  2 times a week.  Take medicines as told by your health care provider.  Do not use any products that contain nicotine or tobacco, such as cigarettes and e-cigarettes. If you need help quitting, ask your health care provider.  Work with  a Veterinary surgeon or diabetes educator to identify strategies to manage stress and any emotional and social challenges. Questions to ask a health care provider  Do I need to meet with a diabetes educator?  Do I need to meet with a dietitian?  What number can I call if I have questions?  When are the best times to check my blood glucose? Where to find more information:  American Diabetes Association: diabetes.org  Academy of Nutrition and Dietetics: www.eatright.[employer] of Diabetes and Digestive and Kidney Diseases (NIH): CarF[employer].tn Summary  A healthy meal plan will help you control your blood glucose and maintain a healthy lifestyle.  Working with a diet and nutrition specialist (dietitian) can help you make a meal plan that is best for you.  Keep in mind that carbohydrates (carbs) and alcohol have immediate effects on your blood glucose levels. It is important to count carbs and to use alcohol carefully. This information is not intended to replace advice given to you by your health care provider. Make sure you discuss any questions you have with your health care provider. Document Released: 10/11/2004 Document Revised: 08/14/2016 Document Reviewed: 02/19/2016 Elsevier Interactive Patient Education  2019 [employer].

## 2018-05-20 NOTE — Progress Notes (Signed)
Patient discharged home with daughter. Patient stated understanding of instructions given. Supplies given to patient for dressing change at home. RN informed patient the importance of follow-up appointment and CBG at home
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
## 2018-05-21 LAB — TYPE AND SCREEN
ABO/RH(D): A POS
Antibody Screen: NEGATIVE
Unit division: 0
Unit division: 0

## 2018-05-21 LAB — BPAM RBC
Blood Product Expiration Date: 202004282359
Blood Product Expiration Date: 202004282359
Unit Type and Rh: 6200
Unit Type and Rh: 6200

## 2018-05-30 ENCOUNTER — Encounter (HOSPITAL_COMMUNITY): Payer: Self-pay

## 2018-05-30 ENCOUNTER — Inpatient Hospital Stay (HOSPITAL_COMMUNITY)
Admission: EM | Admit: 2018-05-30 | Discharge: 2018-06-02 | DRG: 042 | Disposition: A | Discharge status: Rehabilitation Facility | Payer: Medicaid Other | Attending: Internal Medicine | Admitting: Internal Medicine

## 2018-05-30 ENCOUNTER — Other Ambulatory Visit: Payer: Self-pay

## 2018-05-30 ENCOUNTER — Emergency Department (HOSPITAL_COMMUNITY): Payer: Medicaid Other

## 2018-05-30 DIAGNOSIS — S42402D Unspecified fracture of lower end of left humerus, subsequent encounter for fracture with routine healing: Secondary | ICD-10-CM

## 2018-05-30 DIAGNOSIS — I672 Cerebral atherosclerosis: Secondary | ICD-10-CM | POA: Diagnosis present

## 2018-05-30 DIAGNOSIS — E119 Type 2 diabetes mellitus without complications: Secondary | ICD-10-CM | POA: Diagnosis present

## 2018-05-30 DIAGNOSIS — Z8249 Family history of ischemic heart disease and other diseases of the circulatory system: Secondary | ICD-10-CM

## 2018-05-30 DIAGNOSIS — S2242XD Multiple fractures of ribs, left side, subsequent encounter for fracture with routine healing: Secondary | ICD-10-CM

## 2018-05-30 DIAGNOSIS — I639 Cerebral infarction, unspecified: Secondary | ICD-10-CM | POA: Diagnosis present

## 2018-05-30 DIAGNOSIS — E785 Hyperlipidemia, unspecified: Secondary | ICD-10-CM | POA: Diagnosis present

## 2018-05-30 DIAGNOSIS — Z79899 Other long term (current) drug therapy: Secondary | ICD-10-CM

## 2018-05-30 DIAGNOSIS — Z823 Family history of stroke: Secondary | ICD-10-CM

## 2018-05-30 DIAGNOSIS — R002 Palpitations: Secondary | ICD-10-CM | POA: Diagnosis present

## 2018-05-30 DIAGNOSIS — H538 Other visual disturbances: Secondary | ICD-10-CM | POA: Diagnosis present

## 2018-05-30 DIAGNOSIS — H539 Unspecified visual disturbance: Secondary | ICD-10-CM

## 2018-05-30 DIAGNOSIS — R260 Ataxic gait: Secondary | ICD-10-CM | POA: Diagnosis present

## 2018-05-30 DIAGNOSIS — Z79891 Long term (current) use of opiate analgesic: Secondary | ICD-10-CM

## 2018-05-30 DIAGNOSIS — I6340 Cerebral infarction due to embolism of unspecified cerebral artery: Principal | ICD-10-CM | POA: Diagnosis present

## 2018-05-30 DIAGNOSIS — Z833 Family history of diabetes mellitus: Secondary | ICD-10-CM

## 2018-05-30 DIAGNOSIS — S82892D Other fracture of left lower leg, subsequent encounter for closed fracture with routine healing: Secondary | ICD-10-CM

## 2018-05-30 DIAGNOSIS — Z8673 Personal history of transient ischemic attack (TIA), and cerebral infarction without residual deficits: Secondary | ICD-10-CM

## 2018-05-30 DIAGNOSIS — Z1159 Encounter for screening for other viral diseases: Secondary | ICD-10-CM

## 2018-05-30 DIAGNOSIS — F1290 Cannabis use, unspecified, uncomplicated: Secondary | ICD-10-CM | POA: Diagnosis present

## 2018-05-30 DIAGNOSIS — F17210 Nicotine dependence, cigarettes, uncomplicated: Secondary | ICD-10-CM | POA: Diagnosis present

## 2018-05-30 DIAGNOSIS — I10 Essential (primary) hypertension: Secondary | ICD-10-CM | POA: Diagnosis present

## 2018-05-30 DIAGNOSIS — Z716 Tobacco abuse counseling: Secondary | ICD-10-CM

## 2018-05-30 DIAGNOSIS — R42 Dizziness and giddiness: Secondary | ICD-10-CM

## 2018-05-30 LAB — URINALYSIS, ROUTINE W REFLEX MICROSCOPIC
Bilirubin Urine: NEGATIVE
Glucose, UA: 50 mg/dL — AB
Hgb urine dipstick: NEGATIVE
Ketones, ur: NEGATIVE mg/dL
Leukocytes,Ua: NEGATIVE
Nitrite: NEGATIVE
Protein, ur: NEGATIVE mg/dL
Specific Gravity, Urine: 1.014 (ref 1.005–1.030)
pH: 6 (ref 5.0–8.0)

## 2018-05-30 LAB — CBC
HCT: 32.6 % — ABNORMAL LOW (ref 36.0–46.0)
Hemoglobin: 10.1 g/dL — ABNORMAL LOW (ref 12.0–15.0)
MCH: 30.2 pg (ref 26.0–34.0)
MCHC: 31 g/dL (ref 30.0–36.0)
MCV: 97.6 fL (ref 80.0–100.0)
Platelets: 616 10*3/uL — ABNORMAL HIGH (ref 150–400)
RBC: 3.34 MIL/uL — ABNORMAL LOW (ref 3.87–5.11)
RDW: 13.9 % (ref 11.5–15.5)
WBC: 6.5 10*3/uL (ref 4.0–10.5)
nRBC: 0 % (ref 0.0–0.2)

## 2018-05-30 LAB — PROTIME-INR
INR: 1 (ref 0.8–1.2)
Prothrombin Time: 12.8 seconds (ref 11.4–15.2)

## 2018-05-30 LAB — CBG MONITORING, ED: Glucose-Capillary: 86 mg/dL (ref 70–99)

## 2018-05-30 LAB — BASIC METABOLIC PANEL
Anion gap: 10 (ref 5–15)
BUN: 14 mg/dL (ref 8–23)
CO2: 23 mmol/L (ref 22–32)
Calcium: 9.1 mg/dL (ref 8.9–10.3)
Chloride: 99 mmol/L (ref 98–111)
Creatinine, Ser: 0.71 mg/dL (ref 0.44–1.00)
GFR calc Af Amer: >60 mL/min (ref >60)
GFR calc non Af Amer: >60 mL/min (ref >60)
Glucose, Bld: 118 mg/dL — ABNORMAL HIGH (ref 70–99)
Potassium: 4.1 mmol/L (ref 3.5–5.1)
Sodium: 132 mmol/L — ABNORMAL LOW (ref 135–145)
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
## 2018-05-30 MED ORDER — SODIUM CHLORIDE 0.9% FLUSH: 3.0000 mL | Freq: Once | INTRAVENOUS | Status: DC

## 2018-05-30 NOTE — ED Notes (Signed)
ED Provider at bedside. 
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
## 2018-05-30 NOTE — ED Notes (Signed)
Amber pts daughter [phone] in parking lot, wants an update
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
## 2018-05-31 ENCOUNTER — Inpatient Hospital Stay (HOSPITAL_COMMUNITY): Payer: Medicaid Other

## 2018-05-31 ENCOUNTER — Emergency Department (HOSPITAL_COMMUNITY): Payer: Medicaid Other

## 2018-05-31 DIAGNOSIS — R42 Dizziness and giddiness: Secondary | ICD-10-CM | POA: Diagnosis present

## 2018-05-31 DIAGNOSIS — S42402D Unspecified fracture of lower end of left humerus, subsequent encounter for fracture with routine healing: Secondary | ICD-10-CM

## 2018-05-31 DIAGNOSIS — I672 Cerebral atherosclerosis: Secondary | ICD-10-CM | POA: Diagnosis present

## 2018-05-31 DIAGNOSIS — Z8673 Personal history of transient ischemic attack (TIA), and cerebral infarction without residual deficits: Secondary | ICD-10-CM

## 2018-05-31 DIAGNOSIS — E1159 Type 2 diabetes mellitus with other circulatory complications: Secondary | ICD-10-CM | POA: Diagnosis not present

## 2018-05-31 DIAGNOSIS — I639 Cerebral infarction, unspecified: Secondary | ICD-10-CM | POA: Diagnosis present

## 2018-05-31 DIAGNOSIS — Z8249 Family history of ischemic heart disease and other diseases of the circulatory system: Secondary | ICD-10-CM | POA: Diagnosis not present

## 2018-05-31 DIAGNOSIS — G463 Brain stem stroke syndrome: Secondary | ICD-10-CM | POA: Diagnosis not present

## 2018-05-31 DIAGNOSIS — E785 Hyperlipidemia, unspecified: Secondary | ICD-10-CM

## 2018-05-31 DIAGNOSIS — R040 Epistaxis: Secondary | ICD-10-CM | POA: Diagnosis not present

## 2018-05-31 DIAGNOSIS — Z833 Family history of diabetes mellitus: Secondary | ICD-10-CM | POA: Diagnosis not present

## 2018-05-31 DIAGNOSIS — Z823 Family history of stroke: Secondary | ICD-10-CM | POA: Diagnosis not present

## 2018-05-31 DIAGNOSIS — I6381 Other cerebral infarction due to occlusion or stenosis of small artery: Secondary | ICD-10-CM | POA: Diagnosis not present

## 2018-05-31 DIAGNOSIS — S2242XD Multiple fractures of ribs, left side, subsequent encounter for fracture with routine healing: Secondary | ICD-10-CM | POA: Diagnosis not present

## 2018-05-31 DIAGNOSIS — D62 Acute posthemorrhagic anemia: Secondary | ICD-10-CM | POA: Diagnosis not present

## 2018-05-31 DIAGNOSIS — Z79891 Long term (current) use of opiate analgesic: Secondary | ICD-10-CM

## 2018-05-31 DIAGNOSIS — I10 Essential (primary) hypertension: Secondary | ICD-10-CM | POA: Diagnosis present

## 2018-05-31 DIAGNOSIS — R269 Unspecified abnormalities of gait and mobility: Secondary | ICD-10-CM | POA: Diagnosis not present

## 2018-05-31 DIAGNOSIS — Z79899 Other long term (current) drug therapy: Secondary | ICD-10-CM | POA: Diagnosis not present

## 2018-05-31 DIAGNOSIS — Z1159 Encounter for screening for other viral diseases: Secondary | ICD-10-CM | POA: Diagnosis not present

## 2018-05-31 DIAGNOSIS — Z716 Tobacco abuse counseling: Secondary | ICD-10-CM | POA: Diagnosis not present

## 2018-05-31 DIAGNOSIS — S2232XD Fracture of one rib, left side, subsequent encounter for fracture with routine healing: Secondary | ICD-10-CM

## 2018-05-31 DIAGNOSIS — S42352S Displaced comminuted fracture of shaft of humerus, left arm, sequela: Secondary | ICD-10-CM | POA: Diagnosis not present

## 2018-05-31 DIAGNOSIS — R260 Ataxic gait: Secondary | ICD-10-CM | POA: Diagnosis present

## 2018-05-31 DIAGNOSIS — E119 Type 2 diabetes mellitus without complications: Secondary | ICD-10-CM | POA: Diagnosis present

## 2018-05-31 DIAGNOSIS — I6340 Cerebral infarction due to embolism of unspecified cerebral artery: Secondary | ICD-10-CM | POA: Diagnosis present

## 2018-05-31 DIAGNOSIS — S82892D Other fracture of left lower leg, subsequent encounter for closed fracture with routine healing: Secondary | ICD-10-CM

## 2018-05-31 DIAGNOSIS — I69398 Other sequelae of cerebral infarction: Secondary | ICD-10-CM | POA: Diagnosis not present

## 2018-05-31 DIAGNOSIS — F17211 Nicotine dependence, cigarettes, in remission: Secondary | ICD-10-CM

## 2018-05-31 DIAGNOSIS — H538 Other visual disturbances: Secondary | ICD-10-CM

## 2018-05-31 DIAGNOSIS — I6389 Other cerebral infarction: Secondary | ICD-10-CM | POA: Diagnosis not present

## 2018-05-31 DIAGNOSIS — S42402S Unspecified fracture of lower end of left humerus, sequela: Secondary | ICD-10-CM | POA: Diagnosis not present

## 2018-05-31 DIAGNOSIS — F1290 Cannabis use, unspecified, uncomplicated: Secondary | ICD-10-CM | POA: Diagnosis present

## 2018-05-31 DIAGNOSIS — F17210 Nicotine dependence, cigarettes, uncomplicated: Secondary | ICD-10-CM | POA: Diagnosis present

## 2018-05-31 DIAGNOSIS — Z7984 Long term (current) use of oral hypoglycemic drugs: Secondary | ICD-10-CM

## 2018-05-31 DIAGNOSIS — R002 Palpitations: Secondary | ICD-10-CM | POA: Diagnosis present

## 2018-05-31 DIAGNOSIS — I679 Cerebrovascular disease, unspecified: Secondary | ICD-10-CM

## 2018-05-31 LAB — BASIC METABOLIC PANEL
Anion gap: 13 (ref 5–15)
BUN: 11 mg/dL (ref 8–23)
CO2: 22 mmol/L (ref 22–32)
Calcium: 9.5 mg/dL (ref 8.9–10.3)
Chloride: 102 mmol/L (ref 98–111)
Creatinine, Ser: 0.79 mg/dL (ref 0.44–1.00)
GFR calc Af Amer: >60 mL/min (ref >60)
GFR calc non Af Amer: >60 mL/min (ref >60)
Glucose, Bld: 163 mg/dL — ABNORMAL HIGH (ref 70–99)
Potassium: 4.4 mmol/L (ref 3.5–5.1)
Sodium: 137 mmol/L (ref 135–145)

## 2018-05-31 LAB — LIPID PANEL
Cholesterol: 227 mg/dL — ABNORMAL HIGH (ref 0–200)
HDL: 68 mg/dL (ref >40)
LDL Cholesterol: 134 mg/dL — ABNORMAL HIGH (ref 0–99)
Total CHOL/HDL Ratio: 3.3 RATIO
Triglycerides: 123 mg/dL (ref <150)
VLDL: 25 mg/dL (ref 0–40)

## 2018-05-31 LAB — GLUCOSE, CAPILLARY
Glucose-Capillary: 165 mg/dL — ABNORMAL HIGH (ref 70–99)
Glucose-Capillary: 171 mg/dL — ABNORMAL HIGH (ref 70–99)
Glucose-Capillary: 130 mg/dL — ABNORMAL HIGH (ref 70–99)
Glucose-Capillary: 132 mg/dL — ABNORMAL HIGH (ref 70–99)

## 2018-05-31 LAB — RAPID URINE DRUG SCREEN, HOSP PERFORMED
Amphetamines: NOT DETECTED
Barbiturates: NOT DETECTED
Benzodiazepines: NOT DETECTED
Cocaine: NOT DETECTED
Opiates: NOT DETECTED
Tetrahydrocannabinol: POSITIVE — AB

## 2018-05-31 LAB — HEMOGLOBIN A1C
Hgb A1c MFr Bld: 7.3 % — ABNORMAL HIGH (ref 4.8–5.6)
Mean Plasma Glucose: 162.81 mg/dL

## 2018-05-31 LAB — SARS CORONAVIRUS 2 BY RT PCR (HOSPITAL ORDER, PERFORMED IN ~~LOC~~ HOSPITAL LAB): SARS Coronavirus 2: NEGATIVE

## 2018-05-31 IMAGING — MR MRI HEAD WITHOUT CONTRAST
9 of 11 series · 32 of 48 positions shown · non-contrast
Comparison: Prior CT from [DATE].

CLINICAL DATA: Initial evaluation for intermittent dizziness and
headaches.

EXAM:
MRI HEAD WITHOUT CONTRAST
TECHNIQUE: Multiplanar, multiecho pulse sequences of the brain and surrounding
structures were obtained without intravenous contrast.

[Series 3: DWI · axial · 3.0mm · 0.94mm/px · z∈[-54,+91]mm · 7 of 100 slices shown (1 of 2)]
[im 1/100]
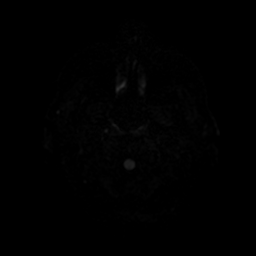
[im 17/100]
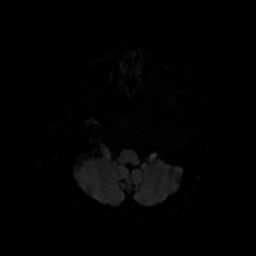
[im 34/100]
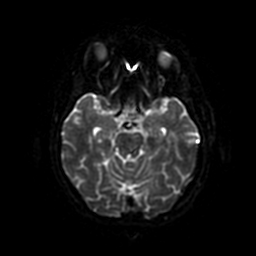
[im 50/100]
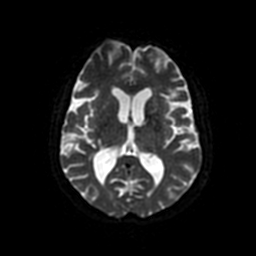
[im 67/100]
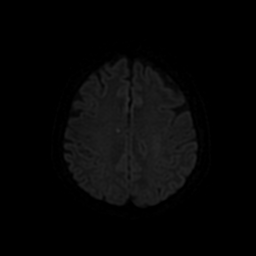
[im 83/100]
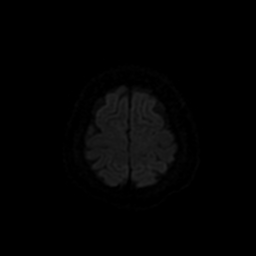
[im 100/100]
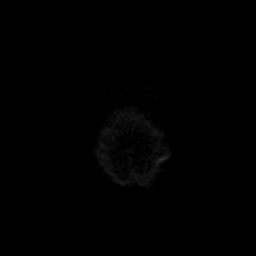

[Series 4: DWI · coronal · 4.0mm · 0.94mm/px · 6 of 72 slices shown (2 of 2)]
[im 1/72]
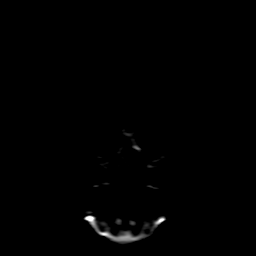
[im 15/72]
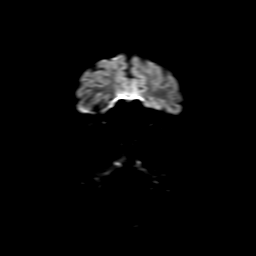
[im 29/72]
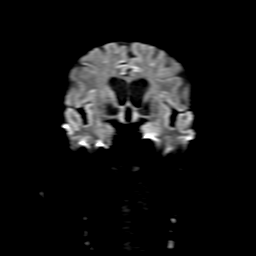
[im 43/72]
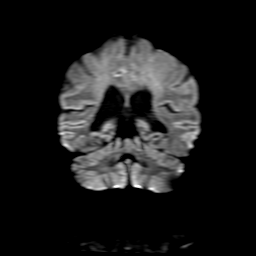
[im 57/72]
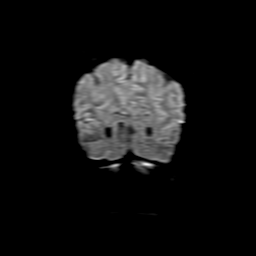
[im 72/72]
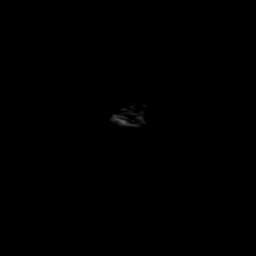

[Series 5: FLAIR · sagittal · 5.0mm · 0.47mm/px · 2 of 25 slices shown (1 of 2)]
[im 1/25]
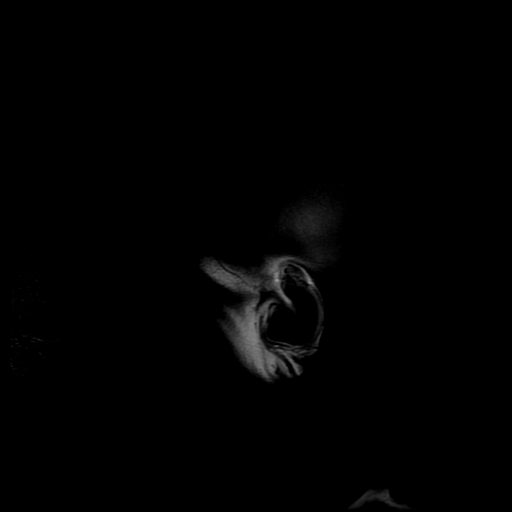
[im 25/25]
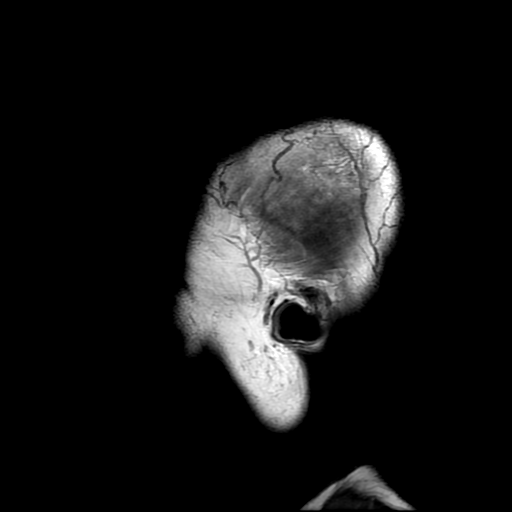

[Series 6: T2 · axial · 5.0mm · 0.45mm/px · z∈[-62,+86]mm · 2 of 26 slices shown (1 of 2)]
[im 1/26]
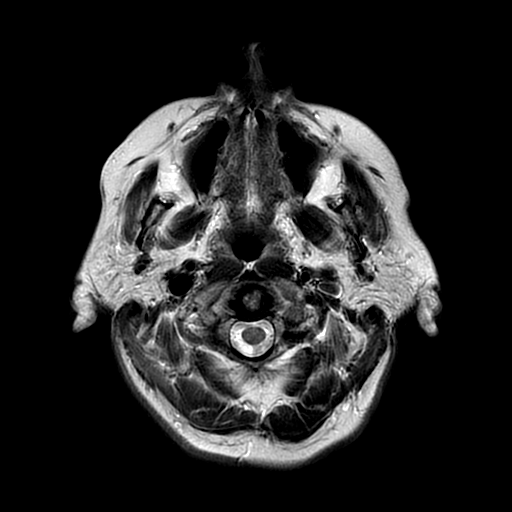
[im 26/26]
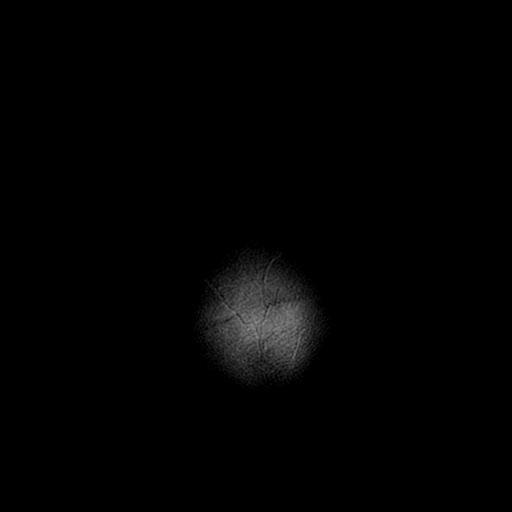

[Series 7: FLAIR · axial · 3.0mm · 0.45mm/px · z∈[-62,+86]mm · 2 of 26 slices shown (2 of 2)]
[im 1/26]
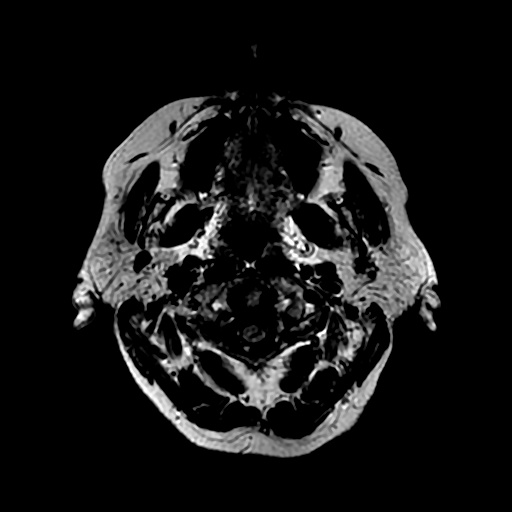
[im 26/26]
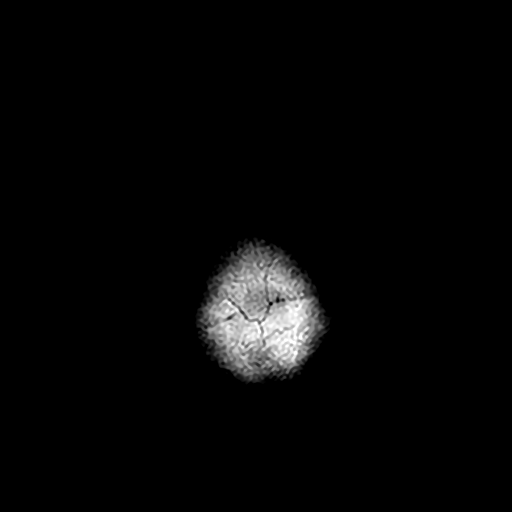

[Series 8: SWI · axial · 3.0mm · 0.47mm/px · z∈[-63,+2]mm · 4 of 104 slices shown]
[im 1/104]
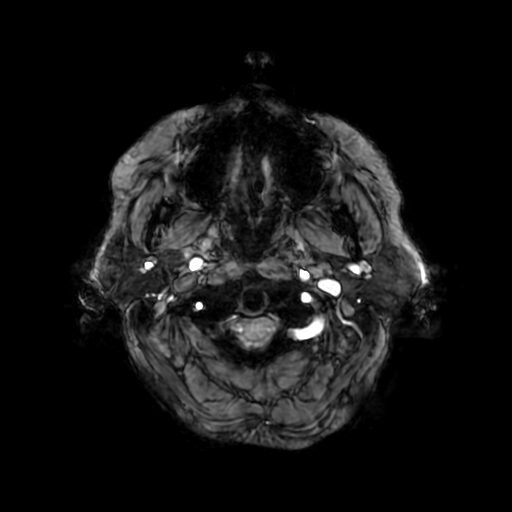
[im 15/104]
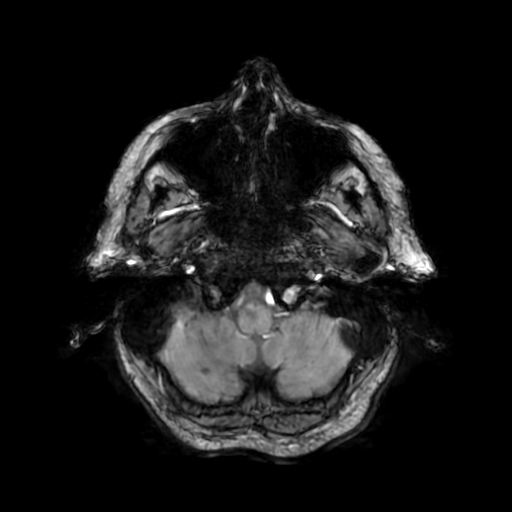
[im 30/104]
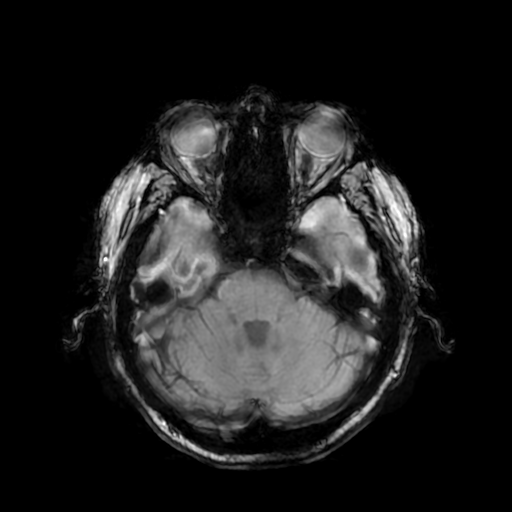
[im 45/104]
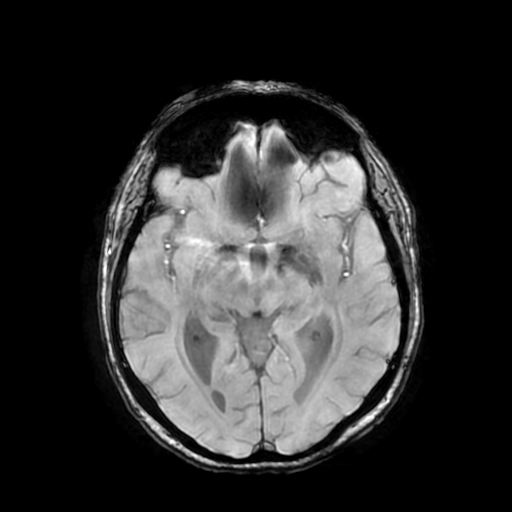

[Series 10: T2 · coronal · 5.0mm · 0.39mm/px · 2 of 29 slices shown (2 of 2)]
[im 1/29]
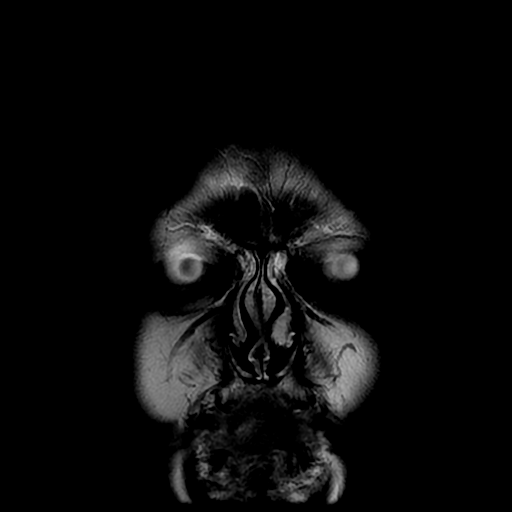
[im 29/29]
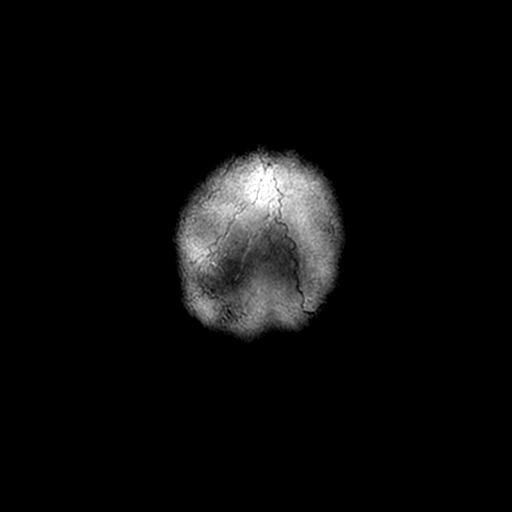

[Series 350: ADC · axial · 3.0mm · 0.94mm/px · z∈[-54,+91]mm · 4 of 50 slices shown (1 of 2)]
[im 1/50]
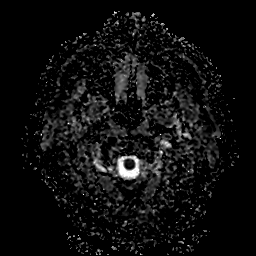
[im 17/50]
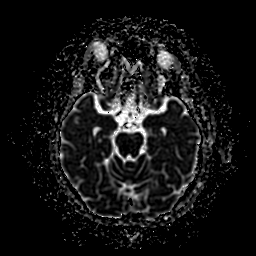
[im 33/50]
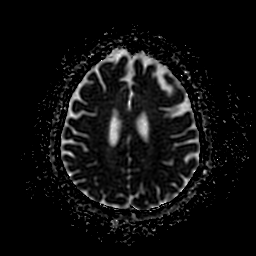
[im 50/50]
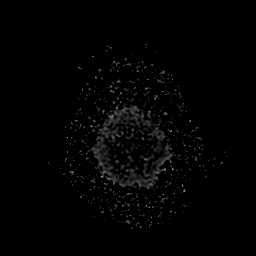

[Series 450: ADC · coronal · 4.0mm · 0.94mm/px · 3 of 36 slices shown (2 of 2)]
[im 1/36]
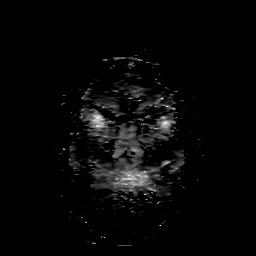
[im 18/36]
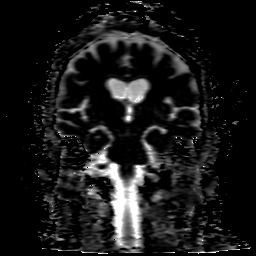
[im 36/36]
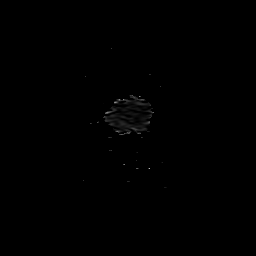

[32 of 48 positions shown; findings below may reference images not displayed]

FINDINGS: Brain: Diffuse prominence of the CSF containing spaces compatible
generalized age-related cerebral atrophy. Mild chronic microvascular
ischemic changes present within the periventricular white matter and
pons. Scattered superimposed remote lacunar infarcts present within
the hemispheric cerebral white matter bilaterally. Few small remote
right cerebellar infarcts.

7 mm focus of diffusion abnormality involving the left lateral
medulla compatible with acute ischemic medullary infarct (series 3,
image 71). No associated hemorrhage or mass effect. Additional
punctate 5 mm focus of diffusion abnormality within the subcortical
parasagittal right frontal lobe also compatible with a small acute
ischemic infarct (series 3, image 34). Vague diffusion abnormality
within the periventricular white matter adjacent to the frontal horn
of the left lateral ventricle felt to most likely reflect T2 shine
through related to chronic lacunar infarct at this location. No
other evidence for acute or subacute ischemia. Gray-white matter
differentiation otherwise maintained. No evidence for acute
intracranial hemorrhage. Small amount of chronic hemosiderin
staining noted within the right cerebellum and left frontal lobe.

No mass lesion, midline shift or mass effect. No hydrocephalus. No
extra-axial fluid collection. Pituitary gland suprasellar region
normal. Midline structures intact and normal.

Vascular: Major intracranial vascular flow voids are maintained.

Skull and upper cervical spine: Craniocervical junction normal.
Upper cervical spine within normal limits. No focal marrow replacing
lesion. Scalp soft tissues unremarkable.

Sinuses/Orbits: Globes and orbital soft tissues within normal
limits. Paranasal sinuses are clear. No significant mastoid
effusion. Inner ear structures grossly normal.

Other: None.
IMPRESSION: 1. 7 mm acute ischemic nonhemorrhagic lateral left medullary
infarct.
2. Additional 5 mm acute ischemic nonhemorrhagic infarct involving
the subcortical parasagittal left frontal lobe.
3. Underlying age-related cerebral atrophy with mild chronic small
vessel ischemic disease, with few additional scattered remote
lacunar infarcts as above.

## 2018-05-31 IMAGING — CT CT ANGIOGRAPHY HEAD
1 of 8 series · 6 of 33 positions shown · IV contrast (OMNI 350)
Comparison: Brain MRI from yesterday

CLINICAL DATA: Stroke follow-up

EXAM:
CT ANGIOGRAPHY HEAD AND NECK
TECHNIQUE: Multidetector CT imaging of the head and neck was performed using
the standard protocol during bolus administration of intravenous
contrast. Multiplanar CT image reconstructions and MIPs were
obtained to evaluate the vascular anatomy. Carotid stenosis
measurements (when applicable) are obtained utilizing NASCET
criteria, using the distal internal carotid diameter as the
denominator.
CONTRAST:  50mL OMNIPAQUE IOHEXOL 350 MG/ML SOLN

[Series 7: cta neck axial · axial · 0.43mm/px · z∈[-302,-59]mm · 6 of 339 slices shown]
[im 49/339  soft-tissue]
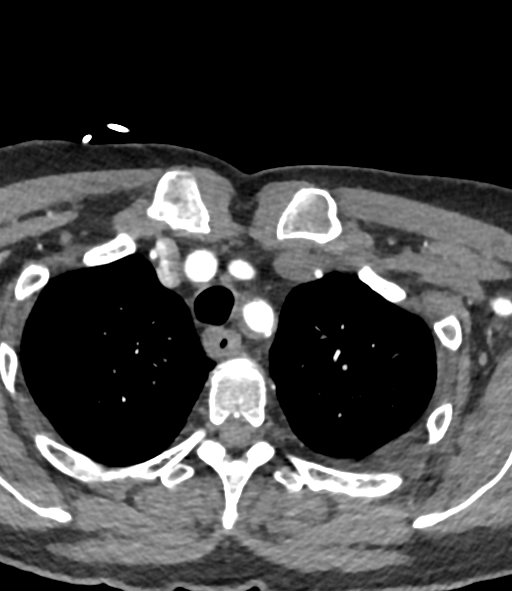
[im 97/339  bone]
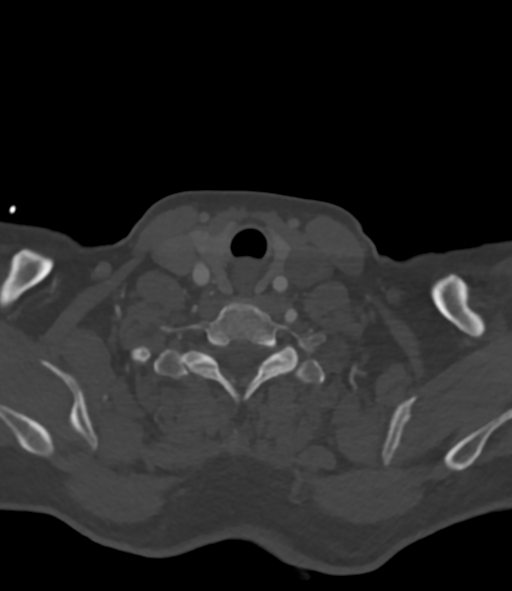
[im 145/339  soft-tissue]
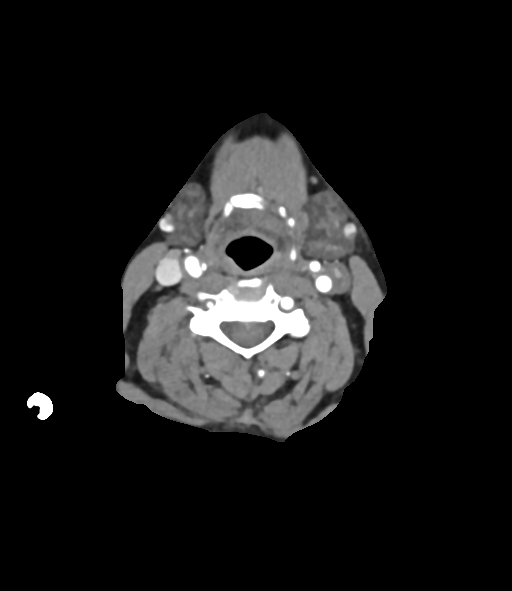
[im 194/339  bone]
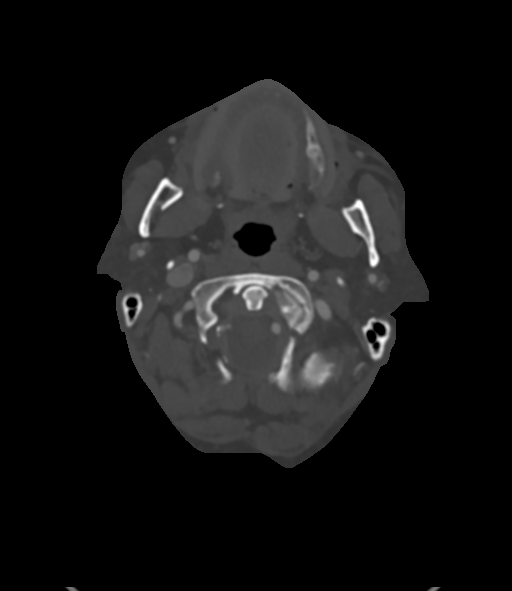
[im 242/339  soft-tissue]
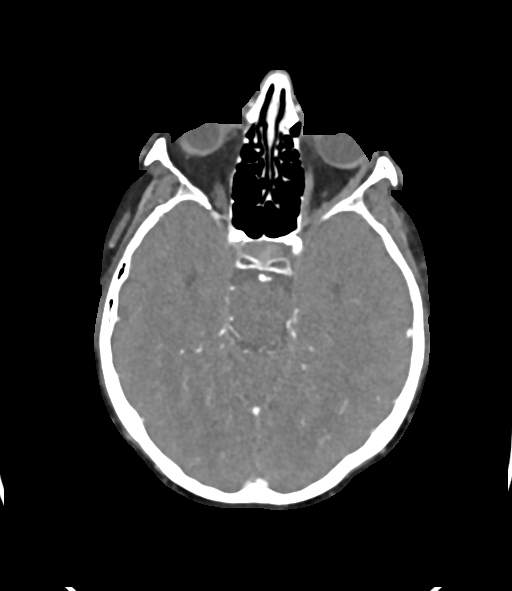
[im 290/339  bone]
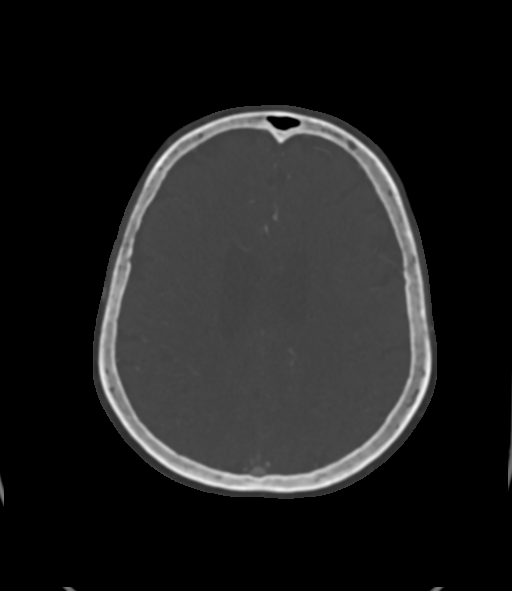

[6 of 33 positions shown; findings below may reference images not displayed]

FINDINGS: CTA NECK FINDINGS

Aortic arch: Atherosclerotic calcification.  Three vessel branching.

Right carotid system: Moderate mixed density plaque at the
bifurcation without flow limiting stenosis or ulceration. There is
ICA tortuosity with nonobstructive kink.

Left carotid system: Mild to moderate plaque at the bifurcation
without stenosis or ulceration. Separate mixed density plaque at the
skull base without flow limiting stenosis.

Vertebral arteries: Proximal subclavian atherosclerosis on the left.
No flow limiting subclavian stenosis. The left vertebral artery is
dominant. Intermittent atheromatous plaque seen on the right
vertebral artery with moderate V1 and V2 segment stenoses. There is
a high-grade stenosis due to calcified plaque at the dural
penetration.

Skeleton: Posterior left third and fourth rib fractures.

Other neck: No acute finding

Upper chest: Left pleural effusion that is small volume where
visualized.

Review of the MIP images confirms the above findings

CTA HEAD FINDINGS

Anterior circulation: Extensive atherosclerotic plaque of the
bilateral carotid siphons.~ 70% stenosis at the right anterior
genu, quantification limited by degree of calcification blooming.
There is at least 50% stenosis in the left cavernous segment. There
is atheromatous type irregularity of bilateral MCA branches without
branch occlusion. Moderate to advanced narrowing seen at right M1 2
segment junction.

Posterior circulation: Dominant left vertebral artery.
Atherosclerotic plaque of the right V4 segment at the dura with
advanced stenosis on coronal reformats. There is extensive
atherosclerotic irregularity of the right P2 segment. Left PICA is
patent.

Venous sinuses: Patent. The non dominant left transverse sigmoid
sinus opacifies on the delayed phase.

Anatomic variants: None significant.

Delayed phase: No abnormal intracranial enhancement

Review of the MIP images confirms the above findings
IMPRESSION: 1. No emergent vascular finding.
2. Atherosclerosis in the neck without flow limiting carotid
stenosis. There is multifocal moderate to advanced narrowing of the
non dominant right vertebral artery.
3. Prominent intracranial atherosclerosis with bilateral cavernous
carotid stenosis measuring up to 70% on the right. High-grade right
M2 and right P2 segment stenoses.
4. Healing left third and fourth rib fractures.

## 2018-05-31 MED ORDER — OXYCODONE HCL 5 MG PO TABS
10.0000 mg | ORAL_TABLET | Freq: Four times a day (QID) | ORAL | Status: DC | PRN
  Administered 2018-05-31 (×3): 10 mg via ORAL
  Filled 2018-05-31 (×3): qty 2

## 2018-05-31 MED ORDER — INSULIN ASPART 100 UNIT/ML ~~LOC~~ SOLN
0.0000 [IU] | Freq: Three times a day (TID) | SUBCUTANEOUS | Status: DC
  Administered 2018-05-31: 17:00:00 1 [IU] via SUBCUTANEOUS
  Administered 2018-05-31 – 2018-06-02 (×7): 2 [IU] via SUBCUTANEOUS

## 2018-05-31 MED ORDER — ATORVASTATIN CALCIUM 40 MG PO TABS
40.0000 mg | ORAL_TABLET | Freq: Every day | ORAL | Status: DC
  Administered 2018-06-01: 40 mg via ORAL
  Filled 2018-05-31: qty 1

## 2018-05-31 MED ORDER — ONDANSETRON 4 MG PO TBDP
4.0000 mg | ORAL_TABLET | Freq: Four times a day (QID) | ORAL | Status: DC | PRN
  Filled 2018-05-31: qty 1

## 2018-05-31 MED ORDER — ACETAMINOPHEN 325 MG PO TABS
650.0000 mg | ORAL_TABLET | Freq: Four times a day (QID) | ORAL | Status: DC | PRN
  Administered 2018-06-01: 09:00:00 650 mg via ORAL
  Filled 2018-05-31: qty 2

## 2018-05-31 MED ORDER — GABAPENTIN 100 MG PO CAPS
100.0000 mg | ORAL_CAPSULE | Freq: Three times a day (TID) | ORAL | Status: DC
  Administered 2018-05-31 – 2018-06-02 (×7): 100 mg via ORAL
  Filled 2018-05-31 (×7): qty 1

## 2018-05-31 MED ORDER — ONDANSETRON HCL 4 MG/2ML IJ SOLN: 4.0000 mg | Freq: Four times a day (QID) | INTRAMUSCULAR | Status: DC | PRN

## 2018-05-31 MED ORDER — CLOPIDOGREL BISULFATE 75 MG PO TABS
75.0000 mg | ORAL_TABLET | Freq: Every day | ORAL | Status: DC
  Administered 2018-05-31 – 2018-06-02 (×3): 75 mg via ORAL
  Filled 2018-05-31 (×3): qty 1

## 2018-05-31 MED ORDER — ACETAMINOPHEN 650 MG RE SUPP: 650.0000 mg | Freq: Four times a day (QID) | RECTAL | Status: DC | PRN

## 2018-05-31 MED ORDER — ASPIRIN EC 81 MG PO TBEC
81.0000 mg | DELAYED_RELEASE_TABLET | Freq: Every day | ORAL | Status: DC
  Administered 2018-06-01 – 2018-06-02 (×2): 81 mg via ORAL
  Filled 2018-05-31 (×2): qty 1

## 2018-05-31 MED ORDER — ASPIRIN EC 325 MG PO TBEC
325.0000 mg | DELAYED_RELEASE_TABLET | Freq: Once | ORAL | Status: AC
  Administered 2018-05-31: 325 mg via ORAL
  Filled 2018-05-31: qty 1

## 2018-05-31 MED ORDER — SENNOSIDES-DOCUSATE SODIUM 8.6-50 MG PO TABS: 1.0000 | ORAL_TABLET | Freq: Every evening | ORAL | Status: DC | PRN

## 2018-05-31 MED ORDER — ATORVASTATIN CALCIUM 80 MG PO TABS
80.0000 mg | ORAL_TABLET | Freq: Every day | ORAL | Status: DC
  Administered 2018-05-31: 80 mg via ORAL
  Filled 2018-05-31: qty 1

## 2018-05-31 MED ORDER — VENLAFAXINE HCL ER 75 MG PO CP24
75.0000 mg | ORAL_CAPSULE | Freq: Every day | ORAL | Status: DC
  Administered 2018-05-31 – 2018-06-01 (×2): 75 mg via ORAL
  Filled 2018-05-31 (×2): qty 1

## 2018-05-31 MED ORDER — ENOXAPARIN SODIUM 40 MG/0.4ML ~~LOC~~ SOLN
40.0000 mg | SUBCUTANEOUS | Status: DC
  Administered 2018-05-31 – 2018-06-02 (×3): 40 mg via SUBCUTANEOUS
  Filled 2018-05-31 (×3): qty 0.4

## 2018-05-31 MED ORDER — IOHEXOL 350 MG/ML SOLN
50.0000 mL | Freq: Once | INTRAVENOUS | Status: AC | PRN
  Administered 2018-05-31: 50 mL via INTRAVENOUS

## 2018-05-31 MED ORDER — SODIUM CHLORIDE 0.9% FLUSH
10.0000 mL | INTRAVENOUS | Status: DC | PRN
  Administered 2018-06-02: 10 mL
  Filled 2018-05-31: qty 40

## 2018-05-31 MED ORDER — SODIUM CHLORIDE 0.9% FLUSH
10.0000 mL | Freq: Two times a day (BID) | INTRAVENOUS | Status: DC
  Administered 2018-06-01 – 2018-06-02 (×3): 10 mL
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
## 2018-05-31 NOTE — Progress Notes (Signed)
Rehab Admissions Coordinator Note:  Patient was screened by Clois Dupes for appropriateness for an Inpatient Acute Rehab Consult per PT recs.  At this time, we are recommending Inpatient Rehab consult if pt would like to be considered for admit. Please advise.  Clois Dupes RN MSN 05/31/2018, 5:39 PM  I can be reached at [phone].
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
## 2018-05-31 NOTE — ED Notes (Signed)
RN attempted to call report; RN to call back; 

## 2018-05-31 NOTE — Progress Notes (Signed)
Per chart review, patient passed bedside swallow evaluation at 0453. Resident paged to place appropriate diet for patient.   Lyndal Pulley, RN 05/31/2018 7:52 AM

## 2018-05-31 NOTE — Progress Notes (Signed)
Called patient's daughter Joice Lofts and updated her on pt's status. All questions were addressed.
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
## 2018-05-31 NOTE — Progress Notes (Signed)
*  PRELIMINARY RESULTS* Echocardiogram 2D Echocardiogram WITH BUBBLE STUDY has been performed.  Jeryl Columbia 05/31/2018, 3:30 PM

## 2018-05-31 NOTE — ED Notes (Signed)
Dr. Kirkpatrick at bedside 
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
## 2018-05-31 NOTE — ED Notes (Signed)
Patient transported to MRI 
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
## 2018-05-31 NOTE — ED Notes (Signed)
Admitting at bedside 

## 2018-06-01 ENCOUNTER — Inpatient Hospital Stay (HOSPITAL_COMMUNITY): Payer: Medicaid Other

## 2018-06-01 ENCOUNTER — Ambulatory Visit: Payer: Self-pay | Admitting: Family Medicine

## 2018-06-01 ENCOUNTER — Encounter (HOSPITAL_COMMUNITY)
Admission: EM | Disposition: A | Discharge status: Rehabilitation Facility | Payer: Self-pay | Source: Home / Self Care | Attending: Internal Medicine

## 2018-06-01 DIAGNOSIS — Z87891 Personal history of nicotine dependence: Secondary | ICD-10-CM

## 2018-06-01 DIAGNOSIS — I639 Cerebral infarction, unspecified: Secondary | ICD-10-CM

## 2018-06-01 DIAGNOSIS — I6389 Other cerebral infarction: Secondary | ICD-10-CM

## 2018-06-01 HISTORY — PX: LOOP RECORDER INSERTION: EP1214

## 2018-06-01 LAB — GLUCOSE, CAPILLARY
Glucose-Capillary: 178 mg/dL — ABNORMAL HIGH (ref 70–99)
Glucose-Capillary: 153 mg/dL — ABNORMAL HIGH (ref 70–99)
Glucose-Capillary: 178 mg/dL — ABNORMAL HIGH (ref 70–99)
Glucose-Capillary: 123 mg/dL — ABNORMAL HIGH (ref 70–99)

## 2018-06-01 SURGERY — LOOP RECORDER INSERTION

## 2018-06-01 MED ORDER — STROKE: EARLY STAGES OF RECOVERY BOOK
Freq: Once | Status: AC
  Administered 2018-06-01: 16:00:00
  Filled 2018-06-01: qty 1

## 2018-06-01 MED ORDER — TRAMADOL HCL 50 MG PO TABS
50.0000 mg | ORAL_TABLET | Freq: Four times a day (QID) | ORAL | Status: DC | PRN
  Administered 2018-06-01 – 2018-06-02 (×4): 50 mg via ORAL
  Filled 2018-06-01 (×4): qty 1

## 2018-06-01 MED ORDER — TRAMADOL HCL 50 MG PO TABS: 100.0000 mg | ORAL_TABLET | Freq: Two times a day (BID) | ORAL | Status: DC

## 2018-06-01 MED ORDER — LIDOCAINE-EPINEPHRINE 1 %-1:100000 IJ SOLN
INTRAMUSCULAR | Status: DC | PRN
  Administered 2018-06-01: 20 mL

## 2018-06-01 MED ORDER — LISINOPRIL 10 MG PO TABS
10.0000 mg | ORAL_TABLET | Freq: Every day | ORAL | Status: DC
  Administered 2018-06-01 – 2018-06-02 (×2): 10 mg via ORAL
  Filled 2018-06-01 (×2): qty 1

## 2018-06-01 MED ORDER — LIDOCAINE-EPINEPHRINE 1 %-1:100000 IJ SOLN
INTRAMUSCULAR | Status: AC
  Filled 2018-06-01: qty 1

## 2018-06-01 SURGICAL SUPPLY — 2 items
LOOP REVEAL LINQSYS (Prosthesis & Implant Heart) ×2 IMPLANT
PACK LOOP INSERTION (CUSTOM PROCEDURE TRAY) ×3 IMPLANT
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
## 2018-06-01 NOTE — Progress Notes (Signed)
Lower extremity venous has been completed.   Preliminary results in CV Proc.   Blanch Media 06/01/2018 4:17 PM
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
## 2018-06-01 NOTE — Progress Notes (Signed)
Inpatient Rehabilitation Admissions Coordinator  Inpatient Rehab Consult received. I met with patient at the bedside for rehabilitation assessment. We discussed goals and expectations of an inpatient rehab admission.  Patient would like to talk with her family concerning cost of admit for she is uninsured. I have left a voicemail with financial counselor to also discuss with patient any discounts or programs that she may be eligible for. I will follow up tomorrow.  Danne Baxter, RN, MSN Rehab Admissions Coordinator [phone] 06/01/2018 5:35 PM

## 2018-06-01 NOTE — Progress Notes (Signed)
SLP Cancellation Note  Patient Details Name: CONCHA LEO MRN: [id number] DOB: [birth year]   Cancelled treatment:       Reason Eval/Treat Not Completed: Patient at procedure or test/unavailable. Will attempt next date.   Elio Forget Tarrell 06/01/2018, 3:51 PM   Angela Nevin, MA, CCC-SLP Speech Therapy [facility] Acute Rehab Pager: [phone]
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
## 2018-06-01 NOTE — Progress Notes (Signed)
Informed consent obtained for loop recorder placement. Placed on chart.   Lyndal Pulley, RN 06/01/2018 11:10 AM
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
## 2018-06-02 ENCOUNTER — Other Ambulatory Visit: Payer: Self-pay

## 2018-06-02 ENCOUNTER — Inpatient Hospital Stay (HOSPITAL_COMMUNITY)
Admission: RE | Admit: 2018-06-02 | Discharge: 2018-06-12 | DRG: 057 | Disposition: A | Discharge status: Home Health | Payer: Medicaid Other | Source: Intra-hospital | Attending: Physical Medicine & Rehabilitation | Admitting: Physical Medicine & Rehabilitation

## 2018-06-02 ENCOUNTER — Encounter (HOSPITAL_COMMUNITY): Payer: Self-pay | Admitting: Internal Medicine

## 2018-06-02 ENCOUNTER — Inpatient Hospital Stay (HOSPITAL_COMMUNITY): Payer: Medicaid Other

## 2018-06-02 DIAGNOSIS — E119 Type 2 diabetes mellitus without complications: Secondary | ICD-10-CM | POA: Diagnosis not present

## 2018-06-02 DIAGNOSIS — I10 Essential (primary) hypertension: Secondary | ICD-10-CM | POA: Diagnosis present

## 2018-06-02 DIAGNOSIS — R269 Unspecified abnormalities of gait and mobility: Secondary | ICD-10-CM | POA: Diagnosis not present

## 2018-06-02 DIAGNOSIS — T148XXA Other injury of unspecified body region, initial encounter: Secondary | ICD-10-CM

## 2018-06-02 DIAGNOSIS — I69398 Other sequelae of cerebral infarction: Secondary | ICD-10-CM | POA: Diagnosis not present

## 2018-06-02 DIAGNOSIS — E785 Hyperlipidemia, unspecified: Secondary | ICD-10-CM | POA: Diagnosis present

## 2018-06-02 DIAGNOSIS — Z79899 Other long term (current) drug therapy: Secondary | ICD-10-CM | POA: Diagnosis not present

## 2018-06-02 DIAGNOSIS — R040 Epistaxis: Secondary | ICD-10-CM | POA: Diagnosis not present

## 2018-06-02 DIAGNOSIS — S2242XD Multiple fractures of ribs, left side, subsequent encounter for fracture with routine healing: Secondary | ICD-10-CM

## 2018-06-02 DIAGNOSIS — Z7902 Long term (current) use of antithrombotics/antiplatelets: Secondary | ICD-10-CM

## 2018-06-02 DIAGNOSIS — S82892D Other fracture of left lower leg, subsequent encounter for closed fracture with routine healing: Secondary | ICD-10-CM | POA: Diagnosis not present

## 2018-06-02 DIAGNOSIS — S42402S Unspecified fracture of lower end of left humerus, sequela: Secondary | ICD-10-CM

## 2018-06-02 DIAGNOSIS — S52202D Unspecified fracture of shaft of left ulna, subsequent encounter for closed fracture with routine healing: Secondary | ICD-10-CM | POA: Diagnosis not present

## 2018-06-02 DIAGNOSIS — Z7984 Long term (current) use of oral hypoglycemic drugs: Secondary | ICD-10-CM | POA: Diagnosis not present

## 2018-06-02 DIAGNOSIS — D62 Acute posthemorrhagic anemia: Secondary | ICD-10-CM | POA: Diagnosis present

## 2018-06-02 DIAGNOSIS — G464 Cerebellar stroke syndrome: Secondary | ICD-10-CM | POA: Diagnosis present

## 2018-06-02 DIAGNOSIS — S42402D Unspecified fracture of lower end of left humerus, subsequent encounter for fracture with routine healing: Secondary | ICD-10-CM | POA: Diagnosis not present

## 2018-06-02 DIAGNOSIS — H538 Other visual disturbances: Secondary | ICD-10-CM | POA: Diagnosis present

## 2018-06-02 DIAGNOSIS — Z7982 Long term (current) use of aspirin: Secondary | ICD-10-CM | POA: Diagnosis not present

## 2018-06-02 DIAGNOSIS — S42492B Other displaced fracture of lower end of left humerus, initial encounter for open fracture: Secondary | ICD-10-CM

## 2018-06-02 DIAGNOSIS — F17210 Nicotine dependence, cigarettes, uncomplicated: Secondary | ICD-10-CM | POA: Diagnosis present

## 2018-06-02 DIAGNOSIS — G463 Brain stem stroke syndrome: Secondary | ICD-10-CM

## 2018-06-02 DIAGNOSIS — S42352S Displaced comminuted fracture of shaft of humerus, left arm, sequela: Secondary | ICD-10-CM | POA: Diagnosis not present

## 2018-06-02 DIAGNOSIS — R260 Ataxic gait: Secondary | ICD-10-CM

## 2018-06-02 DIAGNOSIS — E1142 Type 2 diabetes mellitus with diabetic polyneuropathy: Secondary | ICD-10-CM | POA: Diagnosis present

## 2018-06-02 LAB — GLUCOSE, CAPILLARY
Glucose-Capillary: 156 mg/dL — ABNORMAL HIGH (ref 70–99)
Glucose-Capillary: 166 mg/dL — ABNORMAL HIGH (ref 70–99)

## 2018-06-02 IMAGING — CR LEFT ELBOW - 2 VIEW
2 series · 2 of 2 positions shown · non-contrast
Comparison: None.

CLINICAL DATA: 62-year-old female with prior surgery

EXAM:
LEFT ELBOW - 2 VIEW

[elbow ap]
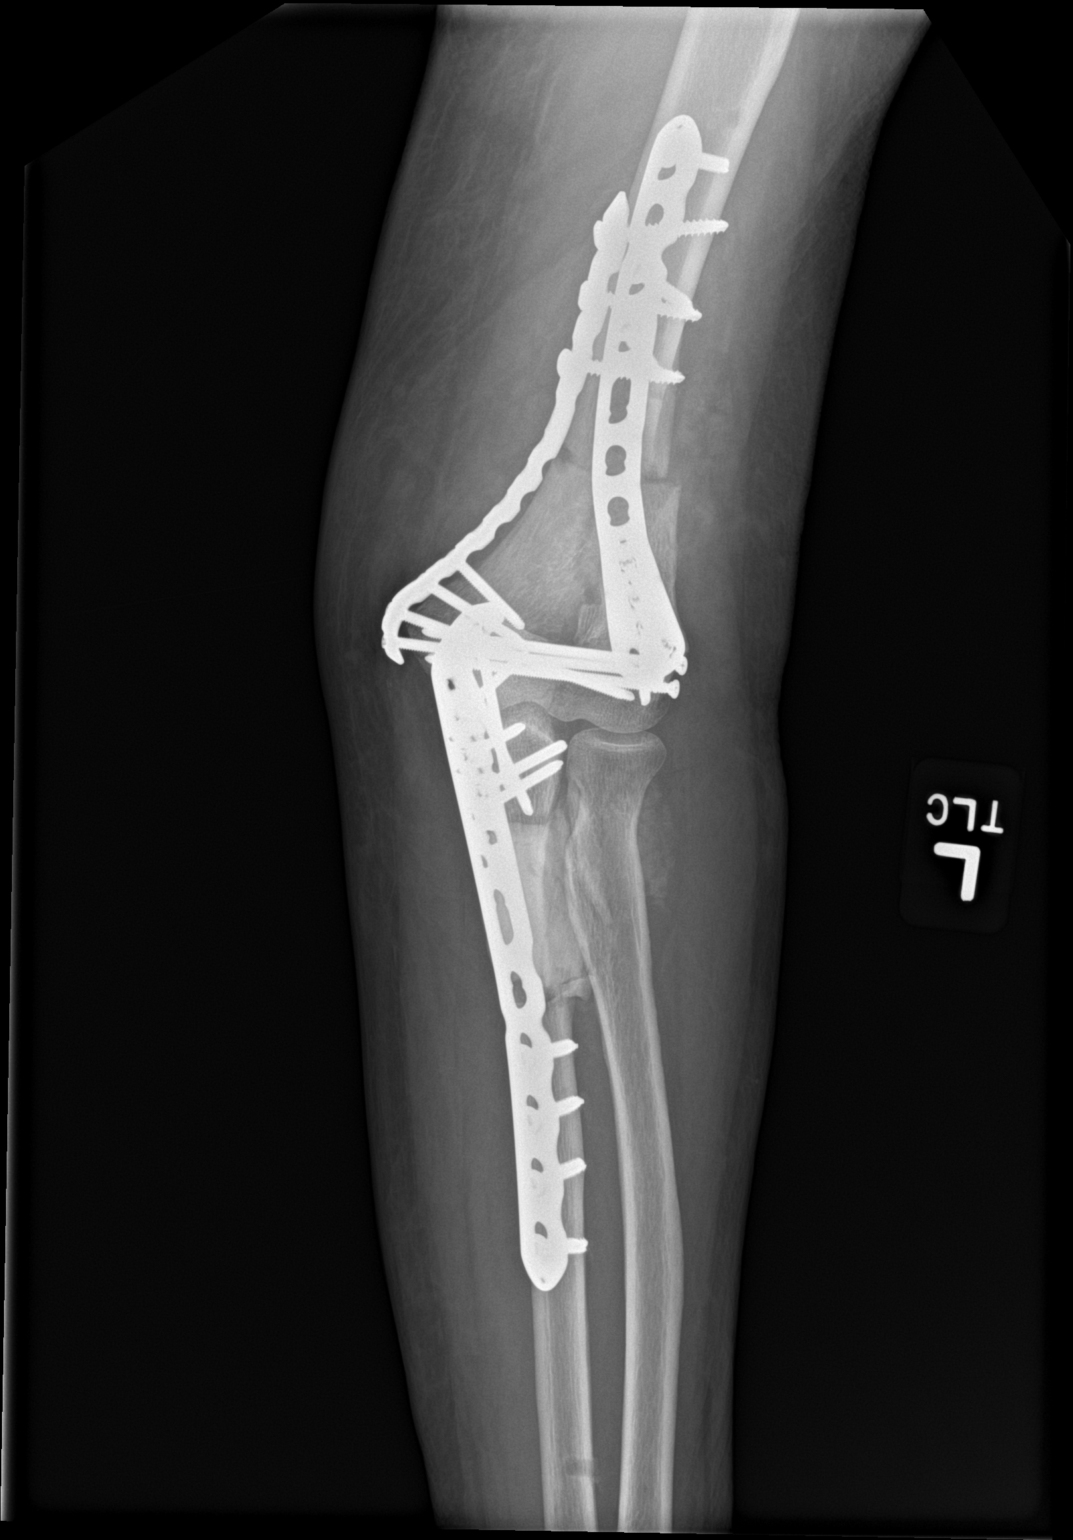

[elbow lat]
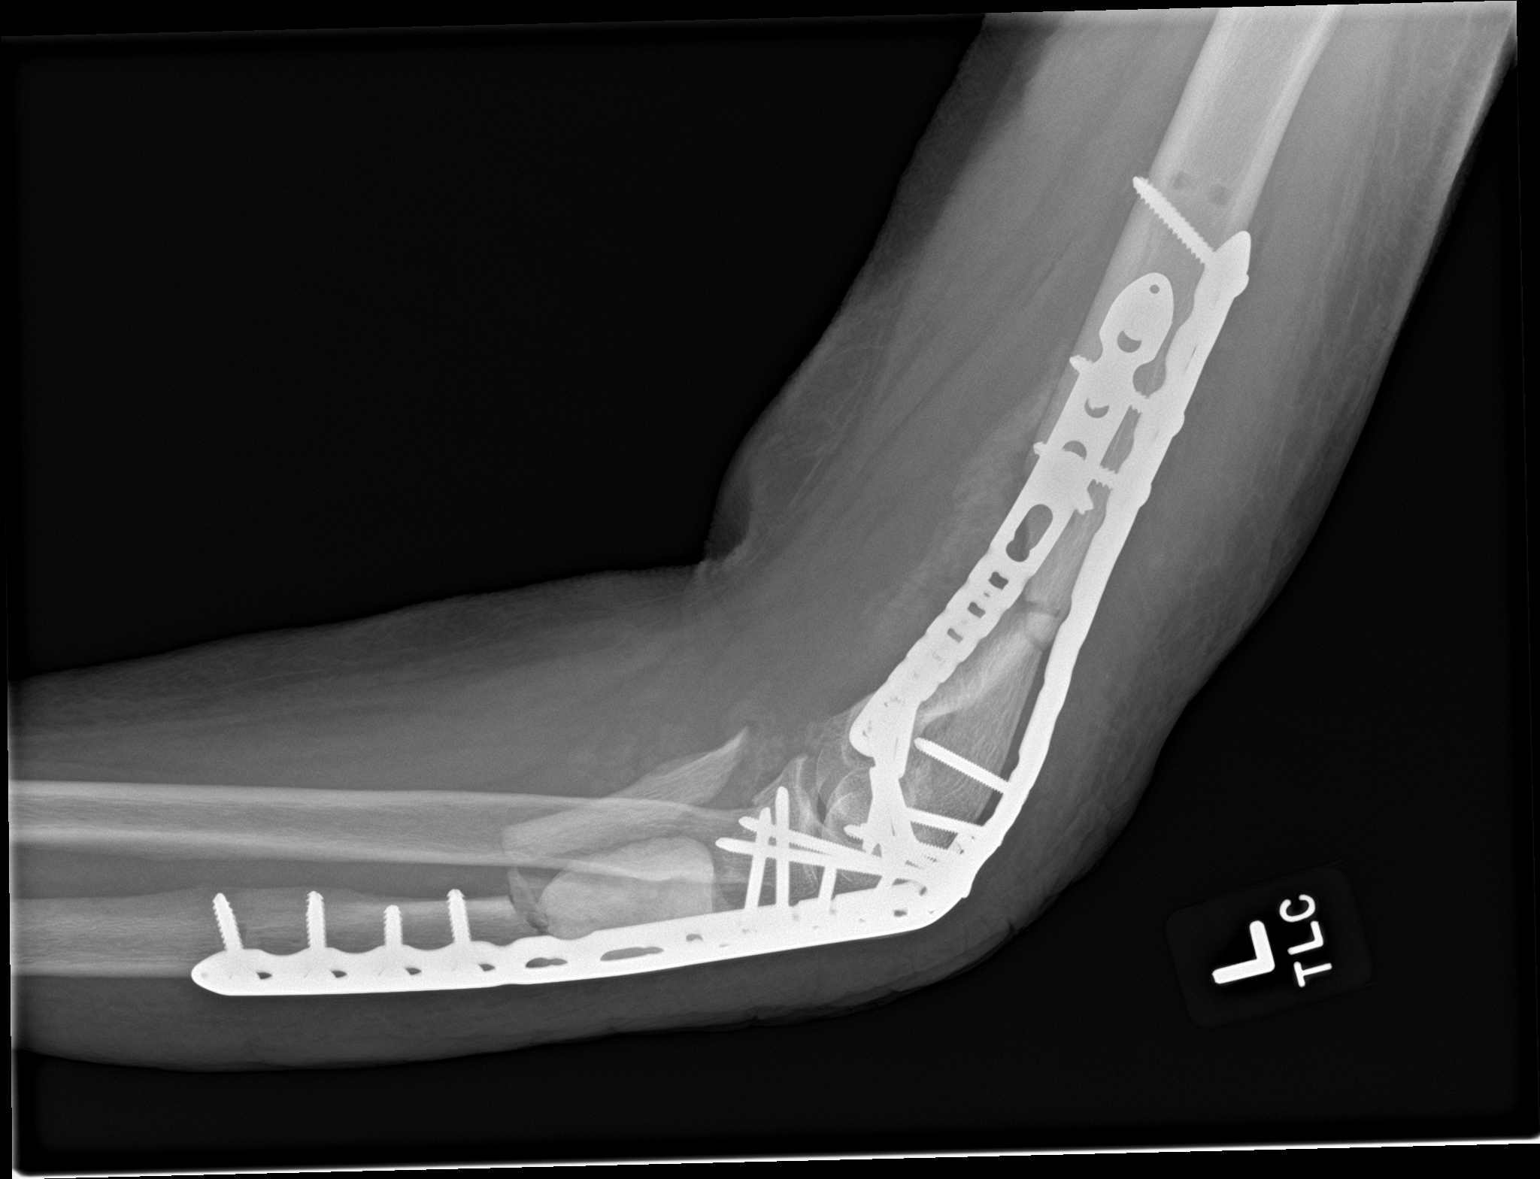

[2 of 2 positions shown; findings below may reference images not displayed]

FINDINGS: Buttress plate and screw fixation of distal humerus fracture site,
as well as plate and screw fixation of proximal ulnar fracture.

No hardware fracture identified at the humerus or ulna. No
loosening.

Anatomic alignment maintained.

Bone graft material present at both fracture sites with no
significant callus formation or remodeling.
IMPRESSION: Surgical changes of buttress plate and screw fixation of distal
humerus fracture site with alignment maintained. Bone graft material
present with no significant callus formation/remodeling.

Surgical changes of plate screw fixation of proximal ulna fracture
with relative alignment maintained. Bone graft material present
without significant callus formation/remodeling.

## 2018-06-02 MED ORDER — INSULIN ASPART 100 UNIT/ML ~~LOC~~ SOLN
0.0000 [IU] | Freq: Three times a day (TID) | SUBCUTANEOUS | Status: DC
  Administered 2018-06-03 (×2): 1 [IU] via SUBCUTANEOUS
  Administered 2018-06-03: 18:00:00 2 [IU] via SUBCUTANEOUS
  Administered 2018-06-04: 12:00:00 1 [IU] via SUBCUTANEOUS
  Administered 2018-06-04: 17:00:00 2 [IU] via SUBCUTANEOUS
  Administered 2018-06-04: 3 [IU] via SUBCUTANEOUS
  Administered 2018-06-05 – 2018-06-07 (×5): 2 [IU] via SUBCUTANEOUS
  Administered 2018-06-07 – 2018-06-08 (×2): 1 [IU] via SUBCUTANEOUS
  Administered 2018-06-08 – 2018-06-09 (×2): 2 [IU] via SUBCUTANEOUS
  Administered 2018-06-09: 13:00:00 1 [IU] via SUBCUTANEOUS
  Administered 2018-06-09: 17:00:00 2 [IU] via SUBCUTANEOUS
  Administered 2018-06-10: 12:00:00 3 [IU] via SUBCUTANEOUS
  Administered 2018-06-10 – 2018-06-11 (×3): 2 [IU] via SUBCUTANEOUS
  Administered 2018-06-11: 1 [IU] via SUBCUTANEOUS
  Administered 2018-06-11: 12:00:00 5 [IU] via SUBCUTANEOUS
  Administered 2018-06-12: 2 [IU] via SUBCUTANEOUS

## 2018-06-02 MED ORDER — TRAMADOL HCL 50 MG PO TABS
50.0000 mg | ORAL_TABLET | Freq: Four times a day (QID) | ORAL | Status: DC | PRN
  Administered 2018-06-02 – 2018-06-12 (×18): 50 mg via ORAL
  Filled 2018-06-02 (×19): qty 1

## 2018-06-02 MED ORDER — ONDANSETRON HCL 4 MG/2ML IJ SOLN: 4.0000 mg | Freq: Four times a day (QID) | INTRAMUSCULAR | Status: DC | PRN

## 2018-06-02 MED ORDER — ATORVASTATIN CALCIUM 40 MG PO TABS: 40.0000 mg | ORAL_TABLET | Freq: Every day | ORAL | Status: DC

## 2018-06-02 MED ORDER — ACETAMINOPHEN 650 MG RE SUPP: 650.0000 mg | Freq: Four times a day (QID) | RECTAL | Status: DC | PRN

## 2018-06-02 MED ORDER — OXYCODONE HCL 5 MG PO TABS
10.0000 mg | ORAL_TABLET | Freq: Four times a day (QID) | ORAL | Status: DC | PRN
  Administered 2018-06-02 – 2018-06-11 (×9): 10 mg via ORAL
  Filled 2018-06-02 (×11): qty 2

## 2018-06-02 MED ORDER — ENOXAPARIN SODIUM 40 MG/0.4ML ~~LOC~~ SOLN: 40.0000 mg | SUBCUTANEOUS | Status: DC

## 2018-06-02 MED ORDER — ENOXAPARIN SODIUM 40 MG/0.4ML ~~LOC~~ SOLN
40.0000 mg | SUBCUTANEOUS | Status: DC
  Administered 2018-06-03 – 2018-06-05 (×3): 40 mg via SUBCUTANEOUS
  Filled 2018-06-02 (×3): qty 0.4

## 2018-06-02 MED ORDER — CLOPIDOGREL BISULFATE 75 MG PO TABS
75.0000 mg | ORAL_TABLET | Freq: Every day | ORAL | Status: DC
  Administered 2018-06-03 – 2018-06-12 (×10): 75 mg via ORAL
  Filled 2018-06-02 (×10): qty 1

## 2018-06-02 MED ORDER — LISINOPRIL 10 MG PO TABS: 20.0000 mg | ORAL_TABLET | Freq: Every day | ORAL | 1 refills | Status: DC

## 2018-06-02 MED ORDER — ASPIRIN EC 81 MG PO TBEC
81.0000 mg | DELAYED_RELEASE_TABLET | Freq: Every day | ORAL | Status: DC
  Administered 2018-06-03 – 2018-06-12 (×10): 81 mg via ORAL
  Filled 2018-06-02 (×10): qty 1

## 2018-06-02 MED ORDER — METFORMIN HCL ER 500 MG PO TB24: 500.0000 mg | ORAL_TABLET | Freq: Every day | ORAL | 0 refills | Status: DC

## 2018-06-02 MED ORDER — ACETAMINOPHEN 325 MG PO TABS
650.0000 mg | ORAL_TABLET | Freq: Four times a day (QID) | ORAL | Status: DC | PRN
  Administered 2018-06-06 – 2018-06-12 (×5): 650 mg via ORAL
  Filled 2018-06-02 (×5): qty 2

## 2018-06-02 MED ORDER — ATORVASTATIN CALCIUM 40 MG PO TABS
40.0000 mg | ORAL_TABLET | Freq: Every day | ORAL | Status: DC
  Administered 2018-06-02 – 2018-06-11 (×10): 40 mg via ORAL
  Filled 2018-06-02 (×10): qty 1

## 2018-06-02 MED ORDER — ASPIRIN 81 MG PO TBEC: 81.0000 mg | DELAYED_RELEASE_TABLET | Freq: Every day | ORAL | Status: DC

## 2018-06-02 MED ORDER — CLOPIDOGREL BISULFATE 75 MG PO TABS: 75.0000 mg | ORAL_TABLET | Freq: Every day | ORAL | 0 refills | Status: DC

## 2018-06-02 MED ORDER — ONDANSETRON 4 MG PO TBDP
4.0000 mg | ORAL_TABLET | Freq: Four times a day (QID) | ORAL | Status: DC | PRN
  Filled 2018-06-02: qty 1

## 2018-06-02 MED ORDER — GABAPENTIN 100 MG PO CAPS
100.0000 mg | ORAL_CAPSULE | Freq: Three times a day (TID) | ORAL | Status: DC
  Administered 2018-06-02 – 2018-06-12 (×29): 100 mg via ORAL
  Filled 2018-06-02 (×29): qty 1

## 2018-06-02 MED ORDER — VENLAFAXINE HCL ER 75 MG PO CP24
75.0000 mg | ORAL_CAPSULE | Freq: Every day | ORAL | Status: DC
  Administered 2018-06-02 – 2018-06-11 (×10): 75 mg via ORAL
  Filled 2018-06-02 (×10): qty 1

## 2018-06-02 MED ORDER — SENNOSIDES-DOCUSATE SODIUM 8.6-50 MG PO TABS
1.0000 | ORAL_TABLET | Freq: Every evening | ORAL | Status: DC | PRN
  Administered 2018-06-11: 1 via ORAL
  Filled 2018-06-02: qty 1

## 2018-06-02 MED ORDER — SORBITOL 70 % SOLN
30.0000 mL | Freq: Every day | Status: DC | PRN
  Administered 2018-06-04 – 2018-06-11 (×3): 30 mL via ORAL
  Filled 2018-06-02 (×3): qty 30

## 2018-06-02 MED ORDER — PNEUMOCOCCAL VAC POLYVALENT 25 MCG/0.5ML IJ INJ: 0.5000 mL | INJECTION | INTRAMUSCULAR | Status: DC

## 2018-06-02 MED ORDER — LISINOPRIL 10 MG PO TABS
10.0000 mg | ORAL_TABLET | Freq: Every day | ORAL | Status: DC
  Administered 2018-06-03 – 2018-06-12 (×10): 10 mg via ORAL
  Filled 2018-06-02 (×10): qty 1

## 2018-06-02 NOTE — Progress Notes (Signed)
Patient ID: Carrie King, female   DOB: 1955-12-15, 63 y.o.   MRN: [id number]   LOS: 2 days   Subjective: Doing well, elbow sore but coming along. No wound issues.   Objective: Vital signs in last 24 hours: Temp:  [98 F (36.7 C)-98.7 F (37.1 C)] 98.1 F (36.7 C) (05/04 2330) Pulse Rate:  [85-101] 101 (05/05 0821) Resp:  [10-23] 11 (05/05 0821) BP: (172-183)/(91-108) 183/108 (05/05 0821) SpO2:  [97 %-99 %] 99 % (05/05 0821) Last BM Date: 05/31/18   Laboratory  CBC Recent Labs    05/30/18 1832  WBC 6.5  HGB 10.1*  HCT 32.6*  PLT 616*   BMET Recent Labs    05/30/18 1832 05/31/18 0456  NA 132* 137  K 4.1 4.4  CL 99 102  CO2 23 22  GLUCOSE 118* 163*  BUN 14 11  CREATININE 0.71 0.79  CALCIUM 9.1 9.5     Physical Exam General appearance: alert and no distress  UEx shoulder, elbow, wrist, digits- Incision C/D/I, no discharge on dressing, no erythema. AROM 95-150  Sens  Ax/R/M/U intact  Mot   Ax/ R/ PIN/ M/ AIN/ U intact  Rad 2+   Assessment/Plan: S/p left elbow ORIF -- Continue to work on motion. Will remove sutures. F/u with Dr. Jena Gauss once discharged from [facility].    [name], PA-C Orthopedic Surgery [phone] 06/02/2018
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
## 2018-06-02 NOTE — Progress Notes (Signed)
Patient arrived to unit via wheel chair by nurse with all her personal belongings. Patient oriented to unit and call light with-in reach  Lorri Frederick, LPN
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
## 2018-06-02 NOTE — Progress Notes (Signed)
Inpatient Rehabilitation Admissions Coordinator  I met with patient and with Attending service at bedside. Pt in agreement to admit today. I will make the arrangements. RN CM and SW made aware.  Danne Baxter, RN, MSN Rehab Admissions Coordinator [phone] 06/02/2018 10:44 AM

## 2018-06-02 NOTE — Progress Notes (Signed)
Pt transferred to 4W room 18. Report called.

## 2018-06-03 ENCOUNTER — Inpatient Hospital Stay (HOSPITAL_COMMUNITY): Payer: Self-pay | Admitting: Physical Therapy

## 2018-06-03 ENCOUNTER — Inpatient Hospital Stay (HOSPITAL_COMMUNITY): Payer: Self-pay

## 2018-06-03 ENCOUNTER — Inpatient Hospital Stay (HOSPITAL_COMMUNITY): Payer: Self-pay | Admitting: Occupational Therapy

## 2018-06-03 ENCOUNTER — Inpatient Hospital Stay (HOSPITAL_COMMUNITY): Payer: Medicaid Other | Admitting: Occupational Therapy

## 2018-06-03 DIAGNOSIS — R269 Unspecified abnormalities of gait and mobility: Secondary | ICD-10-CM

## 2018-06-03 DIAGNOSIS — I69398 Other sequelae of cerebral infarction: Principal | ICD-10-CM

## 2018-06-03 LAB — COMPREHENSIVE METABOLIC PANEL
ALT: 10 U/L (ref 0–44)
AST: 15 U/L (ref 15–41)
Albumin: 3.1 g/dL — ABNORMAL LOW (ref 3.5–5.0)
Alkaline Phosphatase: 231 U/L — ABNORMAL HIGH (ref 38–126)
Anion gap: 7 (ref 5–15)
BUN: 25 mg/dL — ABNORMAL HIGH (ref 8–23)
CO2: 24 mmol/L (ref 22–32)
Calcium: 9.5 mg/dL (ref 8.9–10.3)
Chloride: 106 mmol/L (ref 98–111)
Creatinine, Ser: 0.9 mg/dL (ref 0.44–1.00)
GFR calc Af Amer: >60 mL/min (ref >60)
GFR calc non Af Amer: >60 mL/min (ref >60)
Glucose, Bld: 146 mg/dL — ABNORMAL HIGH (ref 70–99)
Potassium: 4.2 mmol/L (ref 3.5–5.1)
Sodium: 137 mmol/L (ref 135–145)
Total Bilirubin: 0.5 mg/dL (ref 0.3–1.2)
Total Protein: 6.4 g/dL — ABNORMAL LOW (ref 6.5–8.1)

## 2018-06-03 LAB — CBC WITH DIFFERENTIAL/PLATELET
Abs Immature Granulocytes: 0.02 10*3/uL (ref 0.00–0.07)
Basophils Absolute: 0.1 10*3/uL (ref 0.0–0.1)
Basophils Relative: 1 %
Eosinophils Absolute: 0.1 10*3/uL (ref 0.0–0.5)
Eosinophils Relative: 2 %
HCT: 30.9 % — ABNORMAL LOW (ref 36.0–46.0)
Hemoglobin: 9.6 g/dL — ABNORMAL LOW (ref 12.0–15.0)
Immature Granulocytes: 0 %
Lymphocytes Relative: 31 %
Lymphs Abs: 1.6 10*3/uL (ref 0.7–4.0)
MCH: 30.4 pg (ref 26.0–34.0)
MCHC: 31.1 g/dL (ref 30.0–36.0)
MCV: 97.8 fL (ref 80.0–100.0)
Monocytes Absolute: 0.5 10*3/uL (ref 0.1–1.0)
Monocytes Relative: 9 %
Neutro Abs: 3 10*3/uL (ref 1.7–7.7)
Neutrophils Relative %: 57 %
Platelets: 517 10*3/uL — ABNORMAL HIGH (ref 150–400)
RBC: 3.16 MIL/uL — ABNORMAL LOW (ref 3.87–5.11)
RDW: 13.6 % (ref 11.5–15.5)
WBC: 5.2 10*3/uL (ref 4.0–10.5)
nRBC: 0 % (ref 0.0–0.2)

## 2018-06-03 LAB — GLUCOSE, CAPILLARY
Glucose-Capillary: 106 mg/dL — ABNORMAL HIGH (ref 70–99)
Glucose-Capillary: 170 mg/dL — ABNORMAL HIGH (ref 70–99)
Glucose-Capillary: 137 mg/dL — ABNORMAL HIGH (ref 70–99)
Glucose-Capillary: 128 mg/dL — ABNORMAL HIGH (ref 70–99)

## 2018-06-03 MED ORDER — SODIUM CHLORIDE 0.9% FLUSH
10.0000 mL | Freq: Two times a day (BID) | INTRAVENOUS | Status: DC
  Administered 2018-06-04 – 2018-06-08 (×9): 10 mL

## 2018-06-03 NOTE — Care Management Note (Signed)
Inpatient Rehabilitation Center Individual Statement of Services  Patient Name:  SHAWN EISENSTADT  Date:  06/03/2018  Welcome to the Inpatient Rehabilitation Center.  Our goal is to provide you with an individualized program based on your diagnosis and situation, designed to meet your specific needs.  With this comprehensive rehabilitation program, you will be expected to participate in at least 3 hours of rehabilitation therapies Monday-Friday, with modified therapy programming on the weekends.  Your rehabilitation program will include the following services:  Physical Therapy (PT), Occupational Therapy (OT), 24 hour per day rehabilitation nursing, Neuropsychology, Case Management (Social Worker), Rehabilitation Medicine, Nutrition Services and Pharmacy Services  Weekly team conferences will be held on Wednesday to discuss your progress.  Your Social Worker will talk with you frequently to get your input and to update you on team discussions.  Team conferences with you and your family in attendance may also be held.  Expected length of stay: 10-12 days  Overall anticipated outcome: supervision with ambulation and independent with ADL's  Depending on your progress and recovery, your program may change. Your Social Worker will coordinate services and will keep you informed of any changes. Your Social Worker's name and contact numbers are listed  below.  The following services may also be recommended but are not provided by the Inpatient Rehabilitation Center:   Driving Evaluations  Home Health Rehabiltiation Services  Outpatient Rehabilitation Services  Vocational Rehabilitation   Arrangements will be made to provide these services after discharge if needed.  Arrangements include referral to agencies that provide these services.  Your insurance has been verified to be:  Medicaid pending Your primary doctor is:  [employer] and [employer] pt  Pertinent information will be shared  with your doctor and your insurance company.  Social Worker:  Dossie Der, SW [phone] or ([phone]  Information discussed with and copy given to patient by: Lucy Chris, 06/03/2018, 9:41 AM

## 2018-06-03 NOTE — Progress Notes (Signed)
Occupational Therapy Session Note  Patient Details  Name: ROYCE PAWLOWICZ MRN: [id number] Date of Birth: 1955-07-13  Today's Date: 06/03/2018 OT Individual Time: 1230-1345 OT Individual Time Calculation (min): 75 min    Short Term Goals: Week 1:  OT Short Term Goal 1 (Week 1): Pt will be able to ambulate to toilet with LRAD with S. OT Short Term Goal 2 (Week 1): Pt will complete toileting with S. OT Short Term Goal 3 (Week 1): Pt will complete LB dressing with S. OT Short Term Goal 4 (Week 1): Pt will be able to use L arm to wash 50 % of R arm.  Skilled Therapeutic Interventions/Progress Updates:  Upon entering the room, pt seated in recliner chair awaiting OT arrival. OT placing heat on L UE while reviewing OT goals and POC. Session focused on AROM and PROM of elbow in all planes. Pt verbalized being uncomfortable at times but not painful. OT also educating and demonstrating self ROM pt to pt utilize R UE to assist with ranging as well. OT performed massage to incision site for scar tissue management as well. Pt remaining in recliner chair at end of session with call bell and all needed items within reach.  Therapy Documentation Precautions:  Precautions Precautions: Fall Required Braces or Orthoses: Other Brace Other Brace: L CAM walker boot Restrictions Weight Bearing Restrictions: Yes LUE Weight Bearing: Non weight bearing LLE Weight Bearing: Weight bearing as tolerated Other Position/Activity Restrictions: L ankle WBAT in CAM walker boot General:   Vital Signs: Therapy Vitals Temp: 98.5 F (36.9 C) Temp Source: Oral Pulse Rate: 93 Resp: 18 BP: (!) 142/78 Patient Position (if appropriate): Sitting Oxygen Therapy SpO2: 99 % O2 Device: Room Air Pain: Pain Assessment Pain Scale: 0-10 Pain Score: 3  Pain Type: Surgical pain Pain Location: Elbow Pain Orientation: Left Pain Descriptors / Indicators: Aching Pain Frequency: Constant Pain Onset: On-going Pain  Intervention(s): Medication (See eMAR)(tramadol) ADL: ADL Eating: Set up Grooming: Setup Upper Body Bathing: Minimal assistance Where Assessed-Upper Body Bathing: Sitting at sink Lower Body Bathing: Minimal assistance Where Assessed-Lower Body Bathing: Sitting at sink Upper Body Dressing: Minimal assistance Where Assessed-Upper Body Dressing: Sitting at sink Lower Body Dressing: Minimal assistance Where Assessed-Lower Body Dressing: Sitting at sink Toileting: Minimal assistance Where Assessed-Toileting: Teacher, adult education: Curator Method: Surveyor, minerals: Grab bars Vision Baseline Vision/History: No visual deficits;Wears glasses Wears Glasses: Distance only Patient Visual Report: No change from baseline Eye Alignment: Within Functional Limits Ocular Range of Motion: Within Functional Limits Alignment/Gaze Preference: Within Defined Limits Tracking/Visual Pursuits: Able to track stimulus in all quads without difficulty Saccades: Within functional limits Visual Fields: No apparent deficits Perception  Perception: Within Functional Limits Praxis Praxis: Intact Exercises:   Other Treatments:     Therapy/Group: Individual Therapy  Alen Bleacher 06/03/2018, 2:23 PM
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
## 2018-06-03 NOTE — Progress Notes (Signed)
Occupational Therapy Session Note  Patient Details  Name: Carrie King MRN: [id number] Date of Birth: September 19, 1955  Today's Date: 06/03/2018 OT Individual Time: 1500-1525 OT Individual Time Calculation (min): 25 min    Short Term Goals: Week 1:  OT Short Term Goal 1 (Week 1): Pt will be able to ambulate to toilet with LRAD with S. OT Short Term Goal 2 (Week 1): Pt will complete toileting with S. OT Short Term Goal 3 (Week 1): Pt will complete LB dressing with S. OT Short Term Goal 4 (Week 1): Pt will be able to use L arm to wash 50 % of R arm.  Skilled Therapeutic Interventions/Progress Updates:  1;1. Pt received in bed asleep but easily aroused. Pt completes stand pivot transfer with MIN A throughout session with VC for hand placement. Pt has TTB at home and practices ambulation with hemi walker with min A and VC for walker management. Pt reporting feeling lean increase as walk continues. Pt able ot manage LE over tub ledge seated on TTB. Pt completes stad pivot to w/c after d/t feeling "too woblby." [employer] rep drops off elbow brace and OT completes donning. Exited session with pt seated in recliner, exit alamr on and all needs met  Therapy Documentation Precautions:  Precautions Precautions: Fall Required Braces or Orthoses: Other Brace Other Brace: L CAM walker boot Restrictions Weight Bearing Restrictions: Yes LUE Weight Bearing: Non weight bearing LLE Weight Bearing: Weight bearing as tolerated Other Position/Activity Restrictions: L ankle WBAT in CAM walker boot General:   Vital Signs: Therapy Vitals Temp: 98.5 F (36.9 C) Temp Source: Oral Pulse Rate: 93 Resp: 18 BP: (!) 142/78 Patient Position (if appropriate): Sitting Oxygen Therapy SpO2: 99 % O2 Device: Room Air Pain: Pain Assessment Pain Scale: 0-10 Pain Score: 3  Pain Type: Surgical pain Pain Location: Elbow Pain Orientation: Left Pain Descriptors / Indicators: Aching Pain Frequency: Constant Pain  Onset: On-going Pain Intervention(s): Medication (See eMAR)(tramadol) ADL: ADL Eating: Set up Grooming: Setup Upper Body Bathing: Minimal assistance Where Assessed-Upper Body Bathing: Sitting at sink Lower Body Bathing: Minimal assistance Where Assessed-Lower Body Bathing: Sitting at sink Upper Body Dressing: Minimal assistance Where Assessed-Upper Body Dressing: Sitting at sink Lower Body Dressing: Minimal assistance Where Assessed-Lower Body Dressing: Sitting at sink Toileting: Minimal assistance Where Assessed-Toileting: Glass blower/designer: Psychiatric nurse Method: Arts development officer: Grab bars Vision Baseline Vision/History: No visual deficits;Wears glasses Wears Glasses: Distance only Patient Visual Report: No change from baseline Eye Alignment: Within Functional Limits Ocular Range of Motion: Within Functional Limits Alignment/Gaze Preference: Within Defined Limits Tracking/Visual Pursuits: Able to track stimulus in all quads without difficulty Saccades: Within functional limits Visual Fields: No apparent deficits Perception  Perception: Within Functional Limits Praxis Praxis: Intact Exercises:   Other Treatments:     Therapy/Group: Individual Therapy  [place] 06/03/2018, 3:29 PM
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
## 2018-06-03 NOTE — Progress Notes (Signed)
Inpatient Rehabilitation  Patient information reviewed and entered into eRehab system by Tekla Malachowski M. Margerie Fraiser, M.A., CCC/SLP, PPS Coordinator.  Information including medical coding, functional ability and quality indicators will be reviewed and updated through discharge.    

## 2018-06-03 NOTE — Progress Notes (Signed)
Orthopedic Tech Progress Note Patient Details:  Carrie King 06-10-1955 [id number] Called in order to [facility] Patient ID: Carrie King, female   DOB: 1955-07-29, 63 y.o.   MRN: [id number]   [name] 06/03/2018, 12:06 PM

## 2018-06-03 NOTE — Progress Notes (Signed)
Ortho Trauma Progress Note:  Wound stable. Range of motion limited. 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 Would encourage very aggressive ROM of elbow both active and passively. Continue NWB (no walker ambulation) however patient may use some light weight to improve motion and ADLs (no more than 5lbs). Okay for static progressive splinting to improve flexion and extension. Patient will also need aggressive ROM of supination and pronation. Discussed with patient goal over next week is to reach hand to mouth/head.  Please call with any questions.  Roby Lofts, MD [facility] [phone] (phone) [phone] (office) [email or web address]

## 2018-06-04 ENCOUNTER — Inpatient Hospital Stay (HOSPITAL_COMMUNITY): Payer: Self-pay | Admitting: Occupational Therapy

## 2018-06-04 ENCOUNTER — Inpatient Hospital Stay (HOSPITAL_COMMUNITY): Payer: Self-pay | Admitting: Physical Therapy

## 2018-06-04 LAB — GLUCOSE, CAPILLARY
Glucose-Capillary: 174 mg/dL — ABNORMAL HIGH (ref 70–99)
Glucose-Capillary: 146 mg/dL — ABNORMAL HIGH (ref 70–99)
Glucose-Capillary: 230 mg/dL — ABNORMAL HIGH (ref 70–99)
Glucose-Capillary: 145 mg/dL — ABNORMAL HIGH (ref 70–99)
Glucose-Capillary: 157 mg/dL — ABNORMAL HIGH (ref 70–99)
Glucose-Capillary: 190 mg/dL — ABNORMAL HIGH (ref 70–99)

## 2018-06-04 MED ORDER — GLIMEPIRIDE 2 MG PO TABS
1.0000 mg | ORAL_TABLET | Freq: Every day | ORAL | Status: DC
  Administered 2018-06-05 – 2018-06-12 (×8): 1 mg via ORAL
  Filled 2018-06-04 (×8): qty 1

## 2018-06-04 NOTE — Progress Notes (Signed)
Occupational Therapy Session Note  Patient Details  Name: Carrie King MRN: [id number] Date of Birth: 09/29/1955  Today's Date: 06/04/2018 OT Individual Time: 1330-1445 OT Individual Time Calculation (min): 75 min    Short Term Goals: Week 1:  OT Short Term Goal 1 (Week 1): Pt will be able to ambulate to toilet with LRAD with S. OT Short Term Goal 2 (Week 1): Pt will complete toileting with S. OT Short Term Goal 3 (Week 1): Pt will complete LB dressing with S. OT Short Term Goal 4 (Week 1): Pt will be able to use L arm to wash 50 % of R arm.  Skilled Therapeutic Interventions/Progress Updates:    Upon entering the room, pt seated on EOB awaiting OT arrival. Pt requesting to use bathroom and standing with hemi walker to ambulate 10' with min A for balance. Pt able to void and have BM with min guard for balance during hygiene. Pt requesting to change clothing while seated on toilet with min A to thread onto L UE secondary to wearing the bledsoe brace. Pt returning to sit on EOB and OT reviewing printed HEP for AROM of L UE for supination,pronation, ulnar and radial deviation, elbow flexion, and elbow extension with min verbal cuing. Program created on medbridge and OT showed pt how to add app onto phone as well. OT provided pt with total A to don brace in extension once again at end of session. Pt transferred into wheelchair with min A stand pivot transfer and seated at sink for grooming tasks. Call bell and all needed items within reach. Chair alarm belt donned for safety.   Therapy Documentation Precautions:  Precautions Precautions: Fall Required Braces or Orthoses: Other Brace Other Brace: L CAM walker boot Restrictions Weight Bearing Restrictions: Yes LUE Weight Bearing: Touch down weight bearing LLE Weight Bearing: Weight bearing as tolerated Other Position/Activity Restrictions: L ankle WBAT in CAM walker boot General:   Vital Signs: Therapy Vitals Temp: 98.6 F (37  C) Pulse Rate: (!) 103 Resp: 20 BP: (!) 156/85 Patient Position (if appropriate): Orthostatic Vitals Oxygen Therapy SpO2: 98 % O2 Device: Room Air Pain: Pain Assessment Pain Scale: 0-10 Pain Score: 4  Pain Type: Surgical pain Pain Location: Elbow Pain Orientation: Left Pain Descriptors / Indicators: Aching Pain Intervention(s): Ambulation/increased activity ADL: ADL Eating: Set up Grooming: Setup Upper Body Bathing: Minimal assistance Where Assessed-Upper Body Bathing: Sitting at sink Lower Body Bathing: Minimal assistance Where Assessed-Lower Body Bathing: Sitting at sink Upper Body Dressing: Minimal assistance Where Assessed-Upper Body Dressing: Sitting at sink Lower Body Dressing: Minimal assistance Where Assessed-Lower Body Dressing: Sitting at sink Toileting: Minimal assistance Where Assessed-Toileting: Teacher, adult education: Curator Method: Surveyor, minerals: Grab bars   Therapy/Group: Individual Therapy  Alen Bleacher 06/04/2018, 4:29 PM

## 2018-06-04 NOTE — Progress Notes (Signed)
Physical Therapy Session Note  Patient Details  Name: Carrie King MRN: [id number] Date of Birth: 1955/02/09  Today's Date: 06/04/2018 PT Individual Time: 1530-1630 PT Individual Time Calculation (min): 60 min   Short Term Goals: Week 1:  PT Short Term Goal 1 (Week 1): Pt will perform least restrictive transfers with Supervision PT Short Term Goal 2 (Week 1): Pt will ambulate x 150 ft with min A and LRAD Week 2:     Skilled Therapeutic Interventions/Progress Updates:   Pt received sitting in WC and agreeable to PT. WC mobility with Hemi technique using the RLE x 159f and supervision assist for safety. [facility]<>[facility] from PT with mild L lean. Pt reports mild dizziness in standing. Orthostatic BP assessed by PT: sitting 156/89. Standing 1 min 152/97 HR 107. Standing 3 min 173/111. Returned to sitting 152/96. HR 109. Pt allowed to rest xc 3 minutes and repeated 175/110 in standing x 2 minutes. CGA- close supervision assist from PT for safety in standing while performing BP assessment. Min cues for improved WB through the RLE to maintain midline.  RN notified of BP and states that per report, Pt can be permissible hypertensive. Spoke with MD, who states pt my be ~220/120 per Neurology recommendation.    Gait training with HW, 2 x 554fwith min-mod assist on first bout with L lateral lean and min assist on second bout. Improved weight shifting to the R with moderate cues from PT as well as improved placement of HW and step to gait pattern.   Pt returned to room and performed stand pivot transfer to bed with min assist and HW. Sit>supine completed with supervision assist and left supine in bed with call bell in reach and all needs met.        Therapy Documentation Precautions:  Precautions Precautions: Fall Required Braces or Orthoses: Other Brace Other Brace: L CAM walker boot Restrictions Weight Bearing Restrictions: Yes LUE Weight Bearing: Touch down weight  bearing LLE Weight Bearing: Weight bearing as tolerated Other Position/Activity Restrictions: L ankle WBAT in CAM walker boot    Vital Signs: Therapy Vitals Temp: 98.6 F (37 C) Pulse Rate: (!) 103 Resp: 20 BP: (!) 156/85 Patient Position (if appropriate): Orthostatic Vitals Oxygen Therapy SpO2: 98 % O2 Device: Room Air Pain: Pain Assessment Pain Scale: 0-10 Pain Score: 4  Pain Type: Surgical pain Pain Location: Elbow Pain Orientation: Left Pain Descriptors / Indicators: Aching Pain Frequency: Constant Pain Onset: On-going Patients Stated Pain Goal: 0 Pain Intervention(s): Medication (See eMAR)    Therapy/Group: Individual Therapy  AuLorie Phenix/07/2018, 5:18 PM

## 2018-06-04 NOTE — Progress Notes (Signed)
Orthopedic Tech Progress Note Patient Details:  Carrie King 02/22/1955 [id number]  Patient ID: Carrie King, female   DOB: 1955/09/05, 63 y.o.   MRN: [id number]   Saul Fordyce 06/04/2018, 4:38 PMCalled [facility] for left adjustable elbow orthosis.
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
## 2018-06-04 NOTE — Progress Notes (Signed)
Occupational Therapy Session Note  Patient Details  Name: Carrie King MRN: [id number] Date of Birth: 06-21-1955  Today's Date: 06/04/2018 OT Individual Time: 7846-9629 OT Individual Time Calculation (min): 70 min    Short Term Goals: Week 1:  OT Short Term Goal 1 (Week 1): Pt will be able to ambulate to toilet with LRAD with S. OT Short Term Goal 2 (Week 1): Pt will complete toileting with S. OT Short Term Goal 3 (Week 1): Pt will complete LB dressing with S. OT Short Term Goal 4 (Week 1): Pt will be able to use L arm to wash 50 % of R arm.  Skilled Therapeutic Interventions/Progress Updates:    Treatment session with focus on AROM and PROM of elbow in all planes.  Pt received upright in w/c reporting already washed and dressed prior to session. Engaged in discussion regarding Bledsoe bracing and wear schedule with pt and OT clinical specialist.  Discussed proper fit and bracing in alternating extension and flexion.  Engaged in [place] to facilitate gentle flexion and extension of elbow as well as supination/pronation.  OT performed massage to shoulder and upper pecs as pt reports pain in shoulder from overuse with movements.  Fit Bledsoe brace in flexion with plans to alternate to extension in 2 hours.  Pt reports tolerance to position in flexion.  Returned to room and left upright in w/c with seat belt alarm on and all needs in reach.  Therapy Documentation Precautions:  Precautions Precautions: Fall Required Braces or Orthoses: Other Brace Other Brace: L CAM walker boot Restrictions Weight Bearing Restrictions: Yes LUE Weight Bearing: Touch down weight bearing LLE Weight Bearing: Weight bearing as tolerated Other Position/Activity Restrictions: L ankle WBAT in CAM walker boot Pain: Pain Assessment Pain Score: 3    Therapy/Group: Individual Therapy  Carrie King 06/04/2018, 12:00 PM

## 2018-06-05 ENCOUNTER — Inpatient Hospital Stay (HOSPITAL_COMMUNITY): Payer: Self-pay | Admitting: Physical Therapy

## 2018-06-05 ENCOUNTER — Inpatient Hospital Stay (HOSPITAL_COMMUNITY): Payer: Self-pay

## 2018-06-05 ENCOUNTER — Inpatient Hospital Stay (HOSPITAL_COMMUNITY): Payer: Self-pay | Admitting: Occupational Therapy

## 2018-06-05 LAB — GLUCOSE, CAPILLARY
Glucose-Capillary: 176 mg/dL — ABNORMAL HIGH (ref 70–99)
Glucose-Capillary: 104 mg/dL — ABNORMAL HIGH (ref 70–99)
Glucose-Capillary: 120 mg/dL — ABNORMAL HIGH (ref 70–99)

## 2018-06-05 MED ORDER — ENOXAPARIN SODIUM 30 MG/0.3ML ~~LOC~~ SOLN
30.0000 mg | SUBCUTANEOUS | Status: DC
  Administered 2018-06-06: 30 mg via SUBCUTANEOUS
  Filled 2018-06-05 (×2): qty 0.3
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
## 2018-06-05 NOTE — Progress Notes (Addendum)
Occupational Therapy Session Note  Patient Details  Name: Carrie King MRN: [id number] Date of Birth: September 06, 1955  Today's Date: 06/05/2018 OT Individual Time: 5009-3818 OT Individual Time Calculation (min): 55 min   Short Term Goals: Week 1:  OT Short Term Goal 1 (Week 1): Pt will be able to ambulate to toilet with LRAD with S. OT Short Term Goal 2 (Week 1): Pt will complete toileting with S. OT Short Term Goal 3 (Week 1): Pt will complete LB dressing with S. OT Short Term Goal 4 (Week 1): Pt will be able to use L arm to wash 50 % of R arm.  Skilled Therapeutic Interventions/Progress Updates:    Pt greeted in bed and amenable to tx. Requesting to start session by using the restroom. OT donned B Teds and Lt CAM boot prior to mobility. Pt c/o soreness at anterior aspect of ankle. No supportive pad present between strap and sock, so OT added a folded wash cloth with reported increased comfort. She ambulated using hemi walker to toilet with Min A, Mod A for 1 lateral LOB while navigating bathroom threshold. Vcs provided for widening BOS to increase stability. Steady assist for toileting tasks, with pt having continent bladder void. She returned to w/c placed at sink to resume bathing/dressing tasks. Pt reported having bathed LEs last night, and did not want to do so again. Tenderness and bruising noted on Lt breast. Pt reported pain also. Notified MD and PA. Medical team okay'd use of ice pack to address pain. She needed active assist to wash Rt side with pt actively integrating L UE with min vcs. Educated pt on benefits of functional use for increasing ROM and decreasing edema. Due to fatigue, pt completed hair combing and oral care while seated vs standing. Gentle prolonged stretching completed to L UE in prep for donning bledsoe brace in extension. Educated pt to remove in 2 hours, and then follow up with next therapist for donning brace for 2 hours in flexion. She verbalized understanding. OT  also f/u with next therapist for carryover. Pt left at bedside in w/c. Encouraged elbow extension/ROM to tolerance before next tx. She was left with all needs within reach at session exit. Tx focus on L UE ROM, edema mgt, functional ambulation, activity tolerance, and dynamic balance.    Therapy Documentation Precautions:  Precautions Precautions: Fall Required Braces or Orthoses: Other Brace Other Brace: L CAM walker boot Restrictions Weight Bearing Restrictions: Yes LUE Weight Bearing: Non weight bearing LLE Weight Bearing: Weight bearing as tolerated Other Position/Activity Restrictions: L ankle WBAT in CAM walker boot Vital Signs: Therapy Vitals Pulse Rate: 92 BP: (!) 158/86 Patient Position (if appropriate): Sitting Pain: in L UE. RN in during session to provide pain medicine. Ice provided for tender site Lt chest at end of session.    ADL: ADL Eating: Set up Grooming: Setup Upper Body Bathing: Minimal assistance Where Assessed-Upper Body Bathing: Sitting at sink Lower Body Bathing: Minimal assistance Where Assessed-Lower Body Bathing: Sitting at sink Upper Body Dressing: Minimal assistance Where Assessed-Upper Body Dressing: Sitting at sink Lower Body Dressing: Minimal assistance Where Assessed-Lower Body Dressing: Sitting at sink Toileting: Minimal assistance Where Assessed-Toileting: Teacher, adult education: Curator Method: Surveyor, minerals: Grab bars      Therapy/Group: Individual Therapy  Chassidy Layson A Darianny Momon 06/05/2018, 12:08 PM

## 2018-06-05 NOTE — Progress Notes (Signed)
Physical Therapy Session Note  Patient Details  Name: Carrie King MRN: [id number] Date of Birth: [birth year]  Today's Date: 06/05/2018 PT Individual Time: 1120-1204 PT Individual Time Calculation (min): 44 min   Short Term Goals: Week 1:  PT Short Term Goal 1 (Week 1): Pt will perform least restrictive transfers with Supervision PT Short Term Goal 2 (Week 1): Pt will ambulate x 150 ft with min A and LRAD  Skilled Therapeutic Interventions/Progress Updates:  Pt received in w/c, reporting fatigue but agreeable to tx. Donned LUE brace with assistance from OT & educated pt on need to alternate every 2 hours between brace set in flexion/extension. Pt completes sit<>stand with CGA, adequately maintaining NWB LUE. Pt ambulates in room to door & back twice with RUE HHA & up to mod assist as pt with increasing L lateral lean and decreased weight shifting to R during gait. Discussed impaired midline orientation following medical event. Pt propelled w/c room<>BI gym with BLE & distant supervision. In BI gym pt stands at dynavision with min assist and task focusing on dynamic balance progressing to pt reaching to R side of board only to focus on weight shifting R. Back in room pt reports she has been feeling lightheaded (& currently feels lightheaded) since medical event & reports increased symptoms & dizziness with positional changes. Briefly educated pt on potential central vestibular issues following medical event & gaze stabilization exercises, and checked BP (158/86 mmHg, R wrist, HR = 92 bpm). At end of session pt left sitting in w/c with chair pad alarm donned & all needs at hand.  Therapy Documentation Precautions:  Precautions Precautions: Fall Required Braces or Orthoses: Other Brace Other Brace: L CAM walker boot Restrictions Weight Bearing Restrictions: Yes LUE Weight Bearing: Non weight bearing LLE Weight Bearing: Weight bearing as tolerated Other Position/Activity Restrictions: L  ankle WBAT in CAM walker boot    Pain: Pt c/o 5/10 pain in L elbow & LLE - pt reports she is already premedicated, rest breaks provided PRN.   Therapy/Group: Individual Therapy  Sandi Mariscal 06/05/2018, 12:22 PM

## 2018-06-06 ENCOUNTER — Inpatient Hospital Stay (HOSPITAL_COMMUNITY): Payer: Self-pay | Admitting: Occupational Therapy

## 2018-06-06 ENCOUNTER — Inpatient Hospital Stay (HOSPITAL_COMMUNITY): Payer: Self-pay | Admitting: Physical Therapy

## 2018-06-06 DIAGNOSIS — D62 Acute posthemorrhagic anemia: Secondary | ICD-10-CM

## 2018-06-06 DIAGNOSIS — R040 Epistaxis: Secondary | ICD-10-CM

## 2018-06-06 LAB — GLUCOSE, CAPILLARY
Glucose-Capillary: 115 mg/dL — ABNORMAL HIGH (ref 70–99)
Glucose-Capillary: 160 mg/dL — ABNORMAL HIGH (ref 70–99)
Glucose-Capillary: 142 mg/dL — ABNORMAL HIGH (ref 70–99)
Glucose-Capillary: 152 mg/dL — ABNORMAL HIGH (ref 70–99)
Glucose-Capillary: 154 mg/dL — ABNORMAL HIGH (ref 70–99)

## 2018-06-06 MED ORDER — SALINE SPRAY 0.65 % NA SOLN
2.0000 | NASAL | Status: DC | PRN
  Administered 2018-06-06: 15:00:00 2 via NASAL
  Filled 2018-06-06: qty 44

## 2018-06-06 NOTE — Progress Notes (Signed)
Physical Therapy Session Note  Patient Details  Name: Carrie King MRN: [id number] Date of Birth: 11-03-1955  Today's Date: 06/05/2018 PT Individual Time:1532-1630   58 min  Short Term Goals: Week 1:  PT Short Term Goal 1 (Week 1): Pt will perform least restrictive transfers with Supervision PT Short Term Goal 2 (Week 1): Pt will ambulate x 150 ft with min A and LRAD  Skilled Therapeutic Interventions/Progress Updates:   Pt received supine in bed and agreeable to PT. NT present for vitals assessment. Supine>sit transfer with supervision assist and min cues for NWB through the LUE. Stand pivot transfer to [facility] with  Min assist and HW. Transported to rehan gymin WC. Gait training with HW x 131f with min assist overall increased L lateral lean with increased distance. PT instructed pt dynamic standing balance while engaged in Wii bowling x 25 min with CGA-supevision assist. Once near LOB, but able to self correct without additional assist. Throughout treatment pt performed sit<>stand with CGA-supervision assist from PT. Nustep reciprocal movement training x 8 minutes with BLE and RLE. Cues for full ROM on level 7>8 throughout. RPE 5/10 upon completion. [facility] to [facility] and all needs met.         Therapy Documentation Precautions:  Precautions Precautions: Fall Required Braces or Orthoses: Other Brace Other Brace: L CAM walker boot Restrictions Weight Bearing Restrictions: Yes LUE Weight Bearing: Non weight bearing LLE Weight Bearing: Weight bearing as tolerated Other Position/Activity Restrictions: L ankle WBAT in CAM walker boot Vital Signs: Therapy Vitals Temp: 98.2 F (36.8 C) Temp Source: Oral Pulse Rate: 86 Resp: 15 BP: (!) 156/82 Oxygen Therapy SpO2: 99 % O2 Device: Room Air Pain: Pain Assessment Pain Scale: 0-10 Pain Score: 5  Pain Type: Acute pain Pain Location: Elbow Pain Orientation: Left Pain  Descriptors / Indicators: Aching Pain Onset: On-going Pain Intervention(s): Medication (See eMAR)   Therapy/Group: Individual Therapy  ALorie Phenix5/09/2018, 7:38 AM

## 2018-06-06 NOTE — Plan of Care (Signed)
  Problem: Consults Goal: RH STROKE PATIENT EDUCATION Description See Patient Education module for education specifics  Outcome: Progressing   Problem: RH SKIN INTEGRITY Goal: RH STG SKIN FREE OF INFECTION/BREAKDOWN Description No new breakdown or infection with mod I assist   Outcome: Progressing Goal: RH STG ABLE TO PERFORM INCISION/WOUND CARE W/ASSISTANCE Description STG Able To Perform Incision/Wound Care With mod  I Assistance.  Outcome: Progressing   Problem: RH PAIN MANAGEMENT Goal: RH STG PAIN MANAGED AT OR BELOW PT'S PAIN GOAL Description < 2 out of 10.   Outcome: Progressing   Problem: RH KNOWLEDGE DEFICIT Goal: RH STG INCREASE KNOWLEDGE OF DIABETES Description Pt will be able to verbalize proper techniques for monitoring blood glucose and administering prescribed medication with min assist/ cues at the time of discharge.   Outcome: Progressing

## 2018-06-06 NOTE — Progress Notes (Signed)
Occupational Therapy Session Note  Patient Details  Name: Carrie King MRN: [id number] Date of Birth: 1955/05/30  Today's Date: 06/06/2018 OT Individual Time: 1300-1431 OT Individual Time Calculation (min): 91 min    Short Term Goals: Week 1:  OT Short Term Goal 1 (Week 1): Pt will be able to ambulate to toilet with LRAD with S. OT Short Term Goal 2 (Week 1): Pt will complete toileting with S. OT Short Term Goal 3 (Week 1): Pt will complete LB dressing with S. OT Short Term Goal 4 (Week 1): Pt will be able to use L arm to wash 50 % of R arm.  Skilled Therapeutic Interventions/Progress Updates:    Pt greeted in w/c, requesting to complete bathing and wash her hair. Pt did so with close supervision for standing balance sit<stand at sink. Min A active assist to wash Rt side using L UE. Pt was mindful throughout tasks to incorporate L UE. At times she needed active assist from Rt LUE to involve limb, like when blowdrying hair. Ambulatory transfer<toilet completed with steady assist using hemi walker. Pt had continent bladder void and required steady assist for hygiene/clothing mgt. Once in w/c, pt tolerated prolonged elbow extension stretching in prep for bledsoe brace using MHP. After brace was donned, pt engaged in laundry folding task, involving reaching to bed using L UE, and folding towels/wash cloths on table placed at arms length away. She was left with all needs within reach, continuing to fold. Tx focus placed on dynamic balance, functional ambulation, and L UE ROM.    Therapy Documentation Precautions:  Precautions Precautions: Fall Required Braces or Orthoses: Other Brace Other Brace: L CAM walker boot Restrictions Weight Bearing Restrictions: Yes LUE Weight Bearing: Non weight bearing LLE Weight Bearing: Weight bearing as tolerated Other Position/Activity Restrictions: L ankle WBAT in CAM walker boot Pain: Pt reported L UE pain to be manageable with rest breaks during  stretching   ADL: ADL Eating: Set up Grooming: Setup Upper Body Bathing: Minimal assistance Where Assessed-Upper Body Bathing: Sitting at sink Lower Body Bathing: Minimal assistance Where Assessed-Lower Body Bathing: Sitting at sink Upper Body Dressing: Minimal assistance Where Assessed-Upper Body Dressing: Sitting at sink Lower Body Dressing: Minimal assistance Where Assessed-Lower Body Dressing: Sitting at sink Toileting: Minimal assistance Where Assessed-Toileting: Teacher, adult education: Curator Method: Surveyor, minerals: Grab bars     Therapy/Group: Individual Therapy  [name] 06/06/2018, 4:36 PM
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
## 2018-06-06 NOTE — Progress Notes (Signed)
Physical Therapy Session Note  Patient Details  Name: Carrie King MRN: [id number] Date of Birth: 1955/12/07  Today's Date: 06/06/2018 PT Individual Time: 0800-0900 1530-1615 PT Individual Time Calculation (min): 60 min and 45 min   Short Term Goals: Week 1:  PT Short Term Goal 1 (Week 1): Pt will perform least restrictive transfers with Supervision PT Short Term Goal 2 (Week 1): Pt will ambulate x 150 ft with min A and LRAD  Skilled Therapeutic Interventions/Progress Updates:  Session 1  Pt received sitting in WC and agreeable to PT. PT performed PROM and AAROM for shoulder extension/flexion within available range with sustained stretch at end range for x 20 sec each. 10 minutes. PT applied Bledsoe brace to elbow to -40 deg full extension.   WC mobility with BLE propulsion x 276f and supervision assist for safety.   PT instructed pt in dynamic balance training. Lateral reach R and L with the RUE to grab bean bags. Min assist from PT with visual feedback from mirror. Variable gait training with HW x 15 ft forward/backward with mod assist initially progressing to min assist. Improved R weight shift and step width following cues from PT .  Gait training with HW x 1034fwith min assist overall and moderate cues for step width and improved glute activaiton on the L to reduce COM displacement. Multiple short standing rest breaks to reorient to midline.  WC mobility back to room with supervision assist and BLE propulsion. [facility] reach and all needs met.    Session 2.  Pt received sitting in WC and agreeable to PT. PT instructed pt in halpike dix to rule out anterior canal BPPV. To only symptomatic when coming to sitting from supine or sidelying position.  PT instructed pt in VOR 1 and 2 gaze stabilization exercises 2 x 30 sec in sitting each only mild s/s in sitting. 2 x 30 sec in standing; pt reports moderate s/s of dizziness while in standing. Pt  noted to have significant difficulty maintaining gaze on target with cervical rotation to the L beyond 10 degrees. Additional VOR from midline to the L with significant s/s.Pt educated on habituation training and VOR effectiveness overtime. As well as importance of gaze stabilization with gait.  Gait training with HW x 5092fith min assist overall and cues for posture, decreased speed and stabilization of gaze in turns. Improved safety with decreased speed. Patient returned to room and [facility] and all needs met.            Therapy Documentation Precautions:  Precautions Precautions: Fall Required Braces or Orthoses: Other Brace Other Brace: L CAM walker boot Restrictions Weight Bearing Restrictions: Yes LUE Weight Bearing: Non weight bearing LLE Weight Bearing: Weight bearing as tolerated Other Position/Activity Restrictions: L ankle WBAT in CAM walker boot Vital Signs: Therapy Vitals Temp: 98.2 F (36.8 C) Temp Source: Oral Pulse Rate: 86 Resp: 15 BP: (!) 156/82 Oxygen Therapy SpO2: 99 % O2 Device: Room Air Pain: Pain Assessment Pain Scale: 0-10 Pain Score: 5  Pain Type: Acute pain Pain Location: Elbow Pain Orientation: Left Pain Descriptors / Indicators: Aching Pain Onset: On-going Pain Intervention(s): Medication (See eMAR)    Therapy/Group: Individual Therapy  AusLorie Phenix9/2020, 9:04 AM

## 2018-06-07 LAB — GLUCOSE, CAPILLARY
Glucose-Capillary: 141 mg/dL — ABNORMAL HIGH (ref 70–99)
Glucose-Capillary: 156 mg/dL — ABNORMAL HIGH (ref 70–99)
Glucose-Capillary: 98 mg/dL (ref 70–99)
Glucose-Capillary: 267 mg/dL — ABNORMAL HIGH (ref 70–99)
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
## 2018-06-08 ENCOUNTER — Inpatient Hospital Stay (HOSPITAL_COMMUNITY): Payer: Self-pay | Admitting: Physical Therapy

## 2018-06-08 ENCOUNTER — Inpatient Hospital Stay (HOSPITAL_COMMUNITY): Payer: Self-pay | Admitting: Occupational Therapy

## 2018-06-08 ENCOUNTER — Inpatient Hospital Stay (HOSPITAL_COMMUNITY): Payer: Medicaid Other

## 2018-06-08 LAB — GLUCOSE, CAPILLARY
Glucose-Capillary: 179 mg/dL — ABNORMAL HIGH (ref 70–99)
Glucose-Capillary: 114 mg/dL — ABNORMAL HIGH (ref 70–99)
Glucose-Capillary: 137 mg/dL — ABNORMAL HIGH (ref 70–99)
Glucose-Capillary: 140 mg/dL — ABNORMAL HIGH (ref 70–99)

## 2018-06-08 IMAGING — CR LEFT ANKLE COMPLETE - 3+ VIEW
3 series · 3 of 3 positions shown · non-contrast
Comparison: None.

CLINICAL DATA: 62-year-old female with left ankle pain

EXAM:
LEFT ANKLE COMPLETE - 3+ VIEW

[ankle ap]
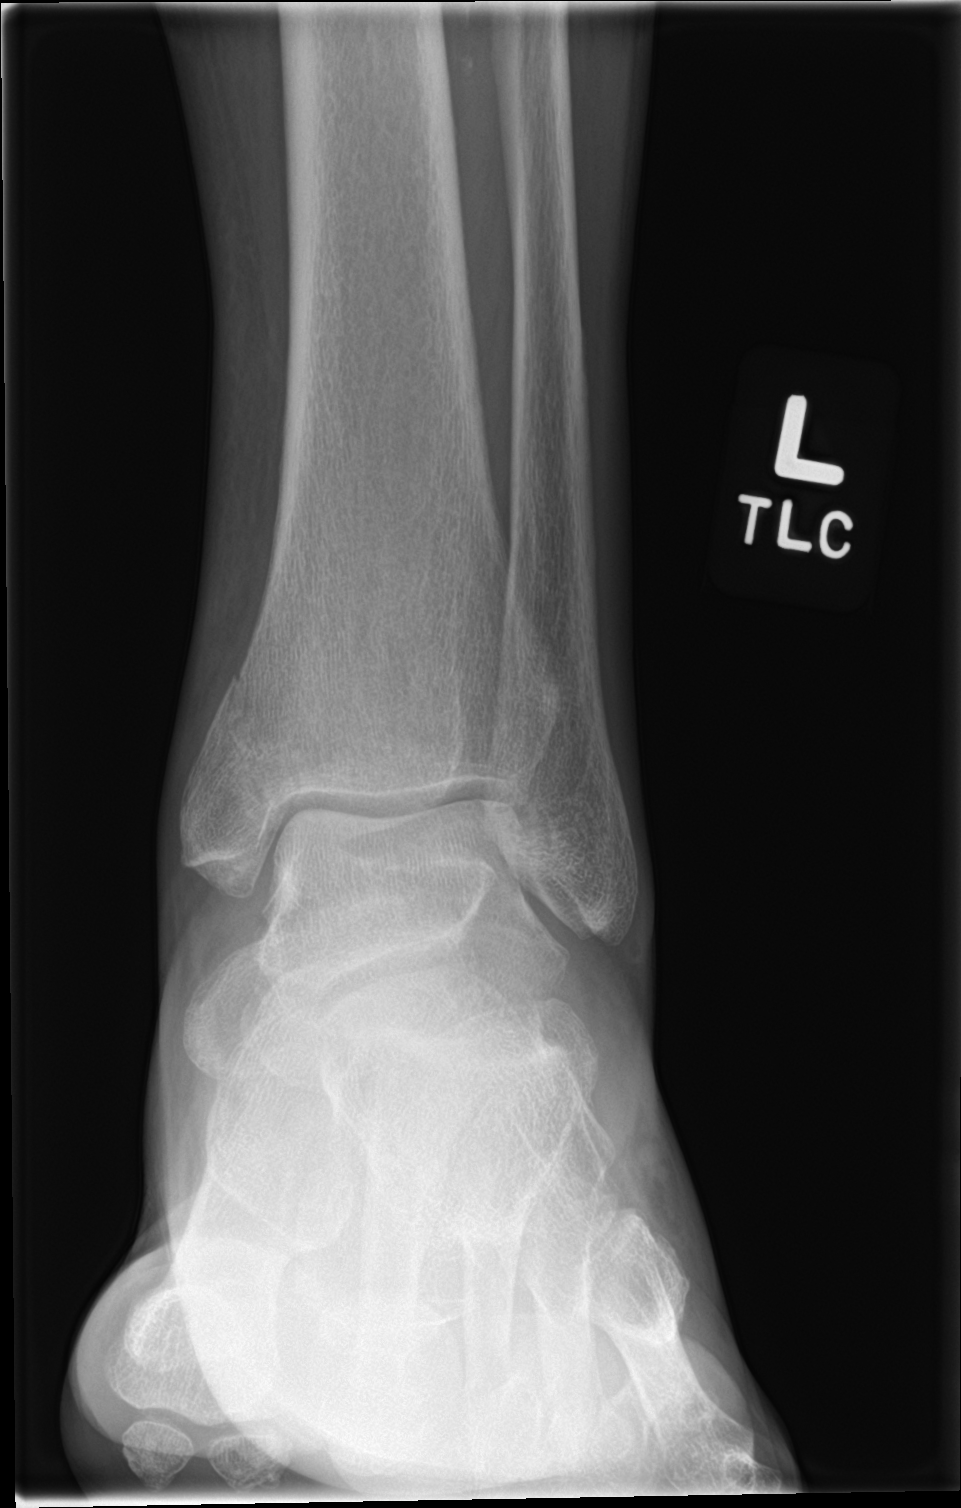

[ankle obl]
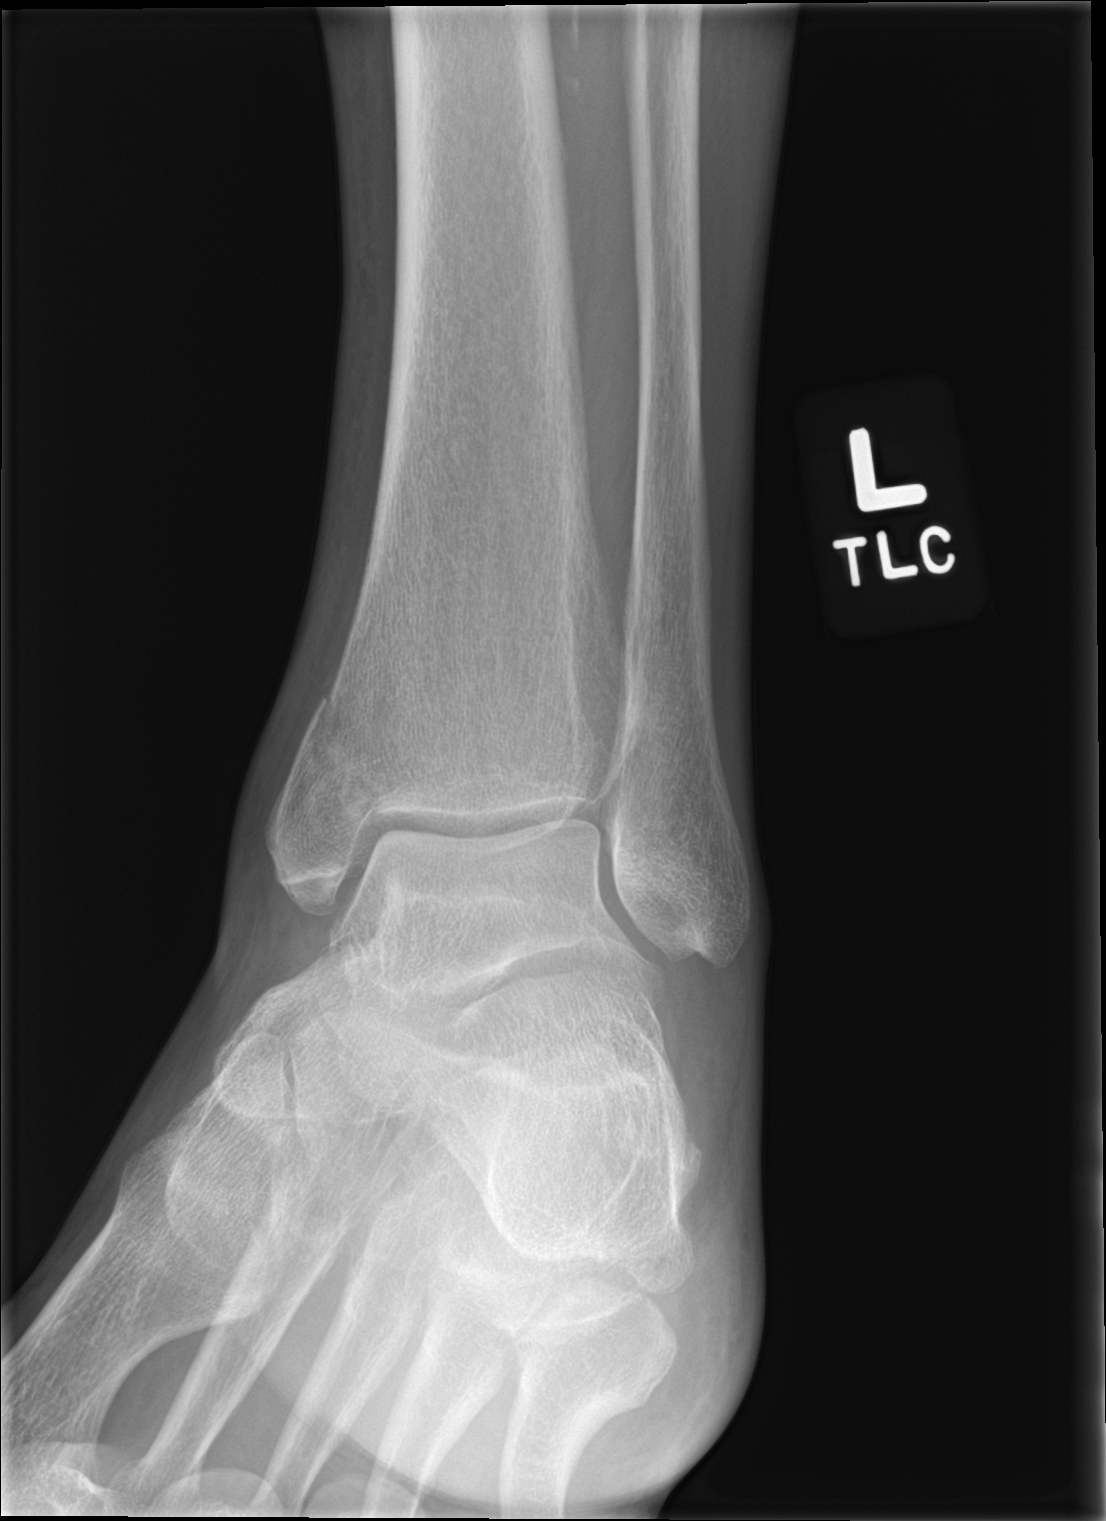

[ankle lat]
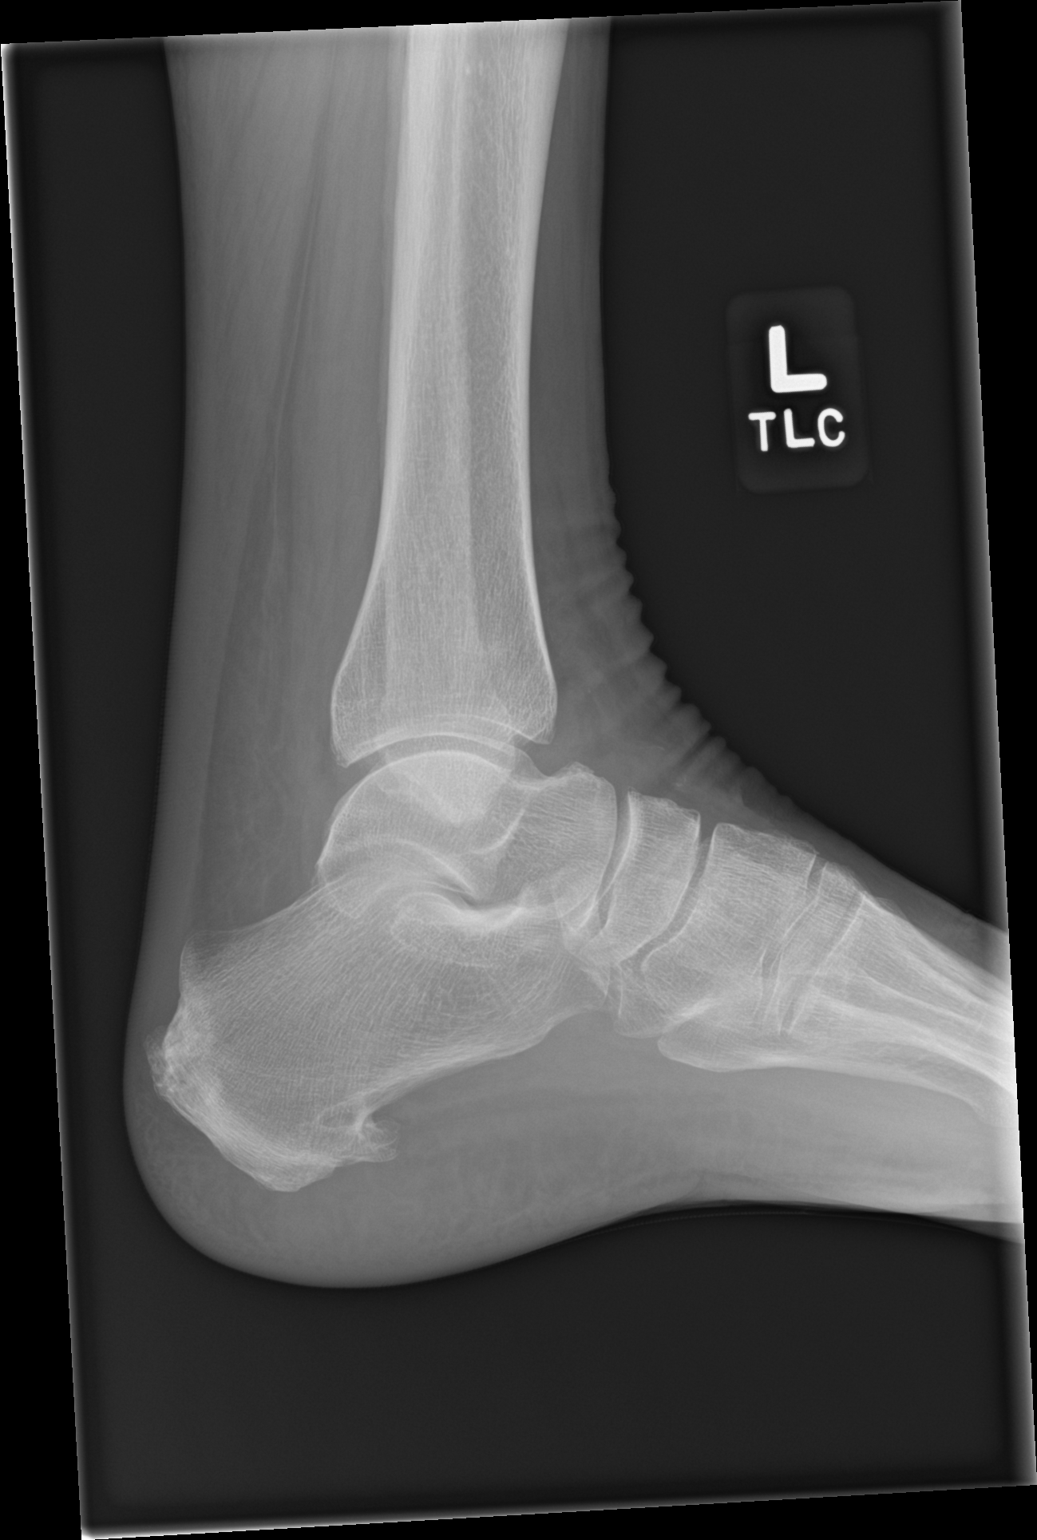

[3 of 3 positions shown; findings below may reference images not displayed]

FINDINGS: Fracture of the medial malleolus with no significant callus
formation. Fracture extends superiorly above the plafond. Ankle
mortise appears congruent. Soft tissue swelling.
IMPRESSION: Fracture of the medial malleolus, with the fracture line extending
above the plafond

## 2018-06-08 NOTE — Progress Notes (Addendum)
Ortho Trauma Progress Note:  Left elbow wound stable. Range of motion improving, about 70 degrees of elbow motion. Would continue to encourage very aggressive ROM of elbow both active and passively. Continue NWB on left upper extremity (no walker ambulation) however patient may use some light weight to improve motion and ADLs (no more than 5lbs). Okay for static progressive splinting to improve flexion and extension. Patient will also need aggressive ROM of supination and pronation. Discussed with patient goal over next week is to reach hand to mouth/head. Left ankle doing well, remains in CAM walker and is able to ambulate without significant discomfort. Will get repeat x-rays today to evaluate healing.  Will plan to see patient in the office after hospital discharge to discuss definitive fixation of lelt elbow fracture with removal of antibiotic spacer and placement of bone graft.   Please call with any questions.   Sarah A. Ladonna Sn[facility] ?[phone]? (phone)

## 2018-06-08 NOTE — Progress Notes (Addendum)
Occupational Therapy Session Note  Patient Details  Name: Carrie MajorVickie L Lover MRN: [id number] Date of Birth: 05-28-1955  Today's Date: 06/08/2018 OT Individual Time: 0800-0902 and 1333-1430 OT Individual Time Calculation (min): 62 min and 57 min Short Term Goals: Week 1:  OT Short Term Goal 1 (Week 1): Pt will be able to ambulate to toilet with LRAD with S. OT Short Term Goal 2 (Week 1): Pt will complete toileting with S. OT Short Term Goal 3 (Week 1): Pt will complete LB dressing with S. OT Short Term Goal 4 (Week 1): Pt will be able to use L arm to wash 50 % of R arm.     Skilled Therapeutic Interventions/Progress Updates:    Pt greeted in bed with RN present. She reported L UE pain to be manageable without medicinal interventions. Pt motivated to shower. She completed toileting (using standard toilet), bathing (seated on TTB in shower), dressing (sit<stand from toilet), and oral care/grooming tasks (seated at sink) during session. Tx focus placed on functional transfers, balance, and activity tolerance while adhering to WB precautions. CAM boot donned prior to all mobility, removed for shower, and donned prior to ambulating to toilet for dressing. All functional transfers completed with close supervision using hemi walker. Close supervision for dynamic standing balance during dressing tasks. She needed assist for donning B Teds and CAM boot. While seated at sink, she dried hair using blow-dryer and completed oral care with setup.Throughout session, pt mindful of involving L UE functionally as much as possible. At end of session pt was placed in L UE bledsoe brace (flexed position). Followed up with next therapist for carryover of brace schedule. Pt was left in w/c with all needs within reach at end of tx.   IV in shower got a little wet. Blow-dried dressing until dry. RN made aware.    2nd Session 1:1 tx (57 min) Pt greeted in w/c with no c/o pain. Requesting to do her laundry. Pt ambulated  with hemi walker and close supervision to laundry room. She stood at washer to load clothing using L UE. Afterwards, pt ambulated to large tub shower room with similar assist. Vcs required for forward gaze and slowing pace. We reviewed technique for TTB transfers, per setup at home. She also reported she wanted to get dressed while sitting on an armless stool near sink at home post bathing. Bathroom environment was setup to emulate home, and pt completed transfer in>out of tub shower using hemi walker with steady assist, and transfer to shower chair near sink with close supervision. Discussed purchase of nonslip treads for shower, utilizing lateral leans for bathing, and to have CAM boot on when transferring in/out of shower. She verbalized understanding. Pt then ambulated partway back to room. She self propelled w/c using LEs for remainder of way. Pt was placed in bledsoe brace and left with all needs within reach while sitting in w/c. She verbalized she would remove brace in 2 hours per wearing schedule. In total, she ambulated ~430 ft during session. A few LOBs to the Lt with pt requiring steady-Min A to correct. Tx focus placed on d/c planning, functional ambulation with device, IADL retraining, and dynamic balance.    Therapy Documentation Precautions:  Precautions Precautions: Fall Required Braces or Orthoses: Other Brace Other Brace: L CAM walker boot Restrictions Weight Bearing Restrictions: Yes LUE Weight Bearing: Non weight bearing LLE Weight Bearing: Weight bearing as tolerated Other Position/Activity Restrictions: L ankle WBAT in CAM walker boot ADL: ADL Eating: Set  up Grooming: Setup Upper Body Bathing: Minimal assistance Where Assessed-Upper Body Bathing: Sitting at sink Lower Body Bathing: Minimal assistance Where Assessed-Lower Body Bathing: Sitting at sink Upper Body Dressing: Minimal assistance Where Assessed-Upper Body Dressing: Sitting at sink Lower Body Dressing: Minimal  assistance Where Assessed-Lower Body Dressing: Sitting at sink Toileting: Minimal assistance Where Assessed-Toileting: Teacher, adult education: Curator Method: Surveyor, minerals: Grab bars    Therapy/Group: Individual Therapy  Maricela Schreur A Farhaan Mabee 06/08/2018, 4:07 PM

## 2018-06-08 NOTE — Progress Notes (Signed)
Physical Therapy Session Note  Patient Details  Name: Carrie King MRN: [id number] Date of Birth: 22-Nov-1955  Today's Date: 06/08/2018 PT Individual Time: 1020-1130 PT Individual Time Calculation (min): 70 min   Short Term Goals: Week 1:  PT Short Term Goal 1 (Week 1): Pt will perform least restrictive transfers with Supervision PT Short Term Goal 2 (Week 1): Pt will ambulate x 150 ft with min A and LRAD  Skilled Therapeutic Interventions/Progress Updates: Pt presented in w/c agreeable to therapy. PTA changed brace to extension with PTA performing stretching at end range, brace locked in 4 degrees extension. Pt transported to rehab gym for energy conservation and participated in balance activities including alternating toe taps to 4in step. Pt also participated in standing/reaching activities with RUE with contralateral and ipsilateral LE on 2in step. Pt able to perform placing clothespins on bar and on basketball net without AD and without LOB. Pt then participated in gait training ambulating with HW x 118f with CGA and no L lean noted. Pt then participated in obstacle course weaving through cones and stepping over threshold with HW. Pt able to perform all activities with CGA no LOB and safe use of HW. [facility] for [facility] with x 1 occurrence of increased L lean which pt was able to correct. Pt returned to bed at end of session with PTA applying hot pack to L shoulder blade for comfort. Pt left with bed alarm on, call bell within reach and needs met.      Therapy Documentation Precautions:  Precautions Precautions: Fall Required Braces or Orthoses: Other Brace Other Brace: L CAM walker boot Restrictions Weight Bearing Restrictions: Yes LUE Weight Bearing: Non weight bearing LLE Weight Bearing: Weight bearing as tolerated Other Position/Activity Restrictions: L ankle WBAT in CAM walker boot General:   Vital Signs: Therapy Vitals Temp: 99.2  F (37.3 C) Temp Source: Oral Pulse Rate: (!) 107 Resp: 20 BP: (!) 155/66 Patient Position (if appropriate): Sitting Oxygen Therapy SpO2: 96 % O2 Device: Room Air Pain: Pain Assessment Pain Scale: 0-10 Pain Score: 4  Pain Type: Surgical pain Pain Location: Arm Pain Orientation: Left Pain Intervention(s): Medication (See eMAR)    Therapy/Group: Individual Therapy  Miabella Shannahan  Marshea Wisher, PTA  06/08/2018, 4:58 PM
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
## 2018-06-09 ENCOUNTER — Inpatient Hospital Stay (HOSPITAL_COMMUNITY): Payer: Self-pay | Admitting: Occupational Therapy

## 2018-06-09 ENCOUNTER — Inpatient Hospital Stay (HOSPITAL_COMMUNITY): Payer: Self-pay | Admitting: Physical Therapy

## 2018-06-09 ENCOUNTER — Inpatient Hospital Stay (HOSPITAL_COMMUNITY): Payer: Self-pay | Admitting: *Deleted

## 2018-06-09 LAB — GLUCOSE, CAPILLARY
Glucose-Capillary: 181 mg/dL — ABNORMAL HIGH (ref 70–99)
Glucose-Capillary: 138 mg/dL — ABNORMAL HIGH (ref 70–99)
Glucose-Capillary: 178 mg/dL — ABNORMAL HIGH (ref 70–99)
Glucose-Capillary: 167 mg/dL — ABNORMAL HIGH (ref 70–99)
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
## 2018-06-09 NOTE — Progress Notes (Signed)
Occupational Therapy Session Note  Patient Details  Name: Carrie King MRN: [id number] Date of Birth: December 31, 1955  Today's Date: 06/09/2018 OT Individual Time: 6122-4497 and 5300-5110 OT Individual Time Calculation (min): 43 min and 45 min   Short Term Goals: Week 1:  OT Short Term Goal 1 (Week 1): Pt will be able to ambulate to toilet with LRAD with S. OT Short Term Goal 2 (Week 1): Pt will complete toileting with S. OT Short Term Goal 3 (Week 1): Pt will complete LB dressing with S. OT Short Term Goal 4 (Week 1): Pt will be able to use L arm to wash 50 % of R arm.  Skilled Therapeutic Interventions/Progress Updates:    Session 1: Upon entering the room, pt seated in wheelchair awaiting OT arrival. OT providing PROM to L UE focusing on elbow in all planes of movement. Pt verbalized some pain at end points and reported pain in L shoulder with heat applied to area. OT placing bledsoe brace in 110 degrees flexion. Pt with several questions regarding upcoming discharge and conference. Pt remained seated in wheelchair with call bell and all needed items within reach.   Session 2: Upon entering the room, pt seated in wheelchair and agreeable to OT intervention. OT and pt calling her daughter, Carrie King, to schedule family education for Thursday in preparation of upcoming discharge. OT answering several questions regarding OT intervention and discharge plan. OT removed bledsoe brace and pt demonstrated HEP for self ROM with min verbal cuing for proper technique. Pt standing with close supervision and use of hemi walker and utilized pendulum exercises on L UE to decrease shoulder pain this session. OT placed brace back onto L UE in 110 degrees of flexion. Pt requesting need for toileting and ambulating with min guard and use of hemi walker into bathroom. Pt performing clothing management with supervision and remains on toilet at end of session. Pt to call staff when done. Call bell within reach.    Therapy Documentation Precautions:  Precautions Precautions: Fall Required Braces or Orthoses: Other Brace Other Brace: L CAM walker boot Restrictions Weight Bearing Restrictions: Yes LUE Weight Bearing: Non weight bearing(may lift light weight objects; cell, cup etc) LLE Weight Bearing: Weight bearing as tolerated Other Position/Activity Restrictions: L ankle WBAT in CAM walker boot General:   Vital Signs: Therapy Vitals Temp: 99.1 F (37.3 C) Temp Source: Oral Pulse Rate: 96 Resp: 20 BP: (!) 166/91 Patient Position (if appropriate): Sitting Oxygen Therapy SpO2: 99 % O2 Device: Room Air Pain:   ADL: ADL Eating: Set up Grooming: Setup Upper Body Bathing: Minimal assistance Where Assessed-Upper Body Bathing: Sitting at sink Lower Body Bathing: Minimal assistance Where Assessed-Lower Body Bathing: Sitting at sink Upper Body Dressing: Minimal assistance Where Assessed-Upper Body Dressing: Sitting at sink Lower Body Dressing: Minimal assistance Where Assessed-Lower Body Dressing: Sitting at sink Toileting: Minimal assistance Where Assessed-Toileting: Teacher, adult education: Curator Method: Surveyor, minerals: Grab bars   Therapy/Group: Individual Therapy  Alen Bleacher 06/09/2018, 4:51 PM
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
## 2018-06-09 NOTE — Progress Notes (Signed)
Occupational Therapy Session Note  Patient Details  Name: Carrie King MRN: [id number] Date of Birth: February 18, 1955  Today's Date: 06/09/2018 OT Individual Time: 9563-8756 OT Individual Time Calculation (min): 45 min    Short Term Goals: Week 1:  OT Short Term Goal 1 (Week 1): Pt will be able to ambulate to toilet with LRAD with S. OT Short Term Goal 2 (Week 1): Pt will complete toileting with S. OT Short Term Goal 3 (Week 1): Pt will complete LB dressing with S. OT Short Term Goal 4 (Week 1): Pt will be able to use L arm to wash 50 % of R arm.  Skilled Therapeutic Interventions/Progress Updates:    Patient seated in w/c and ready for therapy session.  Completed AAROM of left elbow, stretching into flexion and extension, light massage to posterior aspect of elbow and repositioned brace into extension.  Reviewed positioning options, scapular mobility, posture and effects of guarding positions - good carryover and relief noted at shoulder with back support, change of position and left UE positioning.  Patient completed shoulder ROM, stretching, codman ex and reaching activities.  Provided theraputty - reviewed with good carryover.  Patient ambulated in room with hemi cane CS.  Patient remained in w/c at close of session - able to reach all items   Therapy Documentation Precautions:  Precautions Precautions: Fall Required Braces or Orthoses: Other Brace Other Brace: L CAM walker boot Restrictions Weight Bearing Restrictions: Yes LUE Weight Bearing: Non weight bearing(may lift light weight objects; cell, cup etc) LLE Weight Bearing: Weight bearing as tolerated Other Position/Activity Restrictions: L ankle WBAT in CAM walker boot General:   Vital Signs:  Pain: Pain Assessment Pain Scale: 0-10 Pain Score: 2    Other Treatments:     Therapy/Group: Individual Therapy  Barrie Lyme 06/09/2018, 12:19 PM

## 2018-06-09 NOTE — Progress Notes (Addendum)
Physical Therapy Session Note  Patient Details  Name: Carrie King MRN: [id number] Date of Birth: 17-Dec-1955  Today's Date: 06/09/2018 PT Individual Time: 0100-7121 PT Individual Time Calculation (min): 56 min   Short Term Goals: Week 1:  PT Short Term Goal 1 (Week 1): Pt will perform least restrictive transfers with Supervision PT Short Term Goal 2 (Week 1): Pt will ambulate x 150 ft with min A and LRAD  Skilled Therapeutic Interventions/Progress Updates:      Therapy Documentation Precautions:  Precautions Precautions: Fall Required Braces or Orthoses: Other Brace Other Brace: L CAM walker boot Restrictions Weight Bearing Restrictions: Yes LUE Weight Bearing: Non weight bearing(may lift light weight objects; cell, cup etc) LLE Weight Bearing: Weight bearing as tolerated Other Position/Activity Restrictions: L ankle WBAT in CAM walker boot  Treatment:  Pt received in wheelchair on arrival to room. Agreed to PT at this time. Reports some left shoulder pain, has moist hot pack on it from OT session prior to this session. Has been premedicated.  Pt propelled herself to large rehab gym from her room using bil LE's to steer/propell wheelchair. Supervision to transfer wheelchair to mat table with hemi walker.   Neuro re-ed: worked on static standing balance and vestibular system reactions. In standing without UE support had worked on single leg stance on floor: alternating fwd foot taps from floor<>airex with min guard to min assist for balance. Progressed to staggered stance one foot on floor, other fwd on airex: with eyes open, progressing to eyes closed with min guard to min assist. Progressed to eyes open head movements left<>right, then up<>down. With bil feet on airex feet apart: once pt gained balance with eyes open, progressed to eyes closed, then with eyes open head movements left<>right, then up<>down, progressing to eyes closed with head movements left<>right,  up<>down,all with min guard to min assist for balance with intermittent touch to chair back to no UE support. No dizziness or other symptoms reported with these exercises. Red wedge bolster on floor: standing up the incline with feet apart. Min assist initially due to posterior lean, progressed to min guard assist. Once pt had re-oriented herself for balance had her close her eyes with up to min assist needed, no UE support with this activity.   Gait: with hemi walker around cones working on narrow spaces and turns. Cues needed for walk position for safety, sequencing and weight shifting. Once episode of inc left lateral weight shifting needing min assist to correct, otherwise pt was min guard assist. Mild dizziness reported after 4th lap with improvement after seated rest with gaze stabilize.  Fwd stepping over obstacles of varied heights with cues on posture, correct sequencing and weight shifting. Min guard to min assist at times with higher obstacles. Up/down ramp with hemi walker with gait over rubber mulch between. Min guard to min assist over the mulch with cues on sequencing of walker. Min guard on ramp with cues for posture, sequencing and step length. 150 feet with hemi walker with min guard assist. Continued cues on posture, walker placement/position and sequencing. Decreased step length bil LE's with step to gait pattern. Pt continues to have tendency to over shift on left, able to self correct this with gait today.   Pt left in wheelchair in room with all needs in reach.      Therapy/Group: Individual Therapy  Livingston Diones, [place], CLT 06/09/18, 4:01 PM 06/09/2018, 3:26 PM

## 2018-06-10 ENCOUNTER — Inpatient Hospital Stay (HOSPITAL_COMMUNITY): Payer: Self-pay | Admitting: Occupational Therapy

## 2018-06-10 ENCOUNTER — Inpatient Hospital Stay (HOSPITAL_COMMUNITY): Payer: Self-pay | Admitting: Physical Therapy

## 2018-06-10 LAB — GLUCOSE, CAPILLARY
Glucose-Capillary: 153 mg/dL — ABNORMAL HIGH (ref 70–99)
Glucose-Capillary: 204 mg/dL — ABNORMAL HIGH (ref 70–99)
Glucose-Capillary: 162 mg/dL — ABNORMAL HIGH (ref 70–99)
Glucose-Capillary: 130 mg/dL — ABNORMAL HIGH (ref 70–99)
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
## 2018-06-10 NOTE — Patient Care Conference (Addendum)
Inpatient RehabilitationTeam Conference and Plan of Care Update Date: 06/10/2018   Time: 10:36 AM    Patient Name: Carrie King      Medical Record Number: [id number]  Date of Birth: Mar 16, 1955 Sex: Female         Room/Bed: 4W18C/4W18C-01 Payor Info: Payor: MEDICAID POTENTIAL / Plan: MEDICAID POTENTIAL / Product Type: *No Product type* /    Admitting Diagnosis: B CVA; 0103A; 13-15days  Admit Date/Time:  06/02/2018  2:35 PM Admission Comments: No comment available   Primary Diagnosis:  <principal problem not specified> Principal Problem: <principal problem not specified>  Patient Active Problem List   Diagnosis Date Noted  . 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 Acute CVA (cerebrovascular accident) (HCC) 05/31/2018  . Hypertension 05/31/2018  . Rupture of left triceps tendon 05/20/2018  . Medial malleolar fracture 05/20/2018  . Diabetes mellitus (HCC) 05/16/2018  . Open bicondylar fracture of distal humerus, left, initial encounter 05/14/2018  . Left elbow fracture 05/14/2018  . Open Monteggia's fracture of left ulna, type IIIA, IIIB, or IIIC 05/14/2018    Expected Discharge Date: Expected Discharge Date: 06/12/18  Team Members Present: Physician leading conference: Dr. Claudette Laws Social Worker Present: Dossie Der, LCSW Nurse Present: Willey Blade, RN PT Present: Grier Rocher, PT;Rosita Dechalus, PTA OT Present: Jackquline Denmark, OT SLP Present: Reuel Derby, SLP PPS Coordinator present : Fae Pippin     Current Status/Progress Goal Weekly Team Focus  Medical   balance d/o due to Left lat medullary infarct, Left Ulnar humeral fx , L ankle fx         Bowel/Bladder   continent of bowel and bladder, LBM 06/09/18  remain continent  assist as needed   Swallow/Nutrition/ Hydration             ADL's   Supervision bathing while seated in shower, Supervision UB dressing, Min A LB dressing (Teds+CAM boot), close supervision-steady assist toileting + ambulatory  bathroom transfers using hemi walker  Supervision-Mod I overall   D/c planning, L UE AROM, pt/family education, dynamic balance    Mobility   supervision transfers with HW, CGA gait with HW 187ft including compliant surfaces and ramps  mod I transfers, S gait with LRAD  d/c planning, balance, gait    Communication             Safety/Cognition/ Behavioral Observations            Pain   prn tramadol and oxycodone  pain score less than 4  assess and medicate as needed   Skin   incision clean dry and intact  no signs of infection or future breakdown  assess every shift      *See Care Plan and progress notes for long and short-term goals.     Barriers to Discharge  Current Status/Progress Possible Resolutions Date Resolved   Physician                    Nursing  Weight bearing restrictions               PT                    OT                  SLP                SW                Discharge Planning/Teaching Needs:  HOme with husband and daughter will assist wiht brace. Pt doing well and will be ready this week for DC      Team Discussion:  Doing well in therapies and making good progress. Goals mod/i-supervision level and daughter coming in tomorrow for education-daughter. Awaiting surgery on arm will follow up with surgeon as an OP. Pian being managed by MD and nursing. Can donn cam boot independently.  Revisions to Treatment Plan:  DC 5/15        I attest that I was present, lead the team conference, and concur with the assessment and plan of the team. Teleconference held due to COVID-19   Lucy ChrisDupree, Mari Battaglia G 06/10/2018, 10:36 AM

## 2018-06-10 NOTE — Progress Notes (Signed)
Occupational Therapy Session Note  Patient Details  Name: Carrie King MRN: [id number] Date of Birth: 02-Feb-1955  Today's Date: 06/10/2018 OT Individual Time: 9381-8299 OT Individual Time Calculation (min): 44 min    Short Term Goals: Week 1:  OT Short Term Goal 1 (Week 1): Pt will be able to ambulate to toilet with LRAD with S. OT Short Term Goal 2 (Week 1): Pt will complete toileting with S. OT Short Term Goal 3 (Week 1): Pt will complete LB dressing with S. OT Short Term Goal 4 (Week 1): Pt will be able to use L arm to wash 50 % of R arm.   Skilled Therapeutic Interventions/Progress Updates:    Upon entering the room, pt supine in bed and requesting to "wash up" in bathroom. Pt performed supine >sit with supervision and donned L LE CAM boot without assistance. Pt gathered all items and placed under L UE while ambulating with hemi walker to bathroom with close supervision. Pt having BM and able to void while seated on commode. Pt washing self while seated on commode with overall close supervision. Pt exited the room and stood at sink for grooming tasks at overall supervision as well. Pt sitting in wheelchair and L UE bledsoe braced donned in extension. Pt tolerating well and remained in wheelchair at end of session. Call bell and all needed items within reach.   Therapy Documentation Precautions:  Precautions Precautions: Fall Required Braces or Orthoses: Other Brace Other Brace: L CAM walker boot Restrictions Weight Bearing Restrictions: Yes LUE Weight Bearing: Non weight bearing(may lift light weight objects; cell, cup etc) LLE Weight Bearing: Weight bearing as tolerated Other Position/Activity Restrictions: L ankle WBAT in CAM walker boot Pain: Pain Assessment Pain Scale: 0-10 Pain Score: 2  Pain Type: Chronic pain Pain Location: Rib cage Pain Orientation: Left Pain Descriptors / Indicators: Aching Pain Intervention(s): Medication (See eMAR) ADL: ADL Eating: Set  up Grooming: Setup Upper Body Bathing: Minimal assistance Where Assessed-Upper Body Bathing: Sitting at sink Lower Body Bathing: Minimal assistance Where Assessed-Lower Body Bathing: Sitting at sink Upper Body Dressing: Minimal assistance Where Assessed-Upper Body Dressing: Sitting at sink Lower Body Dressing: Minimal assistance Where Assessed-Lower Body Dressing: Sitting at sink Toileting: Minimal assistance Where Assessed-Toileting: Teacher, adult education: Curator Method: Surveyor, minerals: Grab bars   Therapy/Group: Individual Therapy  Alen Bleacher 06/10/2018, 12:19 PM

## 2018-06-10 NOTE — Progress Notes (Signed)
Social Work Patient ID: Carrie King, female   DOB: 01/04/1956, 64 y.o.   MRN: [id number] Met with pt to discuss equipment she reports only ned is the hemi-walker. She has tub seat at home. Discussed the billing she wants to be billed for this. Daughter coming in tomorrow for training and planning on discharge Friday.

## 2018-06-10 NOTE — Progress Notes (Signed)
Physical Therapy Session Note  Patient Details  Name: Carrie King MRN: [id number] Date of Birth: [birth year]  Today's Date: 06/10/2018 PT Individual Time: 0915-1025 AND 1530-1630 PT Individual Time Calculation (min): 70 min and 60 min   Short Term Goals: Week 1:  PT Short Term Goal 1 (Week 1): Pt will perform least restrictive transfers with Supervision PT Short Term Goal 2 (Week 1): Pt will ambulate x 150 ft with min A and LRAD  Skilled Therapeutic Interventions/Progress Updates:  Session 1 : Pt received sitting in WC and agreeable to PT. WC mobilty with BLE propulsion x 265f without cues or assist from PT.   Gait training with HW over level surface x 461fwith supervision assist. Dynamic gait training with HW and close supervision assist to weave through 6 cones + 6 cones and overl unlevel foam mat.     Biodex dynamic balance training. R/L and A/P weight shifts 2 x 1 min each with RUE support. LOS with UE support  X 2 64% and 67%. LOS x 2 without UE support 54% and 28%. Min cues from PT for use of ankle strategy to improve COM control.   Stair negotiation training 2  x8 with min-CGA from PT and min-mod cuse for step to gait pattern and UE positioning.   Dynamic balance training to perform lateral reach and toss bean bag 2 x 8 to force Weight shift to the R. [facility] to [facility] reach and all needs met.     Session 2.   Pt received sitting in WC and agreeable to PT. Pt propelled WC to day room with out assist from PT x [id number] R hemi technique x 2 throughout treatment.  PT instructed pt in Modified  [place] balance exercises with hand out provided. Sit<>stand x 5, LAQ x 10, hip abduction x 10, HS curls x 10, mini squat x 10, tandem stance 2 x 10 sec and semi tandem 2 x10 Bil. Each completed Bil with CGA assist from PT as well as min cues for midline orientation and improved use of UE support to prevent L LOB.    Gait  training with HW x [id number] supervision assist. Pt required only min cues for step length and AD management in turns. No LOB noted throughout gait training.   Stand pivot transfer to and from Nustep with supervision assist and HW; min cues for AD placement improve safety. Nustep reciprocal movement training x 8 min with BLE and RUE. Min cues for symmetry and posture. Level 8>6 due to fatigue with increased time.   Patient returned to room and [facility] and all needs met.      Therapy Documentation Precautions:  Precautions Precautions: Fall Required Braces or Orthoses: Other Brace Other Brace: L CAM walker boot Restrictions Weight Bearing Restrictions: Yes LUE Weight Bearing: Non weight bearing(may lift light weight objects; cell, cup etc) LLE Weight Bearing: Weight bearing as tolerated Other Position/Activity Restrictions: L ankle WBAT in CAM walker boot Pain: Pain Assessment Pain Scale: 0-10 Pain Score: 2  Pain Type: Chronic pain Pain Location: Rib cage Pain Orientation: Left Pain Descriptors / Indicators: Aching Pain Intervention(s): Medication (See eMAR)    Therapy/Group: Individual Therapy  AustLorie Phenix3/2020, 10:26 AM

## 2018-06-11 ENCOUNTER — Inpatient Hospital Stay (HOSPITAL_COMMUNITY): Payer: Self-pay | Admitting: Occupational Therapy

## 2018-06-11 ENCOUNTER — Encounter (HOSPITAL_COMMUNITY): Payer: Self-pay | Admitting: Occupational Therapy

## 2018-06-11 ENCOUNTER — Ambulatory Visit (HOSPITAL_COMMUNITY): Payer: Self-pay | Admitting: Physical Therapy

## 2018-06-11 LAB — GLUCOSE, CAPILLARY
Glucose-Capillary: 151 mg/dL — ABNORMAL HIGH (ref 70–99)
Glucose-Capillary: 259 mg/dL — ABNORMAL HIGH (ref 70–99)
Glucose-Capillary: 139 mg/dL — ABNORMAL HIGH (ref 70–99)
Glucose-Capillary: 275 mg/dL — ABNORMAL HIGH (ref 70–99)

## 2018-06-11 MED ORDER — CLOPIDOGREL BISULFATE 75 MG PO TABS: 75.0000 mg | ORAL_TABLET | Freq: Every day | ORAL | 0 refills | Status: DC

## 2018-06-11 MED ORDER — OXYCODONE HCL 10 MG PO TABS: 10.0000 mg | ORAL_TABLET | Freq: Four times a day (QID) | ORAL | 0 refills | Status: DC | PRN

## 2018-06-11 MED ORDER — GLIMEPIRIDE 1 MG PO TABS: 1.0000 mg | ORAL_TABLET | Freq: Every day | ORAL | 0 refills | Status: DC

## 2018-06-11 MED ORDER — LISINOPRIL 10 MG PO TABS: 10.0000 mg | ORAL_TABLET | Freq: Every day | ORAL | 0 refills | Status: DC

## 2018-06-11 MED ORDER — GABAPENTIN 100 MG PO CAPS: 100.0000 mg | ORAL_CAPSULE | Freq: Three times a day (TID) | ORAL | 0 refills | Status: DC

## 2018-06-11 MED ORDER — ACETAMINOPHEN 325 MG PO TABS: 650.0000 mg | ORAL_TABLET | Freq: Four times a day (QID) | ORAL | Status: AC | PRN

## 2018-06-11 MED ORDER — ATORVASTATIN CALCIUM 40 MG PO TABS: 40.0000 mg | ORAL_TABLET | Freq: Every day | ORAL | 0 refills | Status: DC

## 2018-06-11 MED ORDER — VENLAFAXINE HCL ER 75 MG PO CP24: 75.0000 mg | ORAL_CAPSULE | Freq: Every day | ORAL | 0 refills | Status: DC

## 2018-06-11 NOTE — Progress Notes (Signed)
Occupational Therapy Discharge Summary  Patient Details  Name: Carrie King MRN: [id number] Date of Birth: Aug 24, 1955  Today's Date: 06/11/2018 OT Individual Time: 0518-3358 and 1230-1330 OT Individual Time Calculation (min): 60 min and 60 min    Patient has met 10 of 10 long term goals due to improved activity tolerance, improved balance and ability to compensate for deficits.  Patient to discharge at overall mod I - S level level.  Patient's care partner is independent to provide the necessary supervision assistance at discharge.  Daughter, Museum/gallery conservator, came for hands on family education. OT reviewed bledsoe brace, wearing schedule, supervision level goals for safety, HEP for self ROM, and PROM techniques.  Reasons goals not met: all goals met  Recommendation:  Patient will benefit from ongoing skilled OT services in home health setting to continue to advance functional skills in the area of BADL and iADL.  Equipment: No equipment provided  Reasons for discharge: treatment goals met  Patient/family agrees with progress made and goals achieved: Yes   OT Intervention: Session 1: Upon entering the room, pt supine in bed with no c/o pain this session and agreeable to OT intervention. Pt performed supine >sit at mod I level and donning L LE CAM boot independently. Pt ambulating into bathroom with supervision and all needed items to shower. Pt bathing from seated position on TTB with overall intermittent supervision level. Pt donning clothing items from seated position on edge of TTB at overall mod I level. Pt returning to room and seated in wheelchair. OT placed bledsoe brace onto L UE into 10 degrees extension with pt tolerating well. Pt standing at sink for grooming tasks at overall mod I level. Pt seated in wheelchair at end of session. Call bell and all needed items within reach upon exiting the room.  Session 2: Upon entering the room, pt seated in wheelchair with daughter, Luetta Nutting,  present for hands on family education this session. OT reviewing goals with caregiver and discussing pt's progress. OT demonstrated use of bledsoe brace, wearing schedule, and how to adjust with caregiver returning demonstrations correctly. Pt demonstrated HEP for caregiver and then OT demonstrated PROM program for L UE. Caregiver returning demonstrations and patient also giving cuing as needed for correct technique. OT educated caregiver and pt on expectations for discharge tomorrow and recommendations for [place] intervention. Caregiver ambulating to bathroom with pt and providing correct amount of assistance to keep pt safe. Pt returning to wheelchair with call bell and all needed items within reach upon exiting the room.   OT Discharge Precautions/Restrictions  Precautions Precautions: Fall Required Braces or Orthoses: Other Brace Other Brace: L UE  bledsoe brace during day - 2 hour schedule flexion then extension Restrictions Weight Bearing Restrictions: Yes LUE Weight Bearing: Non weight bearing LLE Weight Bearing: Weight bearing as tolerated Other Position/Activity Restrictions: L ankle WBAT in CAM walker boot Vital Signs Therapy Vitals Temp: 97.9 F (36.6 C) Pulse Rate: 88 Resp: 19 BP: (!) 163/80 Patient Position (if appropriate): Lying Oxygen Therapy SpO2: 99 % O2 Device: Room Air Pain Pain Assessment Pain Scale: 0-10 Pain Score: 4  Pain Type: Acute pain Pain Location: Arm Pain Orientation: Left Pain Radiating Towards: shoulder Pain Descriptors / Indicators: Aching;Discomfort Pain Frequency: Constant Pain Onset: On-going Patients Stated Pain Goal: 2 Pain Intervention(s): Repositioned;Heat applied ADL ADL Eating: Set up Grooming: Setup Upper Body Bathing: Minimal assistance Where Assessed-Upper Body Bathing: Sitting at sink Lower Body Bathing: Minimal assistance Where Assessed-Lower Body Bathing: Sitting at sink Upper Body  Dressing: Minimal assistance Where  Assessed-Upper Body Dressing: Sitting at sink Lower Body Dressing: Minimal assistance Where Assessed-Lower Body Dressing: Sitting at sink Toileting: Minimal assistance Where Assessed-Toileting: Glass blower/designer: Psychiatric nurse Method: Arts development officer: Grab bars Vision Baseline Vision/History: No visual deficits;Wears glasses Wears Glasses: Distance only Patient Visual Report: No change from baseline Vision Assessment?: No apparent visual deficits Cognition Overall Cognitive Status: Within Functional Limits for tasks assessed Arousal/Alertness: Awake/alert Orientation Level: Oriented X4 Sensation Sensation Light Touch: Appears Intact Hot/Cold: Appears Intact Stereognosis: Appears Intact Coordination Gross Motor Movements are Fluid and Coordinated: No Fine Motor Movements are Fluid and Coordinated: No Coordination and Movement Description: impaired but WFLs Mobility  Bed Mobility Bed Mobility: Rolling Right;Rolling Left;Supine to Sit;Sit to Supine Rolling Right: Independent Rolling Left: Independent Supine to Sit: Independent Sit to Supine: Independent Transfers Sit to Stand: Independent with assistive device Stand to Sit: Independent with assistive device  Trunk/Postural Assessment  Cervical Assessment Cervical Assessment: Within Functional Limits Thoracic Assessment Thoracic Assessment: Within Functional Limits Lumbar Assessment Lumbar Assessment: Within Functional Limits  Balance Dynamic Sitting Balance Dynamic Sitting - Balance Support: No upper extremity supported;Feet supported;During functional activity Dynamic Sitting - Level of Assistance: 7: Independent Static Standing Balance Static Standing - Balance Support: Right upper extremity supported;During functional activity Static Standing - Level of Assistance: 6: Modified independent (Device/Increase time) Dynamic Standing Balance Dynamic Standing - Balance  Support: Right upper extremity supported;During functional activity Dynamic Standing - Level of Assistance: 6: Modified independent (Device/Increase time) Extremity/Trunk Assessment RUE Assessment RUE Assessment: Within Functional Limits LUE Assessment Passive Range of Motion (PROM) Comments: limited flexion and extension:  110 elbow flex, -20 extension Active Range of Motion (AROM) Comments: elbow flexion 80    Vallerie Hentz P 06/11/2018, 9:01 AM

## 2018-06-11 NOTE — Progress Notes (Signed)
Physical Therapy Discharge Summary  Patient Details  Name: Carrie King MRN: [id number] Date of Birth: Jun 08, 1955  Today's Date: 06/11/2018 PT Individual Time: 1330-1430 PT Individual Time Calculation (min): 60 min    Patient has met 5 of 5 long term goals due to improved activity tolerance, improved balance, improved postural control, increased strength, decreased pain, ability to compensate for deficits and improved coordination.  Patient to discharge at an ambulatory level Supervision.   Patient's care partner is independent to provide the necessary physical assistance at discharge.  Reasons goals not met: All PT goals met   Recommendation:  Patient will benefit from ongoing skilled PT services in home health setting to continue to advance safe functional mobility, address ongoing impairments in balance, coordination, dizziness, gait, transfers, and minimize fall risk.  Equipment: HW  Reasons for discharge: treatment goals met and discharge from hospital  Patient/family agrees with progress made and goals achieved: Yes    PT treatment.  PT instructed pt in Grad day assessment to measure progress toward goals. See below for details. Pt's daughter present for family education. Educated pt  And daughter on safety and proper cueing for improved safety with gait, balance, transfer to and from [facility] and car at SUV height. Pts daughter able to provide supervision assist for all mobility. Patient returned to room and left sitting in [facility] with call bell in reach and all needs met.     PT Discharge Precautions/Restrictions Precautions Precautions: Fall Required Braces or Orthoses: Other Brace Other Brace: L UE  bledsoe brace during day - 2 hour schedule flexion then extension Restrictions Weight Bearing Restrictions: Yes LUE Weight Bearing: Non weight bearing LLE Weight Bearing: Weight bearing as tolerated Other Position/Activity Restrictions: L ankle WBAT in CAM walker boot  Pain    denies Vision/Perception    WFL Cognition Overall Cognitive Status: Within Functional Limits for tasks assessed Arousal/Alertness: Awake/alert Orientation Level: Oriented X4 Sensation Sensation Light Touch: Appears Intact Hot/Cold: Appears Intact Stereognosis: Appears Intact Coordination Gross Motor Movements are Fluid and Coordinated: No Fine Motor Movements are Fluid and Coordinated: No Coordination and Movement Description: impaired but WFLs Motor  Motor Motor: Abnormal postural alignment and control Motor - Discharge Observations: mild L lateral lean due to poor trunk control and vestibular   Mobility Bed Mobility Bed Mobility: Rolling Right;Rolling Left;Supine to Sit;Sit to Supine Rolling Right: Independent Rolling Left: Independent Supine to Sit: Independent Sit to Supine: Independent Transfers Sit to Stand: Independent with assistive device Stand to Sit: Independent with assistive device Stand Pivot Transfers: Independent with assistive device Transfer (Assistive device): Hemi-walker Locomotion  Gait Ambulation: Yes Gait Assistance: Supervision/Verbal cueing Gait Distance (Feet): 150 Feet Assistive device: Hemi-walker Gait Gait: Yes Gait Pattern: Impaired Gait Pattern: Step-to pattern Wheelchair Mobility Wheelchair Mobility: Yes Wheelchair Assistance: Independent with assistive device Wheelchair Propulsion: Both lower extermities  Trunk/Postural Assessment  Cervical Assessment Cervical Assessment: Within Functional Limits Thoracic Assessment Thoracic Assessment: Within Functional Limits Lumbar Assessment Lumbar Assessment: Within Functional Limits Postural Control Postural Control: Deficits on evaluation Trunk Control: Mild L lateral lean intermittently   Balance Static Sitting Balance Static Sitting - Balance Support: No upper extremity supported;Feet supported Static Sitting - Level of Assistance: 6: Modified independent (Device/Increase  time) Dynamic Sitting Balance Dynamic Sitting - Balance Support: No upper extremity supported;Feet supported;During functional activity Dynamic Sitting - Level of Assistance: 6: Modified independent (Device/Increase time) Static Standing Balance Static Standing - Balance Support: No upper extremity supported Static Standing - Level of Assistance: 5: Stand by  assistance Dynamic Standing Balance Dynamic Standing - Balance Support: No upper extremity supported Dynamic Standing - Level of Assistance: 5: Stand by assistance Extremity Assessment      RLE Assessment RLE Assessment: Within Functional Limits General Strength Comments: 5/5 proximal ot distal  LLE Assessment LLE Assessment: Within Functional Limits General Strength Comments: 5/5 proximal to distal except ankle in cam boot.     Carrie King 06/11/2018, 2:33 PM

## 2018-06-11 NOTE — Discharge Instructions (Signed)
Inpatient Rehab Discharge Instructions  Carrie King Discharge date and time: No discharge date for patient encounter.   Activities/Precautions/ Functional Status: Activity: Nonweightbearing left upper extremity and weightbearing as tolerated left lower extremity with Cam boot Diet: diabetic diet Wound Care: keep wound clean and dry Functional status:  ___ No restrictions     ___ Walk up steps independently ___ 24/7 supervision/assistance   ___ Walk up steps with assistance ___ Intermittent supervision/assistance  ___ Bathe/dress independently ___ Walk with walker     _x__ Bathe/dress with assistance ___ Walk Independently    ___ Shower independently ___ Walk with assistance    ___ Shower with assistance ___ No alcohol     ___ Return to work/school ________  Special Instructions: No driving smoking or alcohol  Aspirin and Plavix x3 weeks then aspirin alone   COMMUNITY REFERRALS UPON DISCHARGE:    Home Health:   PT, OT, RN   Agency:[facility]   Phone:[phone]   Date of last service:06/12/2018  Medical Equipment/Items Ordered:HEMI WALKER  Agency/Supplier:[facility]   [phone] Other:[place] AND MEDICAID APPLICATIONS ARE PENDING  GENERAL COMMUNITY RESOURCES FOR PATIENT/FAMILY: Support Groups:[facility] SUPPORT GROUP THE SECOND Thursday OF EACH MONTH ( SEPT-MAY ) AT 6:00-7:00 PM QUESTIONS CALL AMY [phone]  STROKE/TIA DISCHARGE INSTRUCTIONS SMOKING Cigarette smoking nearly doubles your risk of having a stroke & is the single most alterable risk factor  If you smoke or have smoked in the last 12 months, you are advised to quit smoking for your health.  Most of the excess cardiovascular risk related to smoking disappears within a year of stopping.  Ask you doctor about anti-smoking medications  [place] Quit Line: 1-800-QUIT NOW  Free Smoking Cessation Classes [phone]  CHOLESTEROL Know your levels; limit fat & cholesterol in your diet  Lipid Panel       Component Value Date/Time   CHOL 227 (H) 05/31/2018 0456   TRIG 123 05/31/2018 0456   HDL 68 05/31/2018 0456   CHOLHDL 3.3 05/31/2018 0456   VLDL 25 05/31/2018 0456   LDLCALC 134 (H) 05/31/2018 0456      Many patients benefit from treatment even if their cholesterol is at goal.  Goal: Total Cholesterol (CHOL) less than 160  Goal:  Triglycerides (TRIG) less than 150  Goal:  HDL greater than 40  Goal:  LDL (LDLCALC) less than 100   BLOOD PRESSURE American Stroke Association blood pressure target is less that 120/80 mm/Hg  Your discharge blood pressure is:  BP: (!) 171/79  Monitor your blood pressure  Limit your salt and alcohol intake  Many individuals will require more than one medication for high blood pressure  DIABETES (A1c is a blood sugar average for last 3 months) Goal HGBA1c is under 7% (HBGA1c is blood sugar average for last 3 months)  Diabetes:     Lab Results  Component Value Date   HGBA1C 7.3 (H) 05/31/2018     Your HGBA1c can be lowered with medications, healthy diet, and exercise.  Check your blood sugar as directed by your physician  Call your physician if you experience unexplained or low blood sugars.  PHYSICAL ACTIVITY/REHABILITATION Goal is 30 minutes at least 4 days per week  Activity: Increase activity slowly, Therapies: Physical Therapy: Home Health Return to work:   Activity decreases your risk of heart attack and stroke and makes your heart stronger.  It helps control your weight and blood pressure; helps you relax and can improve your mood.  Participate in a regular  exercise program.  Talk with your doctor about the best form of exercise for you (dancing, walking, swimming, cycling).  DIET/WEIGHT Goal is to maintain a healthy weight  Your discharge diet is:  Diet Order            Diet Carb Modified Fluid consistency: Thin; Room service appropriate? Yes with Assist  Diet effective now              liquids Your height is:  Height: 5'  5" (165.1 cm) Your current weight is: Weight: 71.6 kg Your Body Mass Index (BMI) is:  BMI (Calculated): 26.27  Following the type of diet specifically designed for you will help prevent another stroke.  Your goal weight range is:    Your goal Body Mass Index (BMI) is 19-24.  Healthy food habits can help reduce 3 risk factors for stroke:  High cholesterol, hypertension, and excess weight.  RESOURCES Stroke/Support Group:  Call [phone]   STROKE EDUCATION PROVIDED/REVIEWED AND GIVEN TO PATIENT Stroke warning signs and symptoms How to activate emergency medical system (call 911). Medications prescribed at discharge. Need for follow-up after discharge. Personal risk factors for stroke. Pneumonia vaccine given:  Flu vaccine given:  My questions have been answered, the writing is legible, and I understand these instructions.  I will adhere to these goals & educational materials that have been provided to me after my discharge from the hospital.      My questions have been answered and I understand these instructions. I will adhere to these goals and the provided educational materials after my discharge from the hospital.  Patient/Caregiver Signature _______________________________ Date __________  Clinician Signature _______________________________________ Date __________  Please bring this form and your medication list with you to all your follow-up doctor's appointments.
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
## 2018-06-11 NOTE — Progress Notes (Signed)
Social Work  Discharge Note  The overall goal for the admission was met for:   Discharge location: [place] TO ASSIST  Length of Stay: Yes-10 DAYS  Discharge activity level: Yes-SUPERVISION LEVEL  Home/community participation: Yes  Services provided included: MD, RD, PT, OT, RN, CM, Pharmacy, Neuropsych and SW  Financial Services: Other: PENDING MEDICAID  Follow-up services arranged: Home Health: [facility], DME: [facility] and Patient/Family request agency HH: [facility], DME: NO PREF  Comments (or additional information):DAUGHTER WAS HERE FOR FAMILY TRAINING AND IT WENT WELL BOTH FEEL READY FOR DC TOMORROW. SSD AND MEDICAID APPLICATIONS PENDING. HAD MATCH LAST ADMISSION NOT ELIGIBLE THIS ADMIT. PT MADE AWARE OF THIS  Patient/Family verbalized understanding of follow-up arrangements: Yes  Individual responsible for coordination of the follow-up plan: SELF & AMBER-DAUGHTER  Confirmed correct DME delivered: Elease Hashimoto 06/11/2018    Elease Hashimoto

## 2018-06-12 DIAGNOSIS — S42352S Displaced comminuted fracture of shaft of humerus, left arm, sequela: Secondary | ICD-10-CM

## 2018-06-12 LAB — GLUCOSE, CAPILLARY: Glucose-Capillary: 168 mg/dL — ABNORMAL HIGH (ref 70–99)
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
## 2018-06-12 NOTE — Progress Notes (Signed)
Patient given discharge instructions by Jesusita Oka, [place]. And all patients questions and concerns were addressed. Patient wheeled down via wheelchair by nurse tech with all personal belongings.  Lorri Frederick, LPN

## 2018-06-15 ENCOUNTER — Telehealth: Payer: Self-pay | Admitting: *Deleted

## 2018-06-15 ENCOUNTER — Ambulatory Visit: Payer: Self-pay

## 2018-06-15 NOTE — Telephone Encounter (Signed)
Transitional care call completed, appointment confirmed, addresss confirmed, new patient packet mailed  Transitional Care Questions   Questions for our staff to ask patients on Transitional care 48 hour phone call:   1. Are you/is patient experiencing any problems since coming home? No Are there any questions regarding any aspect of care? No  2. Are there any questions regarding medications administration/dosing? No Are meds being taken as prescribed? Yes Patient should review meds with caller to confirm   3. Have there been any falls? No  4. Has Home Health been to the house and/or have they contacted you? Not yet If not, have you tried to contact them?  No Can we help you contact them? No  5. Are bowels and bladder emptying properly? Yes Are there any unexpected incontinence issues? No If applicable, is patient following bowel/bladder programs?   6. Any fevers, problems with breathing, unexpected pain? No. No, some pain in the left arm  7. Are there any skin problems or new areas of breakdown? No   8. Has the patient/family member arranged specialty MD follow up (ie cardiology/neurology/renal/surgical/etc)? No, phone numbers given to patient to call Can we help arrange?   9. Does the patient need any other services or support that we can help arrange? No  10. Are caregivers following through as expected in assisting the patient? yes  11. Has the patient quit smoking, drinking alcohol, or using drugs as recommended? Patient qut smoking, does not drink, and uses a small amount of THC to help her sleep at night

## 2018-06-24 ENCOUNTER — Other Ambulatory Visit: Payer: Self-pay

## 2018-06-24 ENCOUNTER — Ambulatory Visit: Payer: Medicaid Other | Attending: Family Medicine | Admitting: Family Medicine

## 2018-06-24 DIAGNOSIS — S8256XA Nondisplaced fracture of medial malleolus of unspecified tibia, initial encounter for closed fracture: Secondary | ICD-10-CM

## 2018-06-24 DIAGNOSIS — I639 Cerebral infarction, unspecified: Secondary | ICD-10-CM

## 2018-06-24 DIAGNOSIS — M5020 Other cervical disc displacement, unspecified cervical region: Secondary | ICD-10-CM | POA: Diagnosis not present

## 2018-06-24 DIAGNOSIS — F32A Depression, unspecified: Secondary | ICD-10-CM | POA: Insufficient documentation

## 2018-06-24 DIAGNOSIS — F419 Anxiety disorder, unspecified: Secondary | ICD-10-CM

## 2018-06-24 DIAGNOSIS — I10 Essential (primary) hypertension: Secondary | ICD-10-CM | POA: Diagnosis not present

## 2018-06-24 DIAGNOSIS — M5030 Other cervical disc degeneration, unspecified cervical region: Secondary | ICD-10-CM

## 2018-06-24 DIAGNOSIS — E1169 Type 2 diabetes mellitus with other specified complication: Secondary | ICD-10-CM

## 2018-06-24 DIAGNOSIS — F329 Major depressive disorder, single episode, unspecified: Secondary | ICD-10-CM

## 2018-06-24 MED ORDER — TRAMADOL HCL 50 MG PO TABS: 50.0000 mg | ORAL_TABLET | Freq: Every day | ORAL | 0 refills | Status: DC

## 2018-06-24 MED ORDER — GABAPENTIN 100 MG PO CAPS: 100.0000 mg | ORAL_CAPSULE | Freq: Three times a day (TID) | ORAL | 3 refills | Status: DC

## 2018-06-24 MED ORDER — ATORVASTATIN CALCIUM 40 MG PO TABS: 40.0000 mg | ORAL_TABLET | Freq: Every day | ORAL | 3 refills | Status: DC

## 2018-06-24 MED ORDER — LISINOPRIL 10 MG PO TABS: 10.0000 mg | ORAL_TABLET | Freq: Every day | ORAL | 3 refills | Status: DC

## 2018-06-24 MED ORDER — VENLAFAXINE HCL ER 75 MG PO CP24: 75.0000 mg | ORAL_CAPSULE | Freq: Every day | ORAL | 3 refills | Status: DC

## 2018-06-24 MED ORDER — GLIMEPIRIDE 1 MG PO TABS: 1.0000 mg | ORAL_TABLET | Freq: Every day | ORAL | 3 refills | Status: DC

## 2018-06-24 NOTE — Progress Notes (Signed)
Patient has been called and DOB has been verified. Patient has been screened and transferred to PCP to start phone visit.     
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
## 2018-06-25 ENCOUNTER — Encounter: Payer: Self-pay | Admitting: Registered Nurse

## 2018-06-25 ENCOUNTER — Ambulatory Visit (INDEPENDENT_AMBULATORY_CARE_PROVIDER_SITE_OTHER): Payer: Self-pay | Admitting: Student

## 2018-06-25 ENCOUNTER — Encounter: Payer: Medicaid Other | Attending: Physical Medicine & Rehabilitation | Admitting: Physical Medicine & Rehabilitation

## 2018-06-25 ENCOUNTER — Other Ambulatory Visit: Payer: Self-pay

## 2018-06-25 ENCOUNTER — Telehealth: Payer: Self-pay | Admitting: Student

## 2018-06-25 VITALS — BP 141/81 | HR 95 | Temp 98.8°F | Ht 64.0 in | Wt 160.0 lb

## 2018-06-25 DIAGNOSIS — G464 Cerebellar stroke syndrome: Secondary | ICD-10-CM

## 2018-06-25 DIAGNOSIS — I639 Cerebral infarction, unspecified: Secondary | ICD-10-CM

## 2018-06-25 DIAGNOSIS — S42492B Other displaced fracture of lower end of left humerus, initial encounter for open fracture: Secondary | ICD-10-CM | POA: Insufficient documentation

## 2018-06-25 DIAGNOSIS — G463 Brain stem stroke syndrome: Secondary | ICD-10-CM

## 2018-06-25 LAB — CUP PACEART INCLINIC DEVICE CHECK
Date Time Interrogation Session: 20200528100822
Implantable Pulse Generator Implant Date: 20200504

## 2018-06-25 NOTE — Progress Notes (Signed)
ILR wound check in clinic. Steri strips had fallen off previously. Wound well healed. Home monitor transmitting successfully. No episodes. R wave 0.30. Questions answered.   Casimiro Needle [address] [place], PA-C 06/25/2018 10:12 AM

## 2018-06-25 NOTE — Telephone Encounter (Signed)
Need to troubleshoot LINQ transmission connection. No way to leave message (continued to ring)
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
## 2018-06-25 NOTE — Telephone Encounter (Signed)
Discussed with husband. Carelink transmitter had been plugged in living room. Will move to bedside table and send manual transmission.    Casimiro Needle [address]" [place], PA-C 06/25/2018 10:50 AM

## 2018-06-25 NOTE — Patient Instructions (Signed)
Dr Jena Gauss will advise about CAM walker boot

## 2018-06-26 NOTE — Telephone Encounter (Signed)
Spoke with pt, attempted troubleshooting home monitor. Seems to have trouble connecting to cell towers. Referred pt to tech services. Educated pt on how to submit manual transmission. Will f/u Monday 06/29/18 if transmission has not been received. No further questions at this time.

## 2018-06-26 NOTE — Telephone Encounter (Signed)
Spoke to pt husband, pt still sleeping. Requested call after lunch.

## 2018-06-29 NOTE — Telephone Encounter (Signed)
No answer and unable to [facility]. Attempting to f/u on troubleshooting pt home monitor.

## 2018-06-30 NOTE — Telephone Encounter (Signed)
Spoke w/ pt and she stated she talked to tech support on Monday 06/29/2018 and they are sending her an adapter to hook up to her Internet. She should have this in 7-10 business days.

## 2018-07-06 ENCOUNTER — Ambulatory Visit (INDEPENDENT_AMBULATORY_CARE_PROVIDER_SITE_OTHER): Payer: Medicaid Other | Admitting: *Deleted

## 2018-07-06 DIAGNOSIS — I639 Cerebral infarction, unspecified: Secondary | ICD-10-CM | POA: Diagnosis not present

## 2018-07-06 LAB — CUP PACEART REMOTE DEVICE CHECK
Date Time Interrogation Session: 20200606183937
Implantable Pulse Generator Implant Date: 20200504

## 2018-07-07 ENCOUNTER — Telehealth: Payer: Self-pay | Admitting: Physical Medicine & Rehabilitation

## 2018-07-07 DIAGNOSIS — G464 Cerebellar stroke syndrome: Secondary | ICD-10-CM

## 2018-07-07 DIAGNOSIS — S42492B Other displaced fracture of lower end of left humerus, initial encounter for open fracture: Secondary | ICD-10-CM

## 2018-07-07 NOTE — Telephone Encounter (Signed)
Please make PT and OT referral to [facility]

## 2018-07-07 NOTE — Telephone Encounter (Signed)
[facility] called stating pt does not want to come there for Physical Therapy. They need closer to home. Would like to go somewhere in [place].

## 2018-07-09 NOTE — Telephone Encounter (Signed)
Orders placed.

## 2018-07-10 NOTE — Telephone Encounter (Signed)
I called the pt to f/u to see if she received her cell adapter. The pt states all she received was a paper to show her how to hook up the cell adapter. I  Told her if she do not receive the adapter next week to give the device clinic a call. I would help her get the cell adaptor that she needs.

## 2018-07-14 NOTE — Progress Notes (Signed)
Carelink Summary Report / Loop Recorder 

## 2018-07-17 NOTE — Telephone Encounter (Signed)
Pt states she still has not received cell adapter. Informed pt that we will be sending her one and to call the DC if she has any problems setting it up. Confirmed correct address. All questions answered.

## 2018-07-17 NOTE — Telephone Encounter (Signed)
Cell adapter mailed. 07-17-2018

## 2018-07-20 ENCOUNTER — Telehealth: Payer: Self-pay

## 2018-07-20 NOTE — Telephone Encounter (Signed)
Rn tried to call pt to update list. Number just rang unable to leave vm.

## 2018-07-22 ENCOUNTER — Ambulatory Visit (INDEPENDENT_AMBULATORY_CARE_PROVIDER_SITE_OTHER): Payer: Self-pay | Admitting: Adult Health

## 2018-07-22 ENCOUNTER — Other Ambulatory Visit: Payer: Self-pay

## 2018-07-22 ENCOUNTER — Encounter: Payer: Self-pay | Admitting: Adult Health

## 2018-07-22 ENCOUNTER — Telehealth: Payer: Self-pay | Admitting: Adult Health

## 2018-07-22 DIAGNOSIS — I10 Essential (primary) hypertension: Secondary | ICD-10-CM

## 2018-07-22 DIAGNOSIS — I639 Cerebral infarction, unspecified: Secondary | ICD-10-CM

## 2018-07-22 DIAGNOSIS — R29818 Other symptoms and signs involving the nervous system: Secondary | ICD-10-CM

## 2018-07-22 DIAGNOSIS — E1169 Type 2 diabetes mellitus with other specified complication: Secondary | ICD-10-CM

## 2018-07-22 DIAGNOSIS — E785 Hyperlipidemia, unspecified: Secondary | ICD-10-CM

## 2018-07-22 NOTE — Telephone Encounter (Signed)
Called and spoke to patient and she relayed that her husband had already applied for her some assistance patient is going to have her husband call me 07/23/2018 with details .

## 2018-07-22 NOTE — Telephone Encounter (Signed)
hey! i just had a VV with Carrie King DOB April 10, 2055. she is currently medicaid pending and i was telling her about the [place] financial assistance program. she can do therapy bc she isn't covered and she also needs a sleep study for possible OSA. are you able to call her to give her info on that? she is a super nice lady    Jayme Cloud will call patient and tell her about patient assistance.
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
## 2018-07-23 NOTE — Progress Notes (Signed)
I agree with the above plan 

## 2018-07-23 NOTE — Telephone Encounter (Signed)
Patient's Husband called me back and asked to mail him the form and I also relayed for him to take his wife's  medical bills  With him from [place] and to go in person. Husband understood details about [facility] patient assistance .

## 2018-07-24 NOTE — Telephone Encounter (Signed)
Attempted to f/u to see if the pt received her Cell Adapter that I mailed to her on 07-17-2018

## 2018-07-27 ENCOUNTER — Other Ambulatory Visit: Payer: Self-pay

## 2018-07-27 ENCOUNTER — Encounter: Payer: Self-pay | Admitting: Family Medicine

## 2018-07-27 ENCOUNTER — Ambulatory Visit: Payer: Medicaid Other | Attending: Family Medicine | Admitting: Family Medicine

## 2018-07-27 DIAGNOSIS — E1169 Type 2 diabetes mellitus with other specified complication: Secondary | ICD-10-CM

## 2018-07-27 DIAGNOSIS — M5020 Other cervical disc displacement, unspecified cervical region: Secondary | ICD-10-CM | POA: Diagnosis not present

## 2018-07-27 DIAGNOSIS — M5030 Other cervical disc degeneration, unspecified cervical region: Secondary | ICD-10-CM

## 2018-07-27 NOTE — Telephone Encounter (Signed)
Attempted to call pt. No answer and unable to leave a message.

## 2018-07-27 NOTE — Progress Notes (Signed)
Virtual Visit via Telephone Note  I connected with Carrie King, on 07/27/2018 at 9:05 AM by telephone due to the COVID-19 pandemic and verified that I am speaking with the correct person using two identifiers.   Consent: I discussed the limitations, risks, security and privacy concerns of performing an evaluation and management service by telephone and the availability of in person appointments. I also discussed with the patient that there may be a patient responsible charge related to this service. The patient expressed understanding and agreed to proceed.   Location of Patient: Home  Location of Provider: Clinic   Persons participating in Telemedicine visit: Bryant L Gwenevere AbbotPatterson Alicia Farrington-CMA Dr. 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 Seen by neurology last week and has completed dual antiplatelet therapy with Plavix and aspirin and is currently on aspirin.  She also has a loop recorder in place.  She complains of withdrawal symptoms from insufficient tramadol as she tells me she was previously on 8 tablets of tramadol initially then 4 tablets of tramadol and 2 tablets on tramadol and was recently prescribed 1 tab daily at her last visit.  She complains of intermittent body aches which radiate down to her groin but she denies diarrhea, tremors.  She endorses using marijuana but does not drink alcohol. At her last telehealth visit with me I had  informed her of the need to have an in person visit to discuss pain management.  We do not have any of her previous medical records.  Past Medical History:  Diagnosis Date  . Diabetes mellitus without complication (HCC)   . Hypertension   . Stroke (HCC)   . Uncontrolled diabetes mellitus (HCC) 05/16/2018   No Known Allergies  Current Outpatient Medications on File Prior to Visit  Medication Sig Dispense Refill  . acetaminophen (TYLENOL) 325 MG tablet Take 2 tablets (650 mg total) by mouth every 6 (six) hours as needed for mild pain (or Fever >/= 101).    Marland Kitchen. aspirin EC 81 MG EC tablet Take 1 tablet (81 mg total) by mouth daily.    Marland Kitchen. atorvastatin (LIPITOR) 40 MG tablet Take 1 tablet (40 mg total) by mouth daily at 6 PM. 30 tablet 3  . gabapentin (NEURONTIN) 100 MG capsule Take 1 capsule (100 mg total) by mouth 3 (three) times daily. 90 capsule 3  . glimepiride (AMARYL) 1 MG tablet Take 1 tablet (1 mg total) by mouth daily with breakfast. 30 tablet 3  . lisinopril (ZESTRIL) 10 MG tablet Take 1 tablet (10 mg total) by mouth daily. 30 tablet 3  . traMADol (ULTRAM) 50 MG tablet Take 1 tablet (50 mg total) by mouth at bedtime. 30 tablet 0  . venlafaxine XR (EFFEXOR-XR) 75 MG 24 hr capsule Take 1 capsule (75 mg total) by mouth daily. 30 capsule 3   No current facility-administered medications on file prior to visit.     Observations/Objective: Awake, alert, oriented x3 Not in acute distress   Assessment and Plan 1. Type 2 diabetes mellitus with other specified  complication, without long-term current use of insulin (HCC) Controlled with A1c of 7.3 Continue current management Counseled on Diabetic diet, my plate method, 119 minutes of moderate intensity exercise/week Keep blood sugar logs with fasting goals of 80-120 mg/dl, random of less than 180 and in the event of sugars less than 60 mg/dl or greater than 400 mg/dl please notify the clinic ASAP. It is recommended that you undergo annual  eye exams and annual foot exams. Pneumonia vaccine is recommended.   2. Bulging of cervical intervertebral disc Currently on tramadol Advised to increase tramadol to 1 tablet twice daily We will need to obtain her previous medical records ,discuss opioid risk tool, expectations and signed pain agreement at next visit Advised that to get on pain management with our clinic she will need to discontinue THC  Follow Up Instructions: 1 week.   I discussed the assessment and treatment plan with the patient. The patient was provided an opportunity to ask questions and all were answered. The patient agreed with the plan and demonstrated an understanding of the instructions.   The patient was advised to call back or seek an in-person evaluation if the symptoms worsen or if the condition fails to improve as anticipated.     I provided 10 minutes total of non-face-to-face time during this encounter including median intraservice time, reviewing previous notes, labs, imaging, medications, management and patient verbalized understanding.     Charlott Rakes, MD, FAAFP. [facility] and [place], [place]   07/27/2018, 9:05 AM

## 2018-07-27 NOTE — Progress Notes (Signed)
Patient has been called and DOB has been verified. Patient has been screened and transferred to PCP to start phone visit.    Patient states that she is having withdrawal symptoms form tramadol being decreased.

## 2018-07-29 NOTE — Telephone Encounter (Signed)
Pt stated that she has received her Internet adapter. Instructed her to call tech support for help setting up the Internet adapter. Pt verbalized understanding.

## 2018-08-03 ENCOUNTER — Other Ambulatory Visit: Payer: Self-pay

## 2018-08-03 ENCOUNTER — Encounter: Payer: Self-pay | Admitting: Family Medicine

## 2018-08-03 ENCOUNTER — Ambulatory Visit: Payer: Medicaid Other | Attending: Family Medicine | Admitting: Family Medicine

## 2018-08-03 VITALS — BP 147/78 | HR 86 | Temp 98.3°F | Ht 64.0 in | Wt 166.2 lb

## 2018-08-03 DIAGNOSIS — F1210 Cannabis abuse, uncomplicated: Secondary | ICD-10-CM

## 2018-08-03 DIAGNOSIS — E1169 Type 2 diabetes mellitus with other specified complication: Secondary | ICD-10-CM

## 2018-08-03 DIAGNOSIS — M5030 Other cervical disc degeneration, unspecified cervical region: Secondary | ICD-10-CM

## 2018-08-03 DIAGNOSIS — M5020 Other cervical disc displacement, unspecified cervical region: Secondary | ICD-10-CM

## 2018-08-03 LAB — GLUCOSE, POCT (MANUAL RESULT ENTRY): POC Glucose: 235 mg/dl — AB (ref 70–99)

## 2018-08-03 MED ORDER — TRAMADOL HCL 50 MG PO TABS: 50.0000 mg | ORAL_TABLET | Freq: Every day | ORAL | 1 refills | Status: DC

## 2018-08-03 NOTE — Progress Notes (Signed)
Patient would like to discuss tramadol medication.

## 2018-08-03 NOTE — Progress Notes (Signed)
Subjective:  Patient ID: Carrie King, female    DOB: [birth year]  Age: 63 y.o. 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 At her last visit her tramadol prescription had to be increased from 50 mg daily to 50 mg twice daily as she had complained of discomfort in her lower abdomen which was severe and she had described discussed withdrawal symptoms. She has been on tramadol for greater than 10 years and we do not currently have any previous medical records.  We had also discussed discontinuing marijuana use and getting on a pain contract if she was to continue tramadol chronically but she informs me she last used marijuana yesterday. She currently does not have pain and feels she can be weaned off tramadol.  The site of her surgery on her left upper extremity is healing and she has been to see her surgeon.  Past Medical History:  Diagnosis Date  . Diabetes mellitus without complication (HCC)   . Hypertension   . Stroke (HCC)   . Uncontrolled diabetes mellitus (HCC) 05/16/2018    Past Surgical History:  Procedure Laterality Date  . EXTERNAL FIXATION ARM Left 05/14/2018   Procedure: IRRIGATION AND DEBRIDEMENT, EXTERNAL FIXATION LEFT ELBOW FRACTURE;  Surgeon: Roby LoftsHaddix, Kevin P, MD;  Location: [facility] OR;  Service: Orthopedics;  Laterality: Left;  . I&D EXTREMITY Left 05/18/2018   Procedure: IRRIGATION AND DEBRIDEMENT EXTREMITY;  Surgeon: Roby LoftsHaddix, Kevin P, MD;   Location: [facility] OR;  Service: Orthopedics;  Laterality: Left;  . LOOP RECORDER INSERTION N/A 06/01/2018   Procedure: LOOP RECORDER INSERTION;  Surgeon: Marinus Mawaylor, Gregg W, MD;  [facility];  Service: Cardiovascular;  Laterality: N/A;  . ORIF HUMERUS FRACTURE Left 05/18/2018   Procedure: OPEN REDUCTION INTERNAL FIXATION (ORIF) DISTAL HUMERUS FRACTURE;  Surgeon: Roby LoftsHaddix, Kevin P, MD;  Location: [facility] OR;  Service: Orthopedics;  Laterality: Left;  . ORIF ULNAR FRACTURE Left 05/18/2018   Procedure: OPEN REDUCTION INTERNAL FIXATION (ORIF) ULNAR FRACTURE;  Surgeon: Roby LoftsHaddix, Kevin P, MD;  Location: [facility] OR;  Service: Orthopedics;  Laterality: Left;    History reviewed. No pertinent family history.  No Known Allergies  Outpatient Medications Prior to Visit  Medication Sig Dispense Refill  . acetaminophen (TYLENOL) 325 MG tablet Take 2 tablets (650 mg total) by mouth every 6 (six) hours as needed for mild pain (or Fever >/= 101).    Marland Kitchen. aspirin EC 81 MG EC tablet Take 1 tablet (81 mg total) by mouth daily.    Marland Kitchen. atorvastatin (LIPITOR) 40 MG tablet Take 1 tablet (40 mg total) by mouth daily at 6 PM. 30 tablet 3  . gabapentin (NEURONTIN) 100 MG capsule Take 1 capsule (100 mg total) by mouth 3 (three) times daily. 90 capsule 3  . glimepiride (AMARYL) 1 MG tablet Take 1 tablet (1 mg total) by mouth daily with breakfast. 30 tablet 3  . lisinopril (ZESTRIL) 10 MG tablet Take 1 tablet (10 mg total) by mouth daily. 30 tablet 3  . venlafaxine XR (EFFEXOR-XR) 75 MG 24  hr capsule Take 1 capsule (75 mg total) by mouth daily. 30 capsule 3  . traMADol (ULTRAM) 50 MG tablet Take 1 tablet (50 mg total) by mouth at bedtime. 30 tablet 0   No facility-administered medications prior to visit.      ROS Review of Systems  Constitutional: Negative for activity change, appetite change and fatigue.  HENT: Negative for congestion, sinus pressure and sore throat.   Eyes: Negative for visual disturbance.  Respiratory:  Negative for cough, chest tightness, shortness of breath and wheezing.   Cardiovascular: Negative for chest pain and palpitations.  Gastrointestinal: Negative for abdominal distention, abdominal pain and constipation.  Endocrine: Negative for polydipsia.  Genitourinary: Negative for dysuria and frequency.  Musculoskeletal: Negative for arthralgias and back pain.  Skin: Negative for rash.  Neurological: Negative for tremors, light-headedness and numbness.  Hematological: Does not bruise/bleed easily.  Psychiatric/Behavioral: Negative for agitation and behavioral problems.    Objective:  BP (!) 147/78   Pulse 86   Temp 98.3 F (36.8 C) (Oral)   Ht 5\' 4"  (1.626 m)   Wt 166 lb 3.2 oz (75.4 kg)   SpO2 96%   BMI 28.53 kg/m   BP/Weight 08/03/2018 06/25/2018 06/12/2018  Systolic BP 147 141 145  Diastolic BP 78 81 82  Wt. (Lbs) 166.2 160 -  BMI 28.53 27.46 -      Physical Exam Constitutional:      Appearance: She is well-developed.  Neck:     Musculoskeletal: No neck rigidity or muscular tenderness.  Cardiovascular:     Rate and Rhythm: Normal rate.     Heart sounds: Normal heart sounds. No murmur.  Pulmonary:     Effort: Pulmonary effort is normal.     Breath sounds: Normal breath sounds. No wheezing or rales.  Chest:     Chest wall: No tenderness.  Abdominal:     General: Bowel sounds are normal. There is no distension.     Palpations: Abdomen is soft. There is no mass.     Tenderness: There is no abdominal tenderness.  Musculoskeletal:     Comments: Lateral aspect of left arm with healed surgical scar No tenderness on palpation of entire spine, normal range of motion  Neurological:     Mental Status: She is alert and oriented to person, place, and time.     Gait: Gait abnormal.  Psychiatric:        Mood and Affect: Mood normal.     CMP Latest Ref Rng & Units 06/03/2018 05/31/2018 05/30/2018  Glucose 70 - 99 mg/dL 604(V146(H) 409(W163(H) 119(J118(H)  BUN 8 - 23 mg/dL 47(W25(H) 11 14   Creatinine 0.44 - 1.00 mg/dL 2.950.90 6.210.79 3.080.71  Sodium 135 - 145 mmol/L 137 137 132(L)  Potassium 3.5 - 5.1 mmol/L 4.2 4.4 4.1  Chloride 98 - 111 mmol/L 106 102 99  CO2 22 - 32 mmol/L 24 22 23   Calcium 8.9 - 10.3 mg/dL 9.5 9.5 9.1  Total Protein 6.5 - 8.1 g/dL 6.4(L) - -  Total Bilirubin 0.3 - 1.2 mg/dL 0.5 - -  Alkaline Phos 38 - 126 U/L 231(H) - -  AST 15 - 41 U/L 15 - -  ALT 0 - 44 U/L 10 - -    Lipid Panel     Component Value Date/Time   CHOL 227 (H) 05/31/2018 0456   TRIG 123 05/31/2018 0456   HDL 68 05/31/2018 0456   CHOLHDL 3.3 05/31/2018 0456   VLDL 25 05/31/2018 0456   LDLCALC  134 (H) 05/31/2018 0456    CBC    Component Value Date/Time   WBC 5.2 06/03/2018 0542   RBC 3.16 (L) 06/03/2018 0542   HGB 9.6 (L) 06/03/2018 0542   HCT 30.9 (L) 06/03/2018 0542   PLT 517 (H) 06/03/2018 0542   MCV 97.8 06/03/2018 0542   MCH 30.4 06/03/2018 0542   MCHC 31.1 06/03/2018 0542   RDW 13.6 06/03/2018 0542   LYMPHSABS 1.6 06/03/2018 0542   MONOABS 0.5 06/03/2018 0542   EOSABS 0.1 06/03/2018 0542   BASOSABS 0.1 06/03/2018 0542    Lab Results  Component Value Date   HGBA1C 7.3 (H) 05/31/2018    Assessment & Plan:   1. Type 2 diabetes mellitus with other specified complication, without long-term current use of insulin (HCC) Controlled with A1c of 7.3 Continue current management - POCT glucose (manual entry)  2. Bulging of cervical intervertebral disc We have discussed the process of weaning her off tramadol-she will take 50 mg daily and on days she feels the low abdominal discomfort she will take an extra tramadol. No indication for signing a pain contract at this time also in the process of weaning We will still attempt to obtain her previous medical records Reviewed PDMP - traMADol (ULTRAM) 50 MG tablet; Take 1 tablet (50 mg total) by mouth at bedtime.  Dispense: 60 tablet; Refill: 1  3. Marijuana abuse Counseled on cessation I have informed him that we will not be  prescribing tramadol if she remains on marijuana   Meds ordered this encounter  Medications  . traMADol (ULTRAM) 50 MG tablet    Sig: Take 1 tablet (50 mg total) by mouth at bedtime.    Dispense:  60 tablet    Refill:  1    Currently on a taper    Follow-up: Return in about 2 months (around 10/04/2018) for follow up of chronic medical conditions.       Charlott Rakes, MD, FAAFP. [facility] and [place], [place]   08/03/2018, 3:05 PM

## 2018-08-06 ENCOUNTER — Encounter: Payer: Self-pay | Admitting: *Deleted

## 2018-08-06 ENCOUNTER — Telehealth: Payer: Self-pay

## 2018-08-06 DIAGNOSIS — M5030 Other cervical disc degeneration, unspecified cervical region: Secondary | ICD-10-CM

## 2018-08-06 DIAGNOSIS — M5020 Other cervical disc displacement, unspecified cervical region: Secondary | ICD-10-CM

## 2018-08-06 MED ORDER — TRAMADOL HCL 50 MG PO TABS: 50.0000 mg | ORAL_TABLET | Freq: Two times a day (BID) | ORAL | 1 refills | Status: DC

## 2018-08-06 NOTE — Telephone Encounter (Signed)
Patient tramadol script that was written on 08/03/2018 needs to state that patient can take up to 2 pills daily instead of one.

## 2018-08-06 NOTE — Telephone Encounter (Signed)
Done

## 2018-08-07 NOTE — Telephone Encounter (Signed)
Melissa,  Please call patient and make device clinic appointment for troubleshooting monitor. Please ask her to bring monitor and Internet adapter when she comes  Chanetta Marshall, NP 08/07/2018 7:36 AM

## 2018-08-24 ENCOUNTER — Telehealth: Payer: Self-pay

## 2018-08-24 NOTE — Telephone Encounter (Signed)
No answer. I was calling to do a Covid-19 prescreening questions.

## 2018-10-07 ENCOUNTER — Ambulatory Visit: Payer: Medicaid Other | Attending: Family Medicine | Admitting: Family Medicine

## 2018-10-07 ENCOUNTER — Other Ambulatory Visit: Payer: Self-pay

## 2018-10-07 ENCOUNTER — Encounter: Payer: Self-pay | Admitting: Family Medicine

## 2018-10-07 DIAGNOSIS — M5020 Other cervical disc displacement, unspecified cervical region: Secondary | ICD-10-CM | POA: Diagnosis not present

## 2018-10-07 DIAGNOSIS — F32A Depression, unspecified: Secondary | ICD-10-CM

## 2018-10-07 DIAGNOSIS — E1169 Type 2 diabetes mellitus with other specified complication: Secondary | ICD-10-CM

## 2018-10-07 DIAGNOSIS — F419 Anxiety disorder, unspecified: Secondary | ICD-10-CM

## 2018-10-07 DIAGNOSIS — I10 Essential (primary) hypertension: Secondary | ICD-10-CM

## 2018-10-07 DIAGNOSIS — F329 Major depressive disorder, single episode, unspecified: Secondary | ICD-10-CM

## 2018-10-07 DIAGNOSIS — M5030 Other cervical disc degeneration, unspecified cervical region: Secondary | ICD-10-CM

## 2018-10-07 MED ORDER — GABAPENTIN 100 MG PO CAPS: 100.0000 mg | ORAL_CAPSULE | Freq: Three times a day (TID) | ORAL | 1 refills | Status: DC

## 2018-10-07 MED ORDER — TRUE METRIX BLOOD GLUCOSE TEST VI STRP: ORAL_STRIP | 12 refills | Status: AC

## 2018-10-07 MED ORDER — VENLAFAXINE HCL ER 75 MG PO CP24: 75.0000 mg | ORAL_CAPSULE | Freq: Every day | ORAL | 6 refills | Status: DC

## 2018-10-07 MED ORDER — LISINOPRIL 10 MG PO TABS: 10.0000 mg | ORAL_TABLET | Freq: Every day | ORAL | 1 refills | Status: DC

## 2018-10-07 MED ORDER — TRUEPLUS LANCETS 28G MISC: 1.0000 | Freq: Three times a day (TID) | 12 refills | Status: AC

## 2018-10-07 MED ORDER — GLIMEPIRIDE 1 MG PO TABS: 1.0000 mg | ORAL_TABLET | Freq: Every day | ORAL | 1 refills | Status: DC

## 2018-10-07 MED ORDER — ATORVASTATIN CALCIUM 40 MG PO TABS: 40.0000 mg | ORAL_TABLET | Freq: Every day | ORAL | 1 refills | Status: DC

## 2018-10-07 MED ORDER — TRUE METRIX METER DEVI: 1.0000 | Freq: Three times a day (TID) | 0 refills | Status: AC

## 2018-10-07 MED FILL — LISINOPRIL 10 MG TABS: 10 | 30 days supply | Qty: 30 | Fill #0

## 2018-10-07 MED FILL — GABAPENTIN 100 MG CAP: 100 | 30 days supply | Qty: 90 | Fill #0

## 2018-10-07 MED FILL — ATORVASTATIN CALCIUM 40 MG: 40 | 30 days supply | Qty: 30 | Fill #0

## 2018-10-07 MED FILL — ACCU-CHEK GUIDE TEST STRIP: 30 days supply | Qty: 100 | Fill #0

## 2018-10-07 MED FILL — VENLAFAXINE HCL ER 75 MG CA: 75 | 30 days supply | Qty: 30 | Fill #0

## 2018-10-07 MED FILL — GLIMEPIRIDE 1 MG TABLET: 1 | 30 days supply | Qty: 30 | Fill #0

## 2018-10-07 MED FILL — ACCU-CHEK GUIDE W/DEVICE KI: W/DEVICE | 1 days supply | Qty: 1 | Fill #0

## 2018-10-07 MED FILL — ACCU-CHEK SOFTCLIX LANCETS: 30 days supply | Qty: 100 | Fill #0

## 2018-10-07 NOTE — Progress Notes (Signed)
Patient has been called and DOB has been verified. Patient has been screened and transferred to PCP to start phone visit.    Patient needs refills on medications. 

## 2018-10-07 NOTE — Progress Notes (Signed)
Virtual Visit via Telephone Note  I connected with Carrie King, on 10/07/2018 at 11:29 AM by telephone due to the COVID-19 pandemic and verified that I am speaking with the correct person using two identifiers.   Consent: I discussed the limitations, risks, security and privacy concerns of performing an evaluation and management service by telephone and the availability of in person appointments. I also discussed with the patient that there may be a patient responsible charge related to this service. The patient expressed understanding and agreed to proceed.   Location of Patient: Home  Location of Provider: Clinic   Persons participating in Telemedicine visit: Sabreena L Estrella Deeds Farrington-CMA Dr. Felecia Shelling     History of Present Illness: Carrie King 63 year old female with a history of type 2 diabetes mellitus (A1c 7.3), previous tobacco abuse (quit in 04/2018), depression, bulging cervical disc, R ACA CVA in 05/2018.  Currently on chronic tramadol for pain and has been unable to wean herself off it as she became sick when she tried to stop.  Now takes it twice a day but previously was on 4 times a day with her previous PCP.  Still uses marijuana once in a while but has cut back significantly Random blood sugars have ranged between 138 and 277 and she denies hypoglycemia.  Does not have her own glucometer and uses her husband's glucometer as she has no medical insurance. Denies chest pain, dyspnea and reports feeling fine with no complaints today.  Past Medical History:  Diagnosis Date  . Diabetes mellitus without complication ([place])   . Hypertension   . Stroke ([place])   . Uncontrolled diabetes mellitus ([place]) 05/16/2018   No Known Allergies  Current Outpatient Medications on File Prior to Visit  Medication Sig Dispense Refill  . acetaminophen (TYLENOL) 325 MG tablet Take 2 tablets (650 mg total) by mouth every 6 (six) hours as needed for mild pain (or Fever >/=  101).    Marland Kitchen aspirin EC 81 MG EC tablet Take 1 tablet (81 mg total) by mouth daily.    Marland Kitchen atorvastatin (LIPITOR) 40 MG tablet Take 1 tablet (40 mg total) by mouth daily at 6 PM. 30 tablet 3  . gabapentin (NEURONTIN) 100 MG capsule Take 1 capsule (100 mg total) by mouth 3 (three) times daily. 90 capsule 3  . glimepiride (AMARYL) 1 MG tablet Take 1 tablet (1 mg total) by mouth daily with breakfast. 30 tablet 3  . lisinopril (ZESTRIL) 10 MG tablet Take 1 tablet (10 mg total) by mouth daily. 30 tablet 3  . traMADol (ULTRAM) 50 MG tablet Take 1 tablet (50 mg total) by mouth 2 (two) times daily. 60 tablet 1  . venlafaxine XR (EFFEXOR-XR) 75 MG 24 hr capsule Take 1 capsule (75 mg total) by mouth daily. 30 capsule 3   No current facility-administered medications on file prior to visit.     Observations/Objective: Awake, alert, oriented x3 Not in acute distress  Lab Results  Component Value Date   HGBA1C 7.3 (H) 05/31/2018    Assessment and Plan: 1. Anxiety and depression Stable - venlafaxine XR (EFFEXOR-XR) 75 MG 24 hr capsule; Take 1 capsule (75 mg total) by mouth daily.  Dispense: 30 capsule; Refill: 6  2. Type 2 diabetes mellitus with other specified complication, without long-term current use of insulin (HCC) Controlled Counseled on Diabetic diet, my plate method, 614 minutes of moderate intensity exercise/week Keep blood sugar logs with fasting goals of 80-120 mg/dl, random of less than 180  and in the event of sugars less than 60 mg/dl or greater than 161400 mg/dl please notify the clinic ASAP. It is recommended that you undergo annual eye exams and annual foot exams. Pneumonia vaccine is recommended. - glimepiride (AMARYL) 1 MG tablet; Take 1 tablet (1 mg total) by mouth daily with breakfast.  Dispense: 90 tablet; Refill: 1 - gabapentin (NEURONTIN) 100 MG capsule; Take 1 capsule (100 mg total) by mouth 3 (three) times daily.  Dispense: 270 capsule; Refill: 1 - atorvastatin (LIPITOR) 40 MG  tablet; Take 1 tablet (40 mg total) by mouth daily at 6 PM.  Dispense: 90 tablet; Refill: 1  3. Essential hypertension Controlled Counseled on blood pressure goal of less than 130/80, low-sodium, DASH diet, medication compliance, 150 minutes of moderate intensity exercise per week. Discussed medication compliance, adverse effects. - lisinopril (ZESTRIL) 10 MG tablet; Take 1 tablet (10 mg total) by mouth daily.  Dispense: 90 tablet; Refill: 1  4. Bulging of cervical intervertebral disc Currently on tramadol We have discussed weaning off tramadol and she will try 1 tablet/day   Follow Up Instructions: 3 months  Encouraged to apply for the [place] financial discount to facilitate referral for colonoscopy, mammogram and other healthcare maintenance she needs.  Advised to come into the clinic for flu shot but she is concerned about the cost   I discussed the assessment and treatment plan with the patient. The patient was provided an opportunity to ask questions and all were answered. The patient agreed with the plan and demonstrated an understanding of the instructions.   The patient was advised to call back or seek an in-person evaluation if the symptoms worsen or if the condition fails to improve as anticipated.     I provided 15 minutes total of non-face-to-face time during this encounter including median intraservice time, reviewing previous notes, labs, imaging, medications, management and patient verbalized understanding.     Hoy RegisterEnobong Shaydon Lease, MD, FAAFP. [facility] [place], [place] [phone]   10/07/2018, 11:29 AM

## 2018-10-27 ENCOUNTER — Telehealth: Payer: Self-pay

## 2018-10-27 ENCOUNTER — Ambulatory Visit: Payer: No Typology Code available for payment source | Admitting: Adult Health

## 2018-10-27 ENCOUNTER — Encounter: Payer: Self-pay | Admitting: Adult Health

## 2018-10-27 NOTE — Telephone Encounter (Signed)
Patient was a no call/no show for their appointment today.   
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
## 2019-01-11 ENCOUNTER — Ambulatory Visit: Payer: Medicaid Other | Admitting: Family Medicine

## 2019-02-10 MED FILL — ATORVASTATIN CALCIUM 40 MG: 40 | 30 days supply | Qty: 30 | Fill #2

## 2019-02-10 MED FILL — GLIMEPIRIDE 1 MG TABLET: 1 | 30 days supply | Qty: 30 | Fill #2

## 2019-02-10 MED FILL — LISINOPRIL 10 MG TABS: 10 | 30 days supply | Qty: 30 | Fill #2

## 2019-02-10 MED FILL — VENLAFAXINE HCL ER 75 MG CA: 75 | 30 days supply | Qty: 30 | Fill #2

## 2019-06-15 ENCOUNTER — Other Ambulatory Visit: Payer: Self-pay

## 2019-06-15 ENCOUNTER — Emergency Department (HOSPITAL_COMMUNITY): Payer: Medicaid Other

## 2019-06-15 ENCOUNTER — Encounter (HOSPITAL_COMMUNITY): Payer: Self-pay

## 2019-06-15 ENCOUNTER — Inpatient Hospital Stay (HOSPITAL_COMMUNITY)
Admission: EM | Admit: 2019-06-15 | Discharge: 2019-06-18 | DRG: 065 | Disposition: A | Discharge status: Home / Self Care | Payer: Medicaid Other | Attending: Internal Medicine | Admitting: Internal Medicine

## 2019-06-15 DIAGNOSIS — M79602 Pain in left arm: Secondary | ICD-10-CM | POA: Diagnosis present

## 2019-06-15 DIAGNOSIS — R778 Other specified abnormalities of plasma proteins: Secondary | ICD-10-CM | POA: Diagnosis present

## 2019-06-15 DIAGNOSIS — Z9114 Patient's other noncompliance with medication regimen: Secondary | ICD-10-CM

## 2019-06-15 DIAGNOSIS — E663 Overweight: Secondary | ICD-10-CM | POA: Diagnosis present

## 2019-06-15 DIAGNOSIS — E785 Hyperlipidemia, unspecified: Secondary | ICD-10-CM | POA: Diagnosis present

## 2019-06-15 DIAGNOSIS — I639 Cerebral infarction, unspecified: Secondary | ICD-10-CM | POA: Diagnosis present

## 2019-06-15 DIAGNOSIS — I10 Essential (primary) hypertension: Secondary | ICD-10-CM | POA: Diagnosis present

## 2019-06-15 DIAGNOSIS — G9341 Metabolic encephalopathy: Secondary | ICD-10-CM | POA: Diagnosis present

## 2019-06-15 DIAGNOSIS — F419 Anxiety disorder, unspecified: Secondary | ICD-10-CM | POA: Diagnosis present

## 2019-06-15 DIAGNOSIS — Z7982 Long term (current) use of aspirin: Secondary | ICD-10-CM

## 2019-06-15 DIAGNOSIS — E1165 Type 2 diabetes mellitus with hyperglycemia: Secondary | ICD-10-CM | POA: Diagnosis present

## 2019-06-15 DIAGNOSIS — Z79899 Other long term (current) drug therapy: Secondary | ICD-10-CM

## 2019-06-15 DIAGNOSIS — R29701 NIHSS score 1: Secondary | ICD-10-CM | POA: Diagnosis present

## 2019-06-15 DIAGNOSIS — Z8249 Family history of ischemic heart disease and other diseases of the circulatory system: Secondary | ICD-10-CM

## 2019-06-15 DIAGNOSIS — E119 Type 2 diabetes mellitus without complications: Secondary | ICD-10-CM

## 2019-06-15 DIAGNOSIS — R269 Unspecified abnormalities of gait and mobility: Secondary | ICD-10-CM

## 2019-06-15 DIAGNOSIS — E876 Hypokalemia: Secondary | ICD-10-CM | POA: Diagnosis present

## 2019-06-15 DIAGNOSIS — I160 Hypertensive urgency: Secondary | ICD-10-CM | POA: Diagnosis present

## 2019-06-15 DIAGNOSIS — F329 Major depressive disorder, single episode, unspecified: Secondary | ICD-10-CM | POA: Diagnosis present

## 2019-06-15 DIAGNOSIS — G9349 Other encephalopathy: Secondary | ICD-10-CM | POA: Diagnosis present

## 2019-06-15 DIAGNOSIS — Z6828 Body mass index (BMI) 28.0-28.9, adult: Secondary | ICD-10-CM

## 2019-06-15 DIAGNOSIS — F17210 Nicotine dependence, cigarettes, uncomplicated: Secondary | ICD-10-CM | POA: Diagnosis present

## 2019-06-15 DIAGNOSIS — E1169 Type 2 diabetes mellitus with other specified complication: Secondary | ICD-10-CM

## 2019-06-15 DIAGNOSIS — M542 Cervicalgia: Secondary | ICD-10-CM | POA: Diagnosis present

## 2019-06-15 DIAGNOSIS — Z7984 Long term (current) use of oral hypoglycemic drugs: Secondary | ICD-10-CM

## 2019-06-15 DIAGNOSIS — G8929 Other chronic pain: Secondary | ICD-10-CM | POA: Diagnosis present

## 2019-06-15 DIAGNOSIS — I6340 Cerebral infarction due to embolism of unspecified cerebral artery: Principal | ICD-10-CM | POA: Diagnosis present

## 2019-06-15 DIAGNOSIS — Z833 Family history of diabetes mellitus: Secondary | ICD-10-CM

## 2019-06-15 DIAGNOSIS — Z8673 Personal history of transient ischemic attack (TIA), and cerebral infarction without residual deficits: Secondary | ICD-10-CM

## 2019-06-15 DIAGNOSIS — Z20822 Contact with and (suspected) exposure to covid-19: Secondary | ICD-10-CM | POA: Diagnosis present

## 2019-06-15 HISTORY — DX: Tobacco use: Z72.0

## 2019-06-15 LAB — DIFFERENTIAL
Abs Immature Granulocytes: 0.03 10*3/uL (ref 0.00–0.07)
Basophils Absolute: 0 10*3/uL (ref 0.0–0.1)
Basophils Relative: 1 %
Eosinophils Absolute: 0.1 10*3/uL (ref 0.0–0.5)
Eosinophils Relative: 1 %
Immature Granulocytes: 0 %
Lymphocytes Relative: 21 %
Lymphs Abs: 1.6 10*3/uL (ref 0.7–4.0)
Monocytes Absolute: 0.5 10*3/uL (ref 0.1–1.0)
Monocytes Relative: 7 %
Neutro Abs: 5.3 10*3/uL (ref 1.7–7.7)
Neutrophils Relative %: 70 %

## 2019-06-15 LAB — CBC
HCT: 36.8 % (ref 36.0–46.0)
Hemoglobin: 12.7 g/dL (ref 12.0–15.0)
MCH: 31 pg (ref 26.0–34.0)
MCHC: 34.5 g/dL (ref 30.0–36.0)
MCV: 89.8 fL (ref 80.0–100.0)
Platelets: 231 10*3/uL (ref 150–400)
RBC: 4.1 MIL/uL (ref 3.87–5.11)
RDW: 12.2 % (ref 11.5–15.5)
WBC: 7.5 10*3/uL (ref 4.0–10.5)
nRBC: 0 % (ref 0.0–0.2)

## 2019-06-15 LAB — COMPREHENSIVE METABOLIC PANEL
ALT: 11 U/L (ref 0–44)
AST: 12 U/L — ABNORMAL LOW (ref 15–41)
Albumin: 3.2 g/dL — ABNORMAL LOW (ref 3.5–5.0)
Alkaline Phosphatase: 83 U/L (ref 38–126)
Anion gap: 12 (ref 5–15)
BUN: 12 mg/dL (ref 8–23)
CO2: 27 mmol/L (ref 22–32)
Calcium: 9.1 mg/dL (ref 8.9–10.3)
Chloride: 98 mmol/L (ref 98–111)
Creatinine, Ser: 0.69 mg/dL (ref 0.44–1.00)
GFR calc Af Amer: >60 mL/min (ref >60)
GFR calc non Af Amer: >60 mL/min (ref >60)
Glucose, Bld: 312 mg/dL — ABNORMAL HIGH (ref 70–99)
Potassium: 3.1 mmol/L — ABNORMAL LOW (ref 3.5–5.1)
Sodium: 137 mmol/L (ref 135–145)
Total Bilirubin: 0.8 mg/dL (ref 0.3–1.2)
Total Protein: 6.4 g/dL — ABNORMAL LOW (ref 6.5–8.1)

## 2019-06-15 LAB — I-STAT CHEM 8, ED
BUN: 11 mg/dL (ref 8–23)
Calcium, Ion: 1.13 mmol/L — ABNORMAL LOW (ref 1.15–1.40)
Chloride: 98 mmol/L (ref 98–111)
Creatinine, Ser: 0.6 mg/dL (ref 0.44–1.00)
Glucose, Bld: 312 mg/dL — ABNORMAL HIGH (ref 70–99)
HCT: 33 % — ABNORMAL LOW (ref 36.0–46.0)
Hemoglobin: 11.2 g/dL — ABNORMAL LOW (ref 12.0–15.0)
Potassium: 3 mmol/L — ABNORMAL LOW (ref 3.5–5.1)
Sodium: 134 mmol/L — ABNORMAL LOW (ref 135–145)
TCO2: 32 mmol/L (ref 22–32)

## 2019-06-15 LAB — TROPONIN I (HIGH SENSITIVITY): Troponin I (High Sensitivity): 29 ng/L — ABNORMAL HIGH (ref <18)

## 2019-06-15 LAB — APTT: aPTT: 30 seconds (ref 24–36)

## 2019-06-15 LAB — PROTIME-INR
INR: 1 (ref 0.8–1.2)
Prothrombin Time: 12.4 seconds (ref 11.4–15.2)

## 2019-06-15 LAB — CBG MONITORING, ED: Glucose-Capillary: 299 mg/dL — ABNORMAL HIGH (ref 70–99)

## 2019-06-15 IMAGING — CT CT HEAD W/O CM
3 series · 15 of 47 positions shown, 18 images · non-contrast
Comparison: [DATE]

CLINICAL DATA: Altered mental status.

EXAM:
CT HEAD WITHOUT CONTRAST
TECHNIQUE: Contiguous axial images were obtained from the base of the skull
through the vertex without intravenous contrast.

[Series 3: head 5.0 h30s · axial · 0.41mm/px · z∈[-34,+91]mm · 9 of 31 slices shown, 12 images]
[im 3/31  brain]
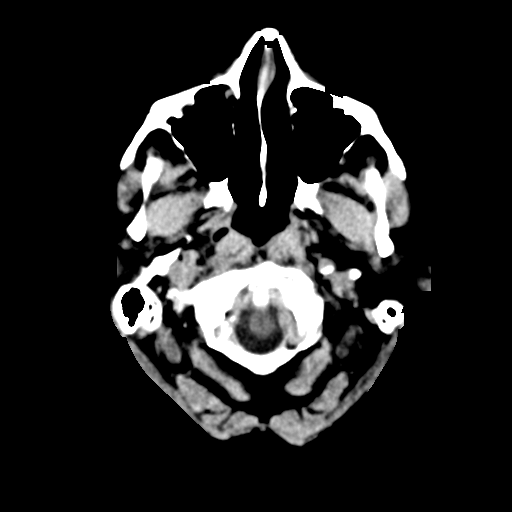
[im 3/31  bone]
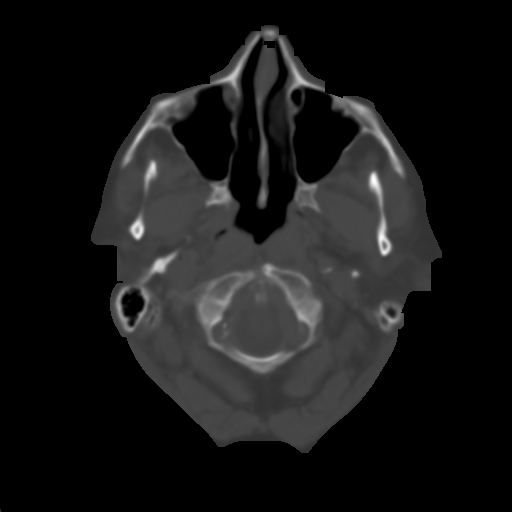
[im 6/31  brain]
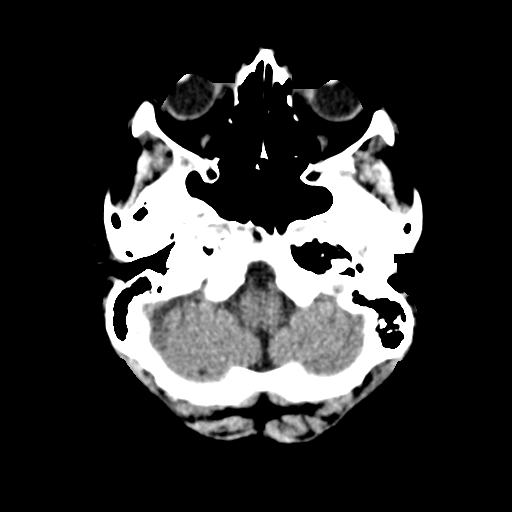
[im 9/31  brain]
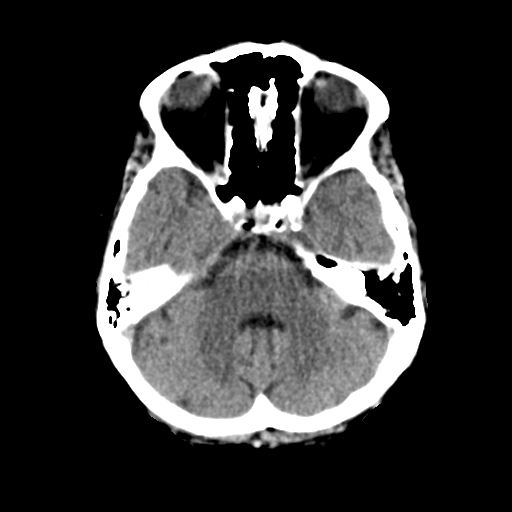
[im 12/31  brain]
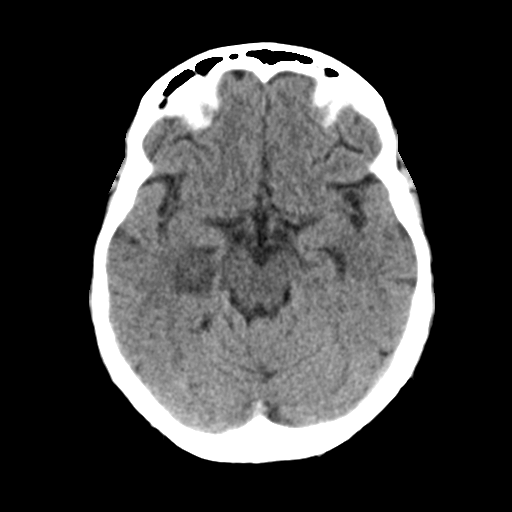
[im 16/31  brain]
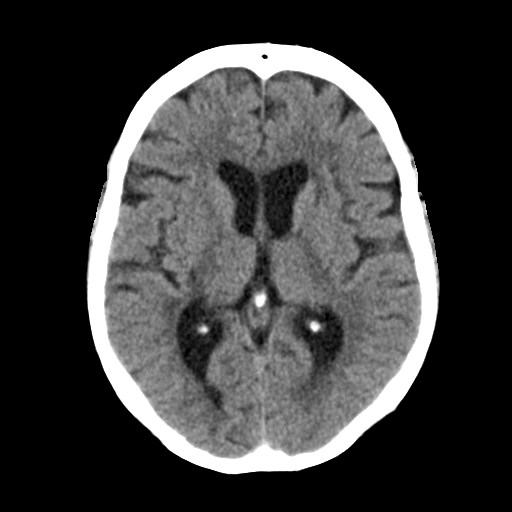
[im 16/31  bone]
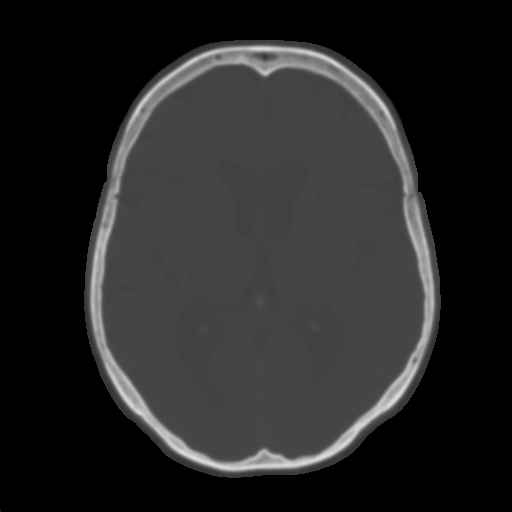
[im 19/31  brain]
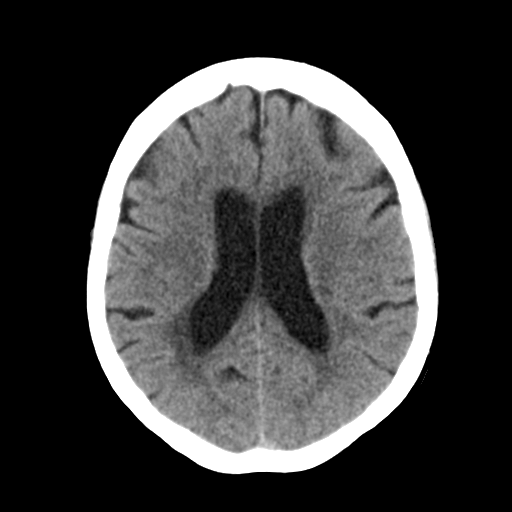
[im 22/31  brain]
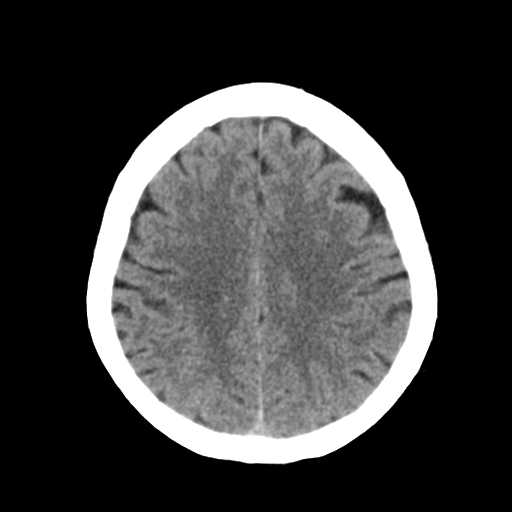
[im 25/31  brain]
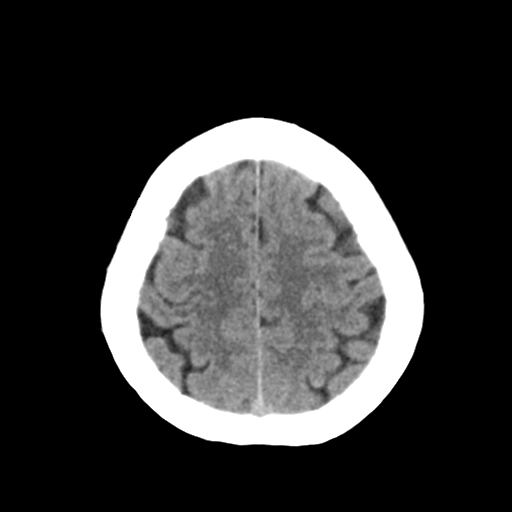
[im 28/31  brain]
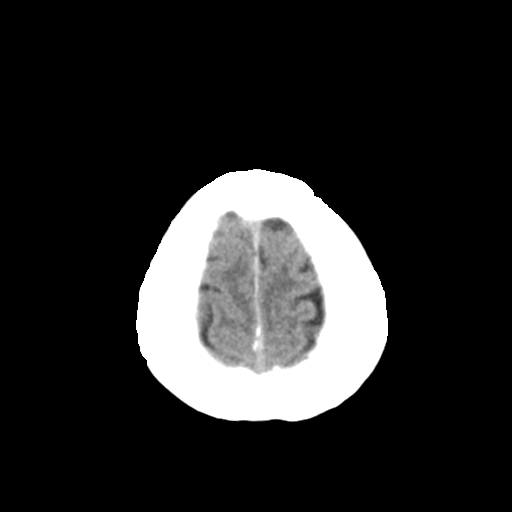
[im 28/31  bone]
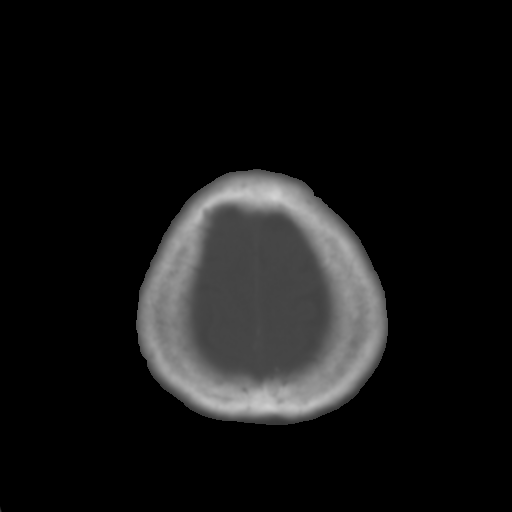

[Series 5: head 3.0 mpr cor · coronal · 0.33mm/px · 3 of 67 slices shown]
[im 23/67  brain]
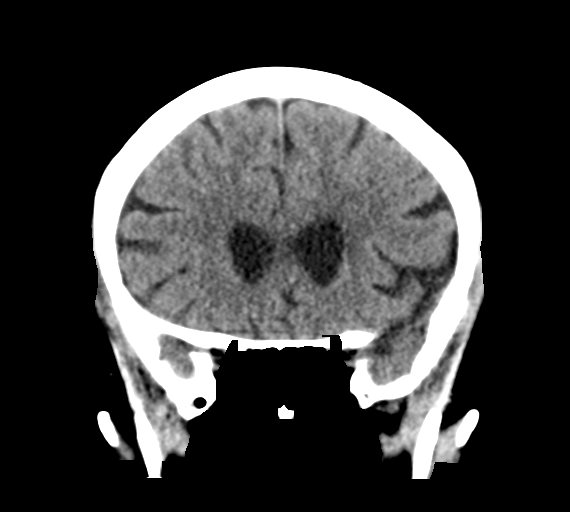
[im 30/67  brain]
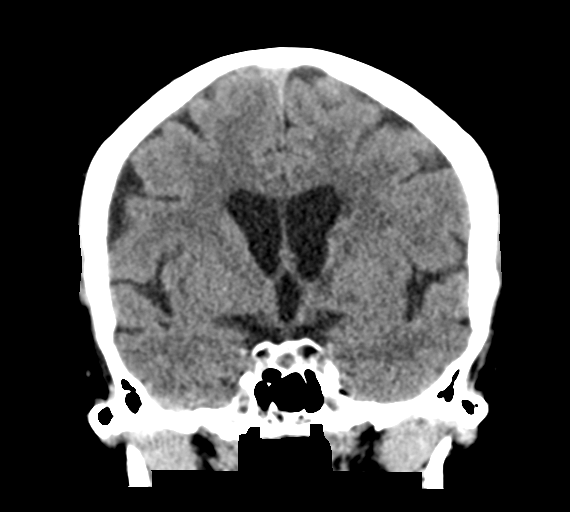
[im 37/67  brain]
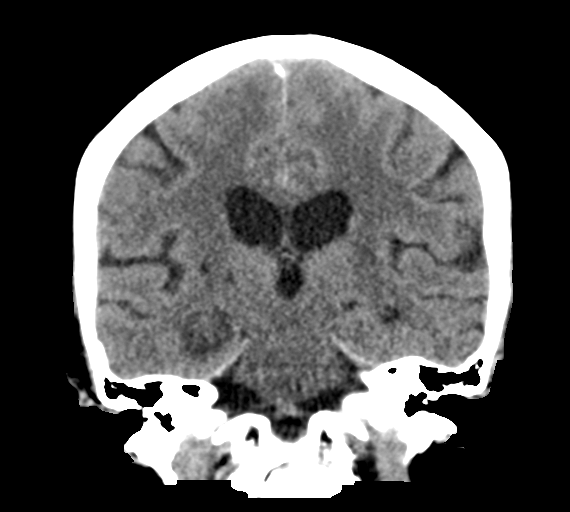

[Series 6: head 3.0 mpr sag · sagittal · 0.32mm/px · 3 of 63 slices shown]
[im 21/63  brain]
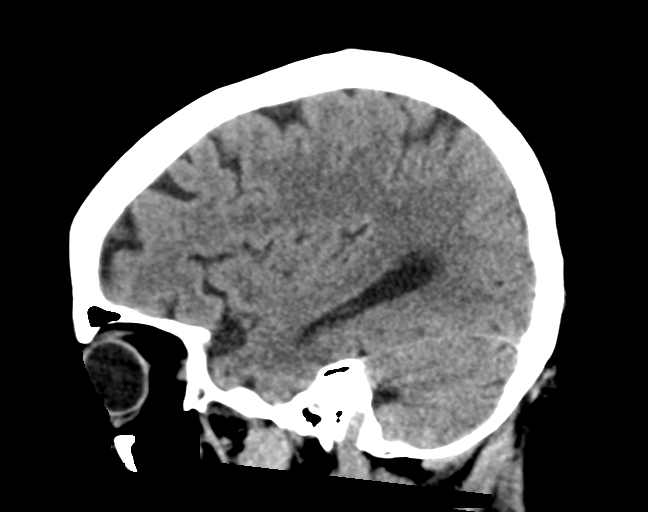
[im 32/63  brain]
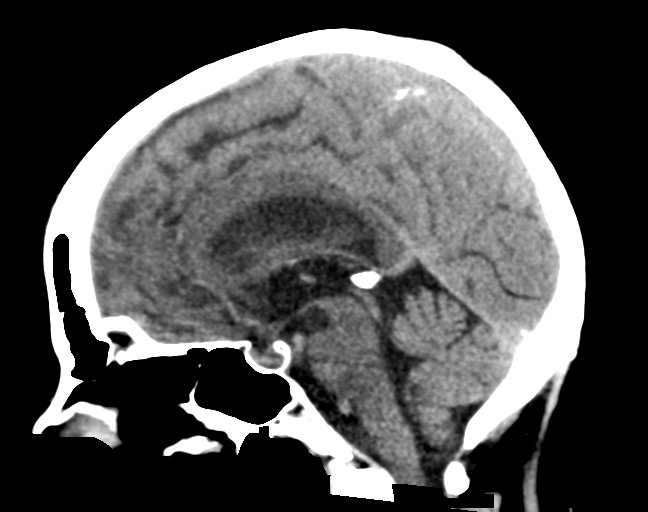
[im 42/63  brain]
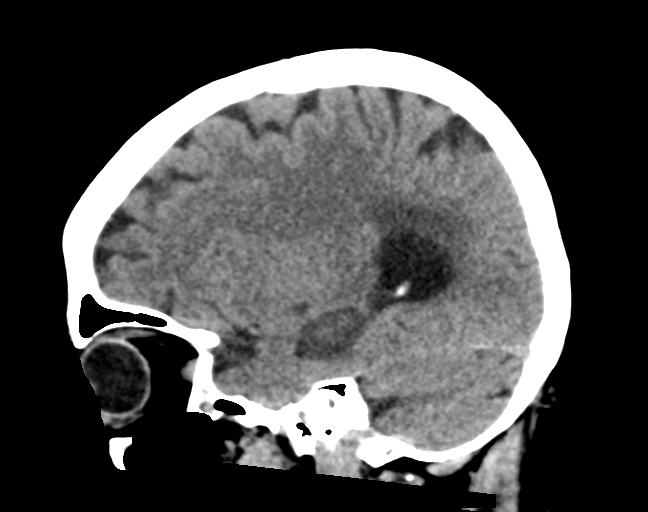

[15 of 47 positions shown; findings below may reference images not displayed]

FINDINGS: Brain: There is mild cerebral atrophy with widening of the
extra-axial spaces and ventricular dilatation.
There are areas of decreased attenuation within the white matter
tracts of the supratentorial brain, consistent with microvascular
disease changes.

A 2.0 cm x 1.7 cm area of white matter low attenuation is seen
within the posteromedial aspect of the right temporal lobe. This
represents a new finding when compared to the prior study. There is
no definite evidence of associated mass effect or midline shift.

Chronic bilateral basal ganglia lacunar infarcts are seen.

Vascular: No hyperdense vessel or unexpected calcification.

Skull: Normal. Negative for fracture or focal lesion.

Sinuses/Orbits: No acute finding.

Other: None.
IMPRESSION: 1. 2.0 cm x 1.7 cm area of white matter low attenuation within the
posteromedial aspect of the right temporal lobe which represents a
new finding when compared to the prior study. This may represent an
acute or subacute infarct. MRI correlation is recommended.
2. Chronic bilateral basal ganglia lacunar infarcts.

## 2019-06-15 MED ORDER — LORAZEPAM 2 MG/ML IJ SOLN
1.0000 mg | Freq: Once | INTRAMUSCULAR | Status: AC
  Administered 2019-06-15: 1 mg via INTRAVENOUS
  Filled 2019-06-15: qty 1

## 2019-06-15 MED ORDER — SODIUM CHLORIDE 0.9% FLUSH
3.0000 mL | Freq: Once | INTRAVENOUS | Status: AC
  Administered 2019-06-15: 3 mL via INTRAVENOUS

## 2019-06-15 NOTE — ED Provider Notes (Signed)
[facility] EMERGENCY DEPARTMENT Provider Note   CSN: [id number] Arrival date & time: 06/15/19  2046     History Chief Complaint  Patient presents with  . Blurred Vision    Carrie King is a 64 y.o. female.  64 y.o female with a PMH of Stroke, DM, HTN presents to the ED with a chief complaint of mental status change x 1 week. Daughter at the bedside most of the history, reports patient has had a stroke in the past, symptoms are somewhat similar.  She reports patient has now began shoveling instead of walking, she is unable to pick up her feet from the ground.  She reports there is a constant repetitive questioning occurring.  States patient has not been herself for the past week.  Patient states "I do not feel right, I go down the road and turned however then I feel like I did not turn on this road ".  He also endorses bilateral leg weakness, states I am unable to pick up my feet from the ground and ambulate properly.  There has been no associated headaches, dizziness, trauma.  She is currently compliant with her medication, no chest pain or shortness of breath, currently not on any blood thinners.   The history is provided by the patient and medical records.       Past Medical History:  Diagnosis Date  . Diabetes mellitus without complication (HCC)   . Hypertension   . Stroke (HCC)   . Uncontrolled diabetes mellitus (HCC) 05/16/2018    Patient Active Problem List   Diagnosis Date Noted  . AMS (altered mental status) 06/16/2019  . Anxiety and depression 06/24/2018  . 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 Acute CVA (cerebrovascular accident) (HCC) 05/31/2018  . Hypertension 05/31/2018  . Rupture of left triceps tendon 05/20/2018  . Medial malleolar fracture 05/20/2018  . Diabetes mellitus (HCC) 05/16/2018  . Open bicondylar fracture of distal humerus, left, initial encounter 05/14/2018  . Left elbow fracture 05/14/2018  . Open Monteggia's fracture  of left ulna, type IIIA, IIIB, or IIIC 05/14/2018    Past Surgical History:  Procedure Laterality Date  . EXTERNAL FIXATION ARM Left 05/14/2018   Procedure: IRRIGATION AND DEBRIDEMENT, EXTERNAL FIXATION LEFT ELBOW FRACTURE;  Surgeon: Roby Lofts, MD;  Location: [facility] OR;  Service: Orthopedics;  Laterality: Left;  . I & D EXTREMITY Left 05/18/2018   Procedure: IRRIGATION AND DEBRIDEMENT EXTREMITY;  Surgeon: Roby Lofts, MD;  Location: [facility] OR;  Service: Orthopedics;  Laterality: Left;  . LOOP RECORDER INSERTION N/A 06/01/2018   Procedure: LOOP RECORDER INSERTION;  Surgeon: Marinus Maw, MD;  Location: [facility] INVASIVE CV LAB;  Service: Cardiovascular;  Laterality: N/A;  . ORIF HUMERUS FRACTURE Left 05/18/2018   Procedure: OPEN REDUCTION INTERNAL FIXATION (ORIF) DISTAL HUMERUS FRACTURE;  Surgeon: Roby Lofts, MD;  Location: [facility] OR;  Service: Orthopedics;  Laterality: Left;  . ORIF ULNAR FRACTURE Left 05/18/2018   Procedure: OPEN REDUCTION INTERNAL FIXATION (ORIF) ULNAR FRACTURE;  Surgeon: Roby Lofts, MD;  Location: [facility] OR;  Service: Orthopedics;  Laterality: Left;     OB History   No obstetric history on file.     No family history on file.  Social History   Tobacco Use  . Smoking status: Former Smoker    Packs/day: 0.50    Types: Cigarettes  . Smokeless tobacco: Former Neurosurgeon    Quit date: 05/14/2018  Substance Use Topics  . Alcohol use: Never  .  Drug use: Yes    Types: Marijuana    Home Medications Prior to Admission medications   Medication Sig Start Date End Date Taking? Authorizing Provider  acetaminophen (TYLENOL) 325 MG tablet Take 2 tablets (650 mg total) by mouth every 6 (six) hours as needed for mild pain (or Fever >/= 101). 06/11/18  Yes Angiulli, Mcarthur Rossettianiel J, PA-C  aspirin EC 81 MG EC tablet Take 1 tablet (81 mg total) by mouth daily. 06/02/18   Ali LoweVogel, Marie S, MD  atorvastatin (LIPITOR) 40 MG tablet Take 1 tablet (40 mg total) by mouth daily at 6 PM. 10/07/18   Hoy RegisterNewlin,  Enobong, MD  Blood Glucose Monitoring Suppl (TRUE METRIX METER) DEVI 1 each by Does not apply route 3 (three) times daily before meals. 10/07/18   Hoy RegisterNewlin, Enobong, MD  gabapentin (NEURONTIN) 100 MG capsule Take 1 capsule (100 mg total) by mouth 3 (three) times daily. 10/07/18   Hoy RegisterNewlin, Enobong, MD  glimepiride (AMARYL) 1 MG tablet Take 1 tablet (1 mg total) by mouth daily with breakfast. 10/07/18   Hoy RegisterNewlin, Enobong, MD  glucose blood (TRUE METRIX BLOOD GLUCOSE TEST) test strip Use 3 times daily before meals 10/07/18   Hoy RegisterNewlin, Enobong, MD  lisinopril (ZESTRIL) 10 MG tablet Take 1 tablet (10 mg total) by mouth daily. 10/07/18   Hoy RegisterNewlin, Enobong, MD  traMADol (ULTRAM) 50 MG tablet Take 1 tablet (50 mg total) by mouth 2 (two) times daily. 08/06/18   Hoy RegisterNewlin, Enobong, MD  TRUEplus Lancets 28G MISC 1 each by Does not apply route 3 (three) times daily before meals. 10/07/18   Hoy RegisterNewlin, Enobong, MD  venlafaxine XR (EFFEXOR-XR) 75 MG 24 hr capsule Take 1 capsule (75 mg total) by mouth daily. 10/07/18   Hoy RegisterNewlin, Enobong, MD    Allergies    Patient has no known allergies.  Review of Systems   Review of Systems  Constitutional: Negative for fever.  HENT: Negative for rhinorrhea and sore throat.   Respiratory: Negative for shortness of breath.   Cardiovascular: Negative for chest pain.  Gastrointestinal: Negative for abdominal pain, nausea and vomiting.  Genitourinary: Negative for flank pain.  Musculoskeletal: Positive for gait problem. Negative for back pain.  Skin: Negative for pallor and wound.  Neurological: Positive for weakness. Negative for tremors, syncope, facial asymmetry, speech difficulty, light-headedness, numbness and headaches.  All other systems reviewed and are negative.   Physical Exam Updated Vital Signs BP (!) 149/70   Pulse 77   Temp 98.6 F (37 C)   Resp 15   Ht 5\' 4"  (1.626 m)   Wt 75.4 kg   SpO2 94%   BMI 28.53 kg/m   Physical Exam Vitals and nursing note reviewed.  Constitutional:        Appearance: She is normal weight.  HENT:     Head: Normocephalic and atraumatic.     Comments: No visible facial trauma noted.    Nose: Nose normal.     Mouth/Throat:     Mouth: Mucous membranes are moist.  Eyes:     Pupils: Pupils are equal, round, and reactive to light.  Cardiovascular:     Rate and Rhythm: Normal rate.     Heart sounds: No murmur.  Pulmonary:     Effort: Pulmonary effort is normal.     Breath sounds: No wheezing or rales.  Abdominal:     General: Abdomen is flat.     Palpations: Abdomen is soft.     Tenderness: There is no abdominal tenderness. There is  no right CVA tenderness or left CVA tenderness.  Musculoskeletal:        General: No signs of injury.     Cervical back: Normal range of motion and neck supple.  Skin:    General: Skin is warm and dry.  Neurological:     Mental Status: She is alert and oriented to person, place, and time.     Comments: Alert, oriented, thought content appropriate. Speech fluent without evidence of aphasia. Able to follow 2 step commands without difficulty.  Cranial Nerves:  II:  Peripheral visual fields grossly normal, pupils, round, reactive to light III,IV, VI: ptosis not present, extra-ocular motions intact bilaterally  V,VII: smile symmetric, facial light touch sensation equal VIII: hearing grossly normal bilaterally  IX,X: midline uvula rise  XI: bilateral shoulder shrug equal and strong XII: midline tongue extension  Motor:  5/5 in upper and lower extremities bilaterally including strong and equal grip strength and dorsiflexion/plantar flexion Sensory: light touch normal in all extremities.  Cerebellar: normal finger-to-nose with bilateral upper extremities, pronator drift negative       ED Results / Procedures / Treatments   Labs (all labs ordered are listed, but only abnormal results are displayed) Labs Reviewed  COMPREHENSIVE METABOLIC PANEL - Abnormal; Notable for the following components:      Result  Value   Potassium 3.1 (*)    Glucose, Bld 312 (*)    Total Protein 6.4 (*)    Albumin 3.2 (*)    AST 12 (*)    All other components within normal limits  RAPID URINE DRUG SCREEN, HOSP PERFORMED - Abnormal; Notable for the following components:   Tetrahydrocannabinol POSITIVE (*)    All other components within normal limits  AMMONIA - Abnormal; Notable for the following components:   Ammonia <9 (*)    All other components within normal limits  I-STAT CHEM 8, ED - Abnormal; Notable for the following components:   Sodium 134 (*)    Potassium 3.0 (*)    Glucose, Bld 312 (*)    Calcium, Ion 1.13 (*)    Hemoglobin 11.2 (*)    HCT 33.0 (*)    All other components within normal limits  CBG MONITORING, ED - Abnormal; Notable for the following components:   Glucose-Capillary 299 (*)    All other components within normal limits  TROPONIN I (HIGH SENSITIVITY) - Abnormal; Notable for the following components:   Troponin I (High Sensitivity) 29 (*)    All other components within normal limits  TROPONIN I (HIGH SENSITIVITY) - Abnormal; Notable for the following components:   Troponin I (High Sensitivity) 27 (*)    All other components within normal limits  CSF CULTURE  CSF CULTURE  PROTIME-INR  APTT  CBC  DIFFERENTIAL  PROTEIN AND GLUCOSE, CSF  HERPES SIMPLEX VIRUS(HSV) DNA BY PCR  CSF CELL COUNT WITH DIFFERENTIAL    EKG EKG Interpretation  Date/Time:  Tuesday Jun 15 2019 20:54:18 EDT Ventricular Rate:  93 PR Interval:  204 QRS Duration: 92 QT Interval:  370 QTC Calculation: 460 R Axis:   -29 Text Interpretation: Normal sinus rhythm Left ventricular hypertrophy with repolarization abnormality ( R in aVL , Cornell product ) Abnormal ECG increased ischemic changes comapred with prior 5/20 Confirmed by Meridee Score [phone]) on 06/15/2019 9:44:34 PM   Radiology CT HEAD WO CONTRAST  Result Date: 06/15/2019 CLINICAL DATA:  Altered mental status. EXAM: CT HEAD WITHOUT CONTRAST  TECHNIQUE: Contiguous axial images were obtained from the base of  the skull through the vertex without intravenous contrast. COMPARISON:  May 31, 2018 FINDINGS: Brain: There is mild cerebral atrophy with widening of the extra-axial spaces and ventricular dilatation. There are areas of decreased attenuation within the white matter tracts of the supratentorial brain, consistent with microvascular disease changes. A 2.0 cm x 1.7 cm area of white matter low attenuation is seen within the posteromedial aspect of the right temporal lobe. This represents a new finding when compared to the prior study. There is no definite evidence of associated mass effect or midline shift. Chronic bilateral basal ganglia lacunar infarcts are seen. Vascular: No hyperdense vessel or unexpected calcification. Skull: Normal. Negative for fracture or focal lesion. Sinuses/Orbits: No acute finding. Other: None. IMPRESSION: 1. 2.0 cm x 1.7 cm area of white matter low attenuation within the posteromedial aspect of the right temporal lobe which represents a new finding when compared to the prior study. This may represent an acute or subacute infarct. MRI correlation is recommended. 2. Chronic bilateral basal ganglia lacunar infarcts. Electronically Signed   By: Aram Candela M.D.   On: 06/15/2019 21:31   MR Brain W and Wo Contrast  Result Date: 06/16/2019 CLINICAL DATA:  Subacute neuro deficit. "Not thinking clearly" and blurred vision for 5 days EXAM: MRI HEAD WITHOUT AND WITH CONTRAST TECHNIQUE: Multiplanar, multiecho pulse sequences of the brain and surrounding structures were obtained without and with intravenous contrast. CONTRAST:  7.26mL GADAVIST GADOBUTROL 1 MMOL/ML IV SOLN COMPARISON:  05/31/2018 FINDINGS: Brain: 18 mm area of restricted diffusion in the medial right temporal lobe with petechial blood products superimposed. Focality, history, and presence of a right thalamic lacune is most consistent with infarct rather than an  primary inflammatory process. Acute lacunar infarcts are also seen in the left pons, and left caudate body. Subacute lacunar infarct seen in the left periatrial white matter. There is a subcentimeter focus of enhancement along the parasagittal and anterior left frontal cortex, suspect this is a subacute infarct. Background of chronic small vessel ischemic injury especially seen in the pons and periventricular white matter. There have been other lacunar infarcts. No masslike finding, hydrocephalus, or extra-axial collection. Brain volume is overall normal. Vascular: Normal flow voids and vessel enhancement Skull and upper cervical spine: Normal marrow signal Sinuses/Orbits: Negative IMPRESSION: 1. Acute infarct in the medial right temporal lobe, at the hippocampus, with petechial hemorrhage. Acute and subacute lacunar infarcts also seen in the left pons, right thalamus, left caudate body, and left periatrial white matter. Multiple acute lacunar infarcts is somewhat unusual, no abnormal enhancement to suggest an underlying leptomeningeal inflammatory process. 2. Small focus of cortical enhancement along the parasagittal left frontal lobe, favor subacute infarct given the constellation of findings. If malignancy history, would obtain a 2 month follow-up to ensure resolution. 3. Background small vessel disease. Electronically Signed   By: Marnee Spring M.D.   On: 06/16/2019 04:14    Procedures Procedures (including critical care time)  Medications Ordered in ED Medications  acyclovir (ZOVIRAX) 750 mg in dextrose 5 % 150 mL IVPB (750 mg Intravenous New Bag/Given 06/16/19 0558)  sodium chloride flush (NS) 0.9 % injection 3 mL (3 mLs Intravenous Given 06/15/19 2235)  LORazepam (ATIVAN) injection 1 mg (1 mg Intravenous Given 06/15/19 2311)  gadobutrol (GADAVIST) 1 MMOL/ML injection 7.5 mL (7.5 mLs Intravenous Contrast Given 06/16/19 0313)    ED Course  I have reviewed the triage vital signs and the nursing  notes.  Pertinent labs & imaging results that were available during  my care of the patient were reviewed by me and considered in my medical decision making (see chart for details).  Clinical Course as of Jun 15 617  Wed Jun 16, 2019  0010 Tetrahydrocannabinol(!): POSITIVE [JS]  1610 Potassium(!): 3.0 [JS]    Clinical Course User Index [JS] Claude Manges, PA-C   MDM Rules/Calculators/A&P  Patient with a past medical history of stroke, hypertension, diabetes presents to the ED with complaints of changes in mental status.  Daughter is providing most of the history, reports patient has not been ambulating as usual, has had changes in gait and now only shuffles, patient states she is unable to pick up her feet.  During evaluation she is neuro with no facial asymmetry, or dysarthria.  Bilateral upper extremities have full strength.  Lower extremities with equal strength, gait was not assessed as patient was lying on bed.  Triage order was placed for CT imaging showed:  1. 2.0 cm x 1.7 cm area of white matter low attenuation within the  posteromedial aspect of the right temporal lobe which represents a  new finding when compared to the prior study. This may represent an  acute or subacute infarct. MRI correlation is recommended.  2. Chronic bilateral basal ganglia lacunar infarcts.     Interpretation of her labs with a CMP with some mild hypokalemia, creatinine level is within normal limits.  CBC is unremarkable.  PT and INR within normal limits.  UDS is positive for THC.  Troponin was 29, repeat was 27, denies any chest pain on today's visit or any shortness of breath.  Of note, patient does have a loop recorder, this was interrogated prior to MRI without any new changes noted.  1:01 AM Patient was unable to get MRI as they noted that she had "a pacemaker ".  I have asked this to family member who reports that she does not have a pacemaker, this early placed in her heart in order to monitor any  acute changes.  Spoke to Dr. Otelia Limes of neurology who recommended an MRI brain with and without.  MRI of the brain showed:  1. Acute infarct in the medial right temporal lobe, at the  hippocampus, with petechial hemorrhage. Acute and subacute lacunar  infarcts also seen in the left pons, right thalamus, left caudate  body, and left periatrial white matter. Multiple acute lacunar  infarcts is somewhat unusual, no abnormal enhancement to suggest an  underlying leptomeningeal inflammatory process.  2. Small focus of cortical enhancement along the parasagittal left  frontal lobe, favor subacute infarct given the constellation of  findings. If malignancy history, would obtain a 2 month follow-up to  ensure resolution.  3. Background small vessel disease.     Consultation placed for Dr. Otelia Limes of neurology for further recommendation.   4:43 AM Spoke to Dr. 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 Will need empirically acyclovir per pharmacy ordered.   Lumbar puncture performed by Dr. Baxter Hire Ward, I myself was present at the bedside.  Tolerated the procedure without any complications, she was placed flat on the bed after procedure.   6:18 AM spoke to Dr. Loney Loh or hospitalist who will admit patient for further management.   Portions of this note were generated with Scientist, clinical (histocompatibility and immunogenetics). Dictation errors may occur despite best attempts at proofreading.  Final Clinical Impression(s) / ED Diagnoses Final diagnoses:  Gait abnormality    Rx / DC Orders ED Discharge Orders    None  Janeece Fitting, PA-C 06/16/19 [id number]    Dorie Rank, MD 06/16/19 [phone]

## 2019-06-15 NOTE — ED Triage Notes (Signed)
Pt arrives to ED w/ c/o "not thinking clearly" and blurred vision x 5 days. NIHSS 0. AOx4, neuro intact.

## 2019-06-16 ENCOUNTER — Inpatient Hospital Stay (HOSPITAL_COMMUNITY): Payer: Medicaid Other

## 2019-06-16 ENCOUNTER — Encounter (HOSPITAL_COMMUNITY): Payer: Self-pay | Admitting: Internal Medicine

## 2019-06-16 ENCOUNTER — Other Ambulatory Visit (HOSPITAL_COMMUNITY): Payer: Medicaid Other

## 2019-06-16 ENCOUNTER — Emergency Department (HOSPITAL_COMMUNITY): Payer: Medicaid Other

## 2019-06-16 DIAGNOSIS — Z20822 Contact with and (suspected) exposure to covid-19: Secondary | ICD-10-CM | POA: Diagnosis present

## 2019-06-16 DIAGNOSIS — I6340 Cerebral infarction due to embolism of unspecified cerebral artery: Secondary | ICD-10-CM | POA: Diagnosis present

## 2019-06-16 DIAGNOSIS — I639 Cerebral infarction, unspecified: Secondary | ICD-10-CM

## 2019-06-16 DIAGNOSIS — R778 Other specified abnormalities of plasma proteins: Secondary | ICD-10-CM | POA: Diagnosis present

## 2019-06-16 DIAGNOSIS — R269 Unspecified abnormalities of gait and mobility: Secondary | ICD-10-CM | POA: Diagnosis present

## 2019-06-16 DIAGNOSIS — F419 Anxiety disorder, unspecified: Secondary | ICD-10-CM

## 2019-06-16 DIAGNOSIS — G9341 Metabolic encephalopathy: Secondary | ICD-10-CM | POA: Diagnosis not present

## 2019-06-16 DIAGNOSIS — R29701 NIHSS score 1: Secondary | ICD-10-CM | POA: Diagnosis present

## 2019-06-16 DIAGNOSIS — Z833 Family history of diabetes mellitus: Secondary | ICD-10-CM | POA: Diagnosis not present

## 2019-06-16 DIAGNOSIS — E876 Hypokalemia: Secondary | ICD-10-CM | POA: Diagnosis present

## 2019-06-16 DIAGNOSIS — Z79899 Other long term (current) drug therapy: Secondary | ICD-10-CM | POA: Diagnosis not present

## 2019-06-16 DIAGNOSIS — F329 Major depressive disorder, single episode, unspecified: Secondary | ICD-10-CM | POA: Diagnosis present

## 2019-06-16 DIAGNOSIS — Z9114 Patient's other noncompliance with medication regimen: Secondary | ICD-10-CM | POA: Diagnosis not present

## 2019-06-16 DIAGNOSIS — Z8673 Personal history of transient ischemic attack (TIA), and cerebral infarction without residual deficits: Secondary | ICD-10-CM

## 2019-06-16 DIAGNOSIS — G8929 Other chronic pain: Secondary | ICD-10-CM | POA: Diagnosis present

## 2019-06-16 DIAGNOSIS — G9349 Other encephalopathy: Secondary | ICD-10-CM | POA: Diagnosis present

## 2019-06-16 DIAGNOSIS — E1169 Type 2 diabetes mellitus with other specified complication: Secondary | ICD-10-CM

## 2019-06-16 DIAGNOSIS — Z7982 Long term (current) use of aspirin: Secondary | ICD-10-CM | POA: Diagnosis not present

## 2019-06-16 DIAGNOSIS — E785 Hyperlipidemia, unspecified: Secondary | ICD-10-CM | POA: Diagnosis present

## 2019-06-16 DIAGNOSIS — M542 Cervicalgia: Secondary | ICD-10-CM | POA: Diagnosis present

## 2019-06-16 DIAGNOSIS — E1165 Type 2 diabetes mellitus with hyperglycemia: Secondary | ICD-10-CM | POA: Diagnosis present

## 2019-06-16 DIAGNOSIS — I160 Hypertensive urgency: Secondary | ICD-10-CM | POA: Diagnosis present

## 2019-06-16 DIAGNOSIS — I10 Essential (primary) hypertension: Secondary | ICD-10-CM

## 2019-06-16 DIAGNOSIS — M79602 Pain in left arm: Secondary | ICD-10-CM | POA: Diagnosis present

## 2019-06-16 DIAGNOSIS — Z6828 Body mass index (BMI) 28.0-28.9, adult: Secondary | ICD-10-CM | POA: Diagnosis not present

## 2019-06-16 DIAGNOSIS — I6389 Other cerebral infarction: Secondary | ICD-10-CM | POA: Diagnosis not present

## 2019-06-16 DIAGNOSIS — E663 Overweight: Secondary | ICD-10-CM | POA: Diagnosis present

## 2019-06-16 DIAGNOSIS — F17210 Nicotine dependence, cigarettes, uncomplicated: Secondary | ICD-10-CM | POA: Diagnosis present

## 2019-06-16 DIAGNOSIS — Z7984 Long term (current) use of oral hypoglycemic drugs: Secondary | ICD-10-CM | POA: Diagnosis not present

## 2019-06-16 LAB — PROTEIN AND GLUCOSE, CSF
Glucose, CSF: 153 mg/dL — ABNORMAL HIGH (ref 40–70)
Total  Protein, CSF: 40 mg/dL (ref 15–45)

## 2019-06-16 LAB — TROPONIN I (HIGH SENSITIVITY): Troponin I (High Sensitivity): 27 ng/L — ABNORMAL HIGH (ref <18)

## 2019-06-16 LAB — CSF CELL COUNT WITH DIFFERENTIAL
RBC Count, CSF: 29 /mm3 — ABNORMAL HIGH
Tube #: 3
WBC, CSF: 2 /mm3 (ref 0–5)

## 2019-06-16 LAB — RAPID URINE DRUG SCREEN, HOSP PERFORMED
Amphetamines: NOT DETECTED
Barbiturates: NOT DETECTED
Benzodiazepines: NOT DETECTED
Cocaine: NOT DETECTED
Opiates: NOT DETECTED
Tetrahydrocannabinol: POSITIVE — AB

## 2019-06-16 LAB — HIV ANTIBODY (ROUTINE TESTING W REFLEX): HIV Screen 4th Generation wRfx: NONREACTIVE

## 2019-06-16 LAB — MAGNESIUM: Magnesium: 1.7 mg/dL (ref 1.7–2.4)

## 2019-06-16 LAB — HEMOGLOBIN A1C
Hgb A1c MFr Bld: 12.5 % — ABNORMAL HIGH (ref 4.8–5.6)
Mean Plasma Glucose: 312 mg/dL

## 2019-06-16 LAB — GLUCOSE, CAPILLARY: Glucose-Capillary: 232 mg/dL — ABNORMAL HIGH (ref 70–99)

## 2019-06-16 LAB — AMMONIA: Ammonia: <9 umol/L — ABNORMAL LOW (ref 9–35)

## 2019-06-16 LAB — SARS CORONAVIRUS 2 BY RT PCR (HOSPITAL ORDER, PERFORMED IN ~~LOC~~ HOSPITAL LAB): SARS Coronavirus 2: NEGATIVE

## 2019-06-16 LAB — CBG MONITORING, ED: Glucose-Capillary: 231 mg/dL — ABNORMAL HIGH (ref 70–99)

## 2019-06-16 IMAGING — MR MR HEAD WO/W CM
13 of 15 series · 40 of 48 positions shown · IV contrast (gadavist)
Comparison: [DATE]

CLINICAL DATA: Subacute neuro deficit. "Not thinking clearly" and
blurred vision for 5 days

EXAM:
MRI HEAD WITHOUT AND WITH CONTRAST
TECHNIQUE: Multiplanar, multiecho pulse sequences of the brain and surrounding
structures were obtained without and with intravenous contrast.
CONTRAST:  7.5mL GADAVIST GADOBUTROL 1 MMOL/ML IV SOLN

[Series 5: DWI · axial · 3.0mm · 0.88mm/px · z∈[-103,+41]mm · 7 of 98 slices shown (1 of 4)]
[im 1/98]
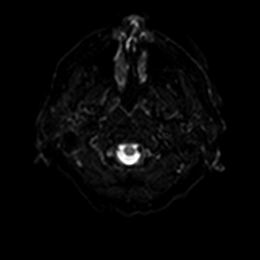
[im 17/98]
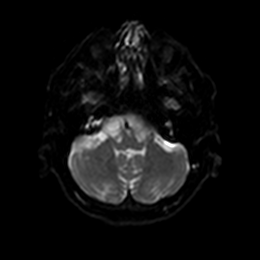
[im 33/98]
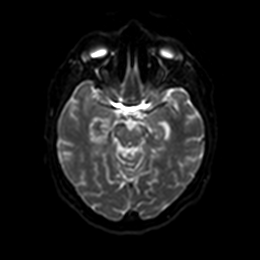
[im 49/98]
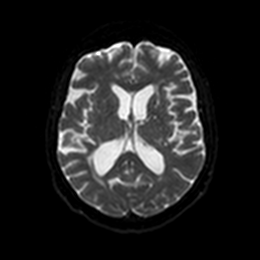
[im 65/98]
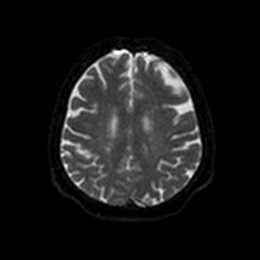
[im 81/98]
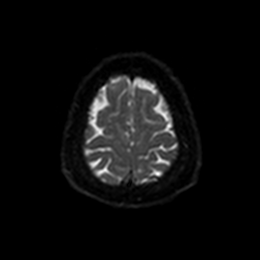
[im 98/98]
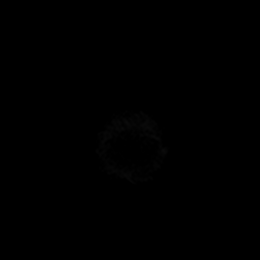

[Series 6: DWI · axial · 3.0mm · 0.88mm/px · z∈[-103,+41]mm · 4 of 48 slices shown (2 of 4)]
[im 1/48]
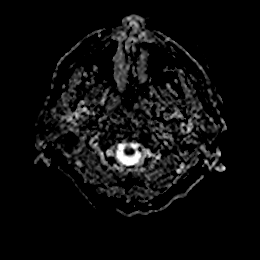
[im 16/48]
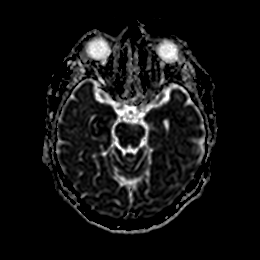
[im 32/48]
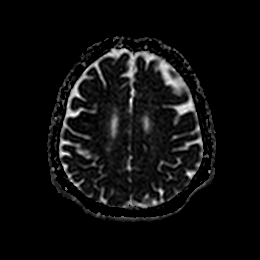
[im 48/48]
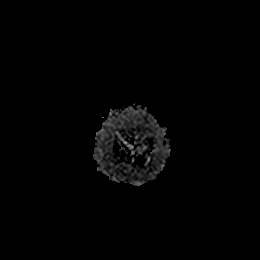

[Series 7: DWI · coronal · 4.0mm · 0.88mm/px · 5 of 66 slices shown (3 of 4)]
[im 1/66]
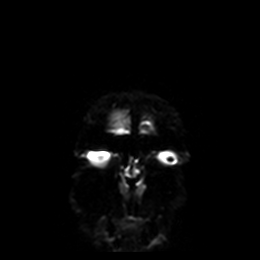
[im 17/66]
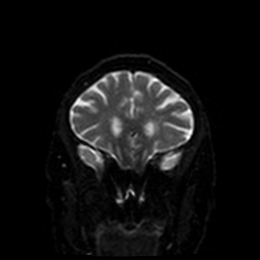
[im 33/66]
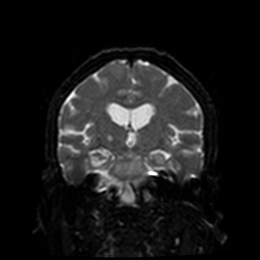
[im 49/66]
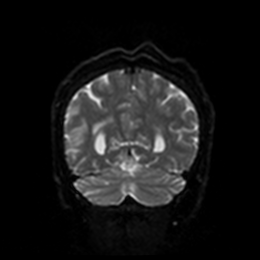
[im 66/66]
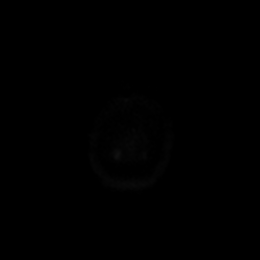

[Series 8: DWI · coronal · 4.0mm · 0.88mm/px · 2 of 33 slices shown (4 of 4)]
[im 1/33]
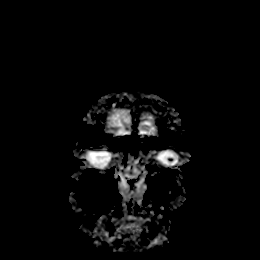
[im 33/33]
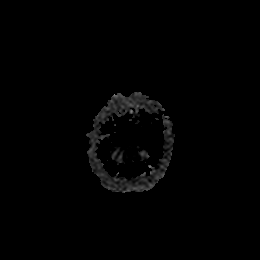

[Series 9: T1 · sagittal · 5.0mm · 0.75mm/px · 2 of 23 slices shown]
[im 1/23]
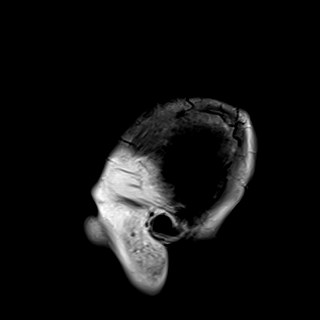
[im 23/23]
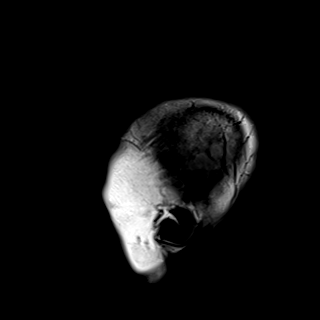

[Series 10: T2 · axial · 5.0mm · 0.72mm/px · z∈[-103,+41]mm · 2 of 25 slices shown]
[im 1/25]
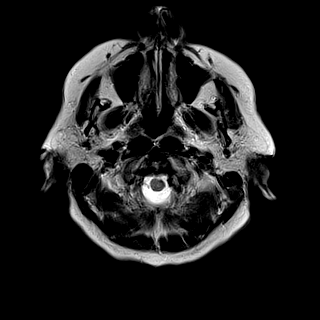
[im 25/25]
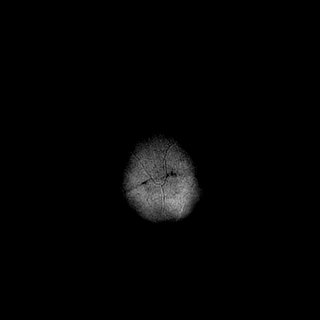

[Series 11: FLAIR · axial · 5.0mm · 0.45mm/px · z∈[-102,+42]mm · 2 of 25 slices shown]
[im 1/25]
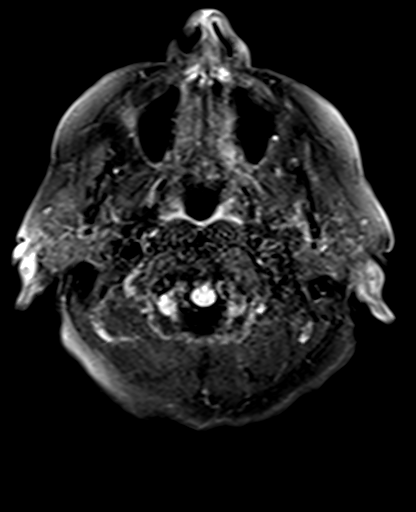
[im 25/25]
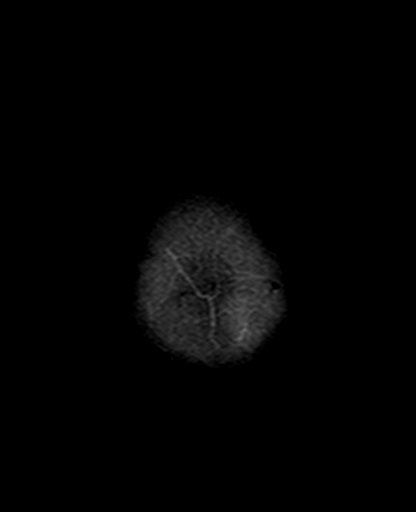

[Series 13: pha_images · axial · 3.0mm · 0.90mm/px · z∈[-118,+55]mm · 4 of 58 slices shown]
[im 1/58]
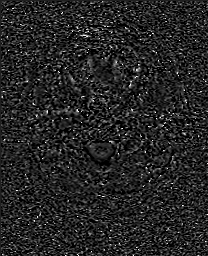
[im 20/58]
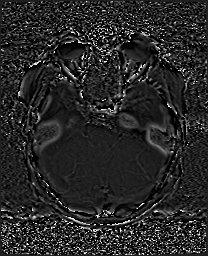
[im 39/58]
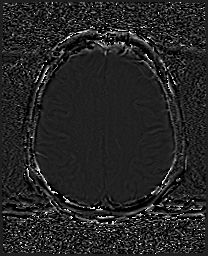
[im 58/58]
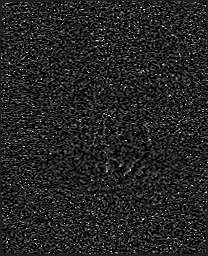

[Series 14: swi_images · axial · 3.0mm · 0.90mm/px · z∈[-118,+58]mm · 4 of 60 slices shown]
[im 1/60]
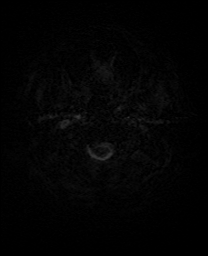
[im 20/60]
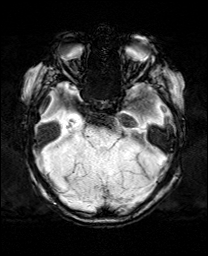
[im 40/60]
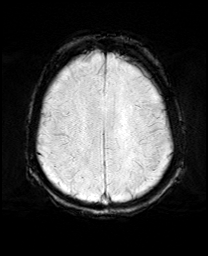
[im 60/60]
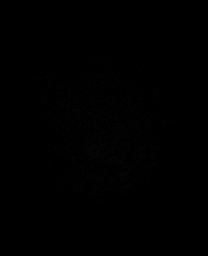

[Series 17: T2 post-contrast · coronal · 5.0mm · 0.72mm/px · 2 of 28 slices shown]
[im 1/28]
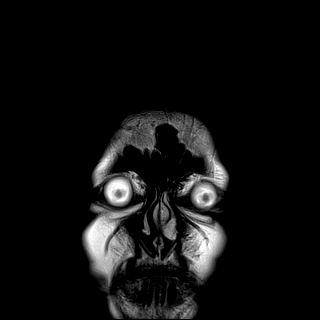
[im 28/28]
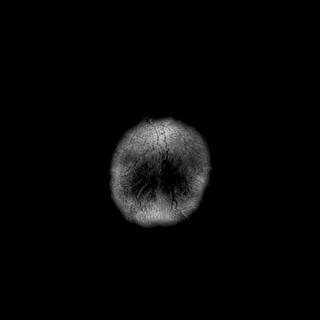

[Series 19: T1 post-contrast · coronal · 5.0mm · 0.34mm/px · 2 of 28 slices shown (1 of 3)]
[im 1/28]
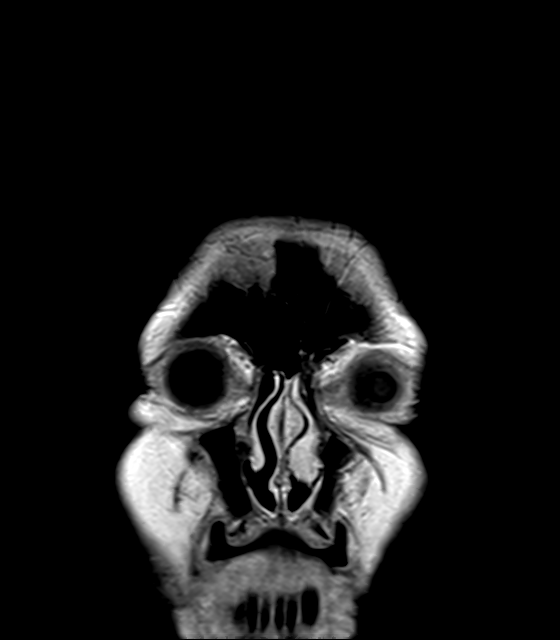
[im 28/28]
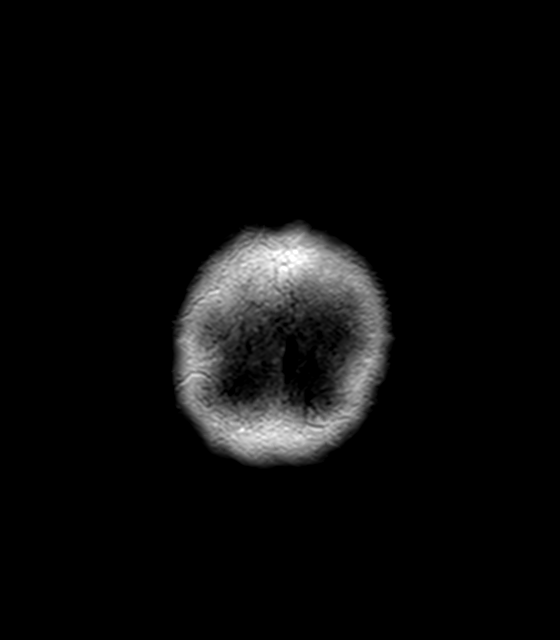

[Series 20: T1 post-contrast · sagittal · 5.0mm · 0.72mm/px · 2 of 23 slices shown (2 of 3)]
[im 1/23]
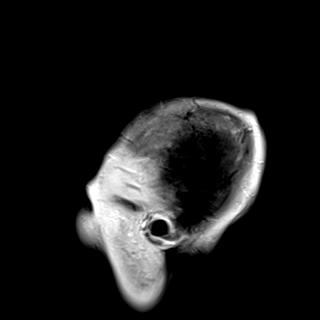
[im 23/23]
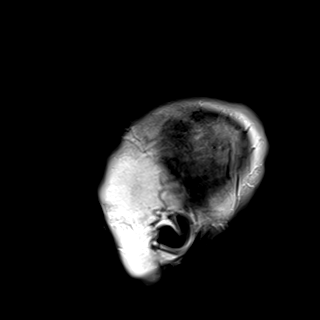

[Series 21: T1 post-contrast · sagittal · 5.0mm · 0.72mm/px · 2 of 23 slices shown (3 of 3)]
[im 1/23]
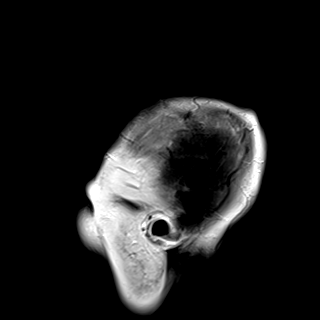
[im 23/23]
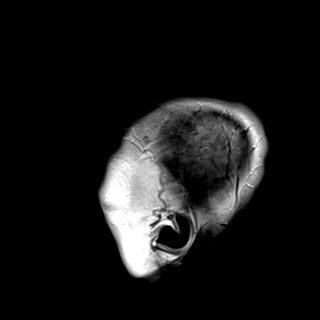

[40 of 48 positions shown; findings below may reference images not displayed]

FINDINGS: Brain: 18 mm area of restricted diffusion in the medial right
temporal lobe with petechial blood products superimposed. Focality,
history, and presence of a right thalamic lacune is most consistent
with infarct rather than an primary inflammatory process. Acute
lacunar infarcts are also seen in the left pons, and left caudate
body. Subacute lacunar infarct seen in the left periatrial white
matter. There is a subcentimeter focus of enhancement along the
parasagittal and anterior left frontal cortex, suspect this is a
subacute infarct.

Background of chronic small vessel ischemic injury especially seen
in the pons and periventricular white matter. There have been other
lacunar infarcts.

No masslike finding, hydrocephalus, or extra-axial collection. Brain
volume is overall normal.

Vascular: Normal flow voids and vessel enhancement

Skull and upper cervical spine: Normal marrow signal

Sinuses/Orbits: Negative
IMPRESSION: 1. Acute infarct in the medial right temporal lobe, at the
hippocampus, with petechial hemorrhage. Acute and subacute lacunar
infarcts also seen in the left pons, right thalamus, left caudate
body, and left periatrial white matter. Multiple acute lacunar
infarcts is somewhat unusual, no abnormal enhancement to suggest an
underlying leptomeningeal inflammatory process.
2. Small focus of cortical enhancement along the parasagittal left
frontal lobe, favor subacute infarct given the constellation of
findings. If malignancy history, would obtain a 2 month follow-up to
ensure resolution.
3. Background small vessel disease.

## 2019-06-16 IMAGING — CT CT ANGIO HEAD
2 of 7 series · 8 of 33 positions shown · IV contrast (omnipaque)
Comparison: MRI same day.  Previous CT angiography [DATE].

CLINICAL DATA: Altered mental status. Blurred vision. Multiple deep
brain insults by MRI.

EXAM:
CT ANGIOGRAPHY HEAD AND NECK
TECHNIQUE: Multidetector CT imaging of the head and neck was performed using
the standard protocol during bolus administration of intravenous
contrast. Multiplanar CT image reconstructions and MIPs were
obtained to evaluate the vascular anatomy. Carotid stenosis
measurements (when applicable) are obtained utilizing NASCET
criteria, using the distal internal carotid diameter as the
denominator.
CONTRAST:  75mL OMNIPAQUE IOHEXOL 350 MG/ML SOLN

[Series 6: cta neck · axial · 0.47mm/px · z∈[-242,-126]mm · 2 of 175 slices shown]
[im 59/175  soft-tissue]
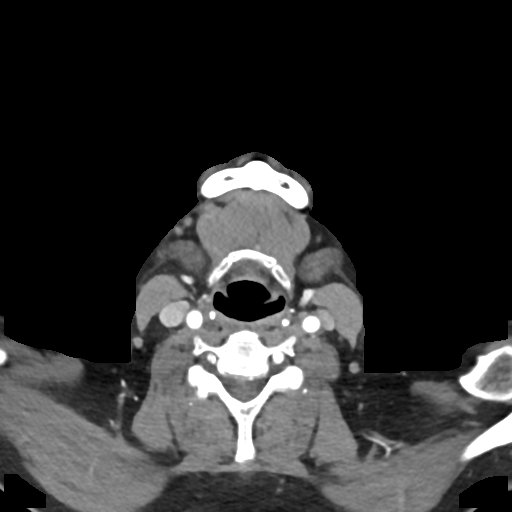
[im 117/175  soft-tissue]
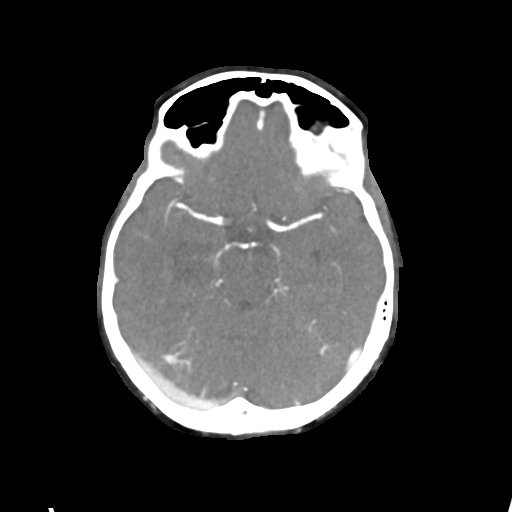

[Series 8: cta neck axial · axial · 0.39mm/px · z∈[-308,-61]mm · 6 of 347 slices shown]
[im 50/347  soft-tissue]
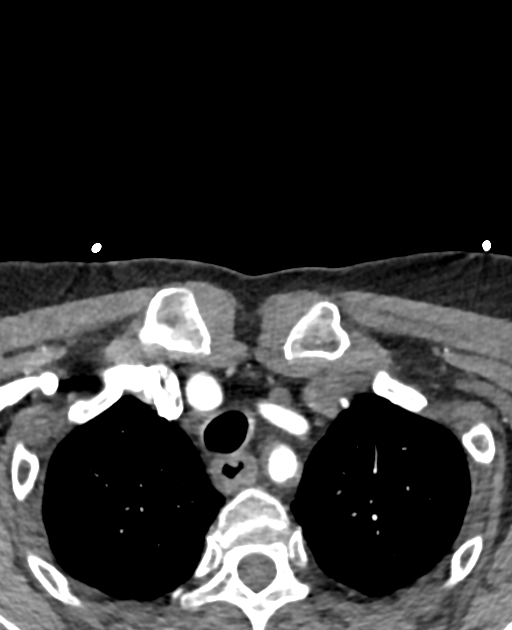
[im 99/347  bone]
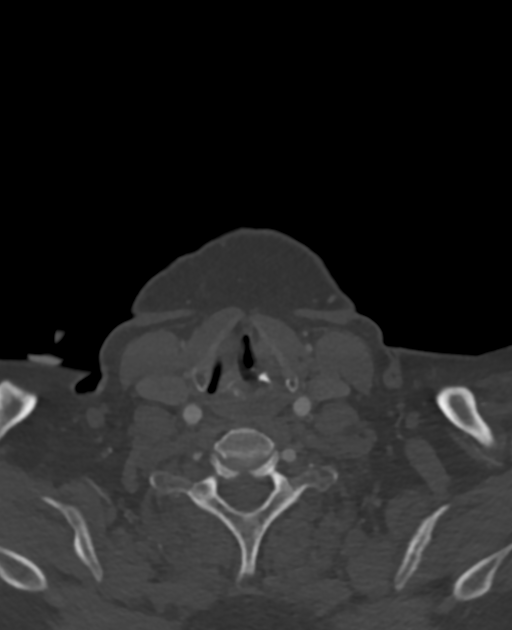
[im 149/347  soft-tissue]
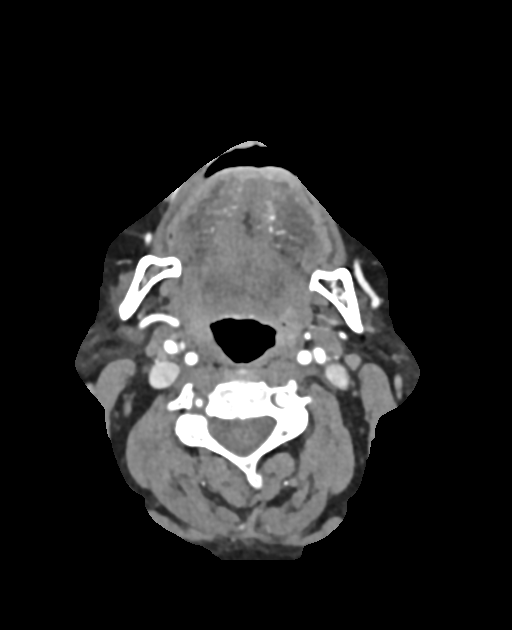
[im 198/347  bone]
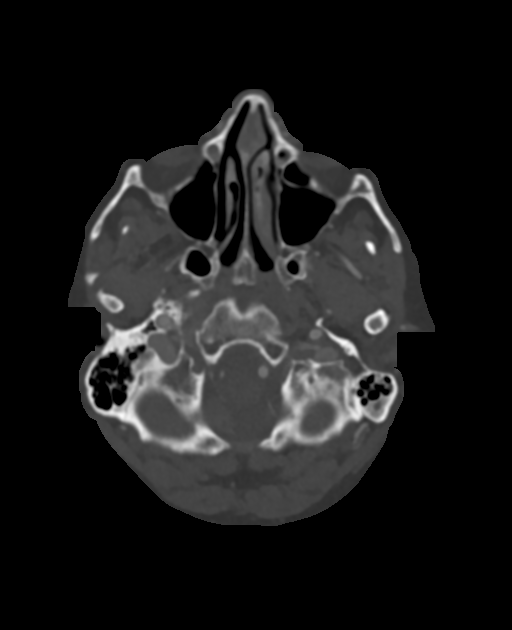
[im 248/347  soft-tissue]
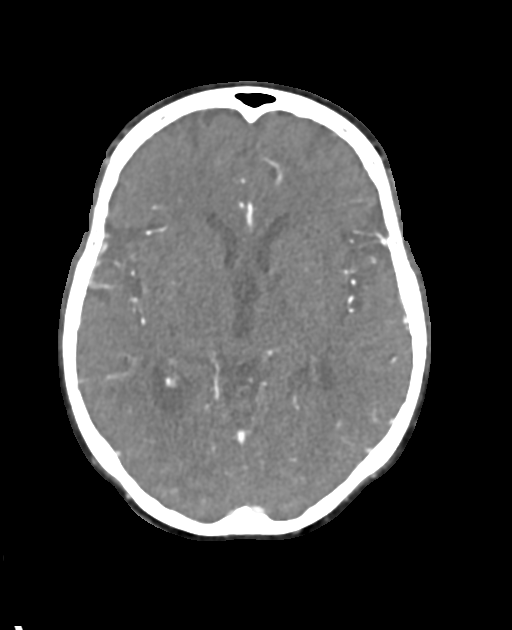
[im 297/347  bone]
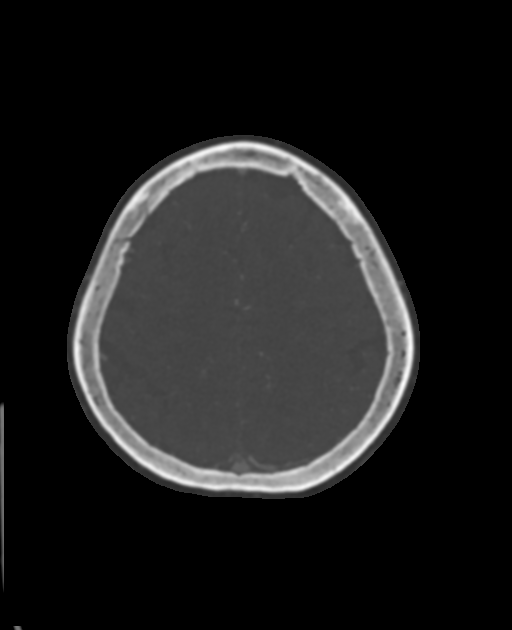

[8 of 33 positions shown; findings below may reference images not displayed]

FINDINGS: CTA NECK FINDINGS

Aortic arch: Aortic atherosclerotic calcification. No aneurysm or
dissection. Branching pattern is normal without origin stenosis.
Ordinary soft and calcified plaque at the proximal left subclavian
artery without stenosis.

Right carotid system: Common carotid artery is tortuous but widely
patent to the bifurcation region. There is soft and calcified plaque
at the carotid bifurcation and ICA but no stenosis when compared to
the more distal cervical ICA diameter.

Left carotid system: Common carotid artery widely patent to the
bifurcation region. Primarily calcified plaque at the carotid
bifurcation and ICA bulb but no stenosis compared to the more distal
cervical ICA diameter.

Vertebral arteries: Left vertebral artery is dominant, widely patent
at the origin and widely patent through the cervical region to the
foramen magnum. Non dominant right vertebral artery shows a 30%
stenosis at its origin and serial foci of atherosclerotic plaque in
the cervical region with several stenoses on the order of 30%.

Skeleton: Ordinary mid cervical spondylosis.

Other neck: No mass or lymphadenopathy.

Upper chest: Negative

Review of the MIP images confirms the above findings

CTA HEAD FINDINGS

Anterior circulation: Both internal carotid arteries are patent
through the skull base and siphon regions. Extensive atherosclerotic
calcification is present within the carotid siphon regions with
stenosis estimated at 50% on both sides. The anterior and middle
cerebral vessels are patent without evidence of large or medium
vessel occlusion.

Posterior circulation: Non dominant right vertebral artery shows
calcified plaque as it passes the dura, with stenosis estimated at
50% in this location. Right PICA is visualized. Small V4 segment
reaches the basilar. Dominant left vertebral artery is widely patent
through the foramen magnum to the basilar. There is mild
atherosclerotic irregularity of the basilar artery but no flow
limiting stenosis. Right anterior inferior cerebellar artery is
present. Both superior cerebellar arteries show flow. Both posterior
cerebral arteries show flow. Distal PCA branch vessels show
considerable atherosclerotic irregularity.

Venous sinuses: Patent and normal.

Anatomic variants: None significant.

Review of the MIP images confirms the above findings
IMPRESSION: No acute large or medium vessel occlusion.

Aortic Atherosclerosis ([68]-[68]).

Atherosclerotic change at both carotid bifurcations but without
stenosis. Pronounced atherosclerosis in both carotid siphon regions
with stenosis estimated at 50% on each side. Distal branch vessel
atherosclerotic irregularity affecting the MCA branches.

Dominant left vertebral artery widely patent to the basilar. Non
dominant right vertebral artery shows 30% stenosis at its origin and
serial 30% narrowings in the cervical region. There is 50% stenosis
as the right vertebral artery enters the dura. Mild atherosclerotic
irregularity of the basilar artery but no basilar stenosis. Major
posterior circulation branch vessels show flow, though there is
considerable atherosclerotic irregularity of the PCA branch vessels
on both sides.

The overall appearance is not discernibly different from study of
[DATE].

## 2019-06-16 MED ORDER — CLOPIDOGREL BISULFATE 75 MG PO TABS
75.0000 mg | ORAL_TABLET | Freq: Every day | ORAL | Status: DC
  Administered 2019-06-16 – 2019-06-18 (×3): 75 mg via ORAL
  Filled 2019-06-16 (×3): qty 1

## 2019-06-16 MED ORDER — POTASSIUM CHLORIDE 10 MEQ/100ML IV SOLN
10.0000 meq | INTRAVENOUS | Status: AC
  Administered 2019-06-16 (×5): 10 meq via INTRAVENOUS
  Filled 2019-06-16 (×5): qty 100

## 2019-06-16 MED ORDER — ASPIRIN EC 81 MG PO TBEC
81.0000 mg | DELAYED_RELEASE_TABLET | Freq: Every day | ORAL | Status: DC
  Administered 2019-06-17 – 2019-06-18 (×2): 81 mg via ORAL
  Filled 2019-06-16 (×2): qty 1

## 2019-06-16 MED ORDER — GADOBUTROL 1 MMOL/ML IV SOLN
7.5000 mL | Freq: Once | INTRAVENOUS | Status: AC | PRN
  Administered 2019-06-16: 7.5 mL via INTRAVENOUS

## 2019-06-16 MED ORDER — ACETAMINOPHEN 160 MG/5ML PO SOLN: 650.0000 mg | ORAL | Status: DC | PRN

## 2019-06-16 MED ORDER — MAGNESIUM SULFATE IN D5W 1-5 GM/100ML-% IV SOLN
1.0000 g | Freq: Once | INTRAVENOUS | Status: AC
  Administered 2019-06-16: 1 g via INTRAVENOUS
  Filled 2019-06-16: qty 100

## 2019-06-16 MED ORDER — HYDRALAZINE HCL 20 MG/ML IJ SOLN
10.0000 mg | INTRAMUSCULAR | Status: DC | PRN
  Administered 2019-06-16: 10 mg via INTRAVENOUS
  Filled 2019-06-16: qty 1

## 2019-06-16 MED ORDER — POTASSIUM CHLORIDE CRYS ER 20 MEQ PO TBCR
60.0000 meq | EXTENDED_RELEASE_TABLET | ORAL | Status: DC
  Filled 2019-06-16: qty 3

## 2019-06-16 MED ORDER — ONDANSETRON HCL 4 MG/2ML IJ SOLN
4.0000 mg | Freq: Four times a day (QID) | INTRAMUSCULAR | Status: DC | PRN
  Administered 2019-06-16: 4 mg via INTRAVENOUS
  Filled 2019-06-16: qty 2

## 2019-06-16 MED ORDER — SODIUM CHLORIDE 0.9 % IV SOLN: Freq: Once | INTRAVENOUS | Status: AC

## 2019-06-16 MED ORDER — LISINOPRIL 10 MG PO TABS
10.0000 mg | ORAL_TABLET | Freq: Every day | ORAL | Status: DC
  Administered 2019-06-16 – 2019-06-18 (×3): 10 mg via ORAL
  Filled 2019-06-16 (×3): qty 1

## 2019-06-16 MED ORDER — ACETAMINOPHEN 325 MG PO TABS: 650.0000 mg | ORAL_TABLET | ORAL | Status: DC | PRN

## 2019-06-16 MED ORDER — VENLAFAXINE HCL ER 75 MG PO CP24
75.0000 mg | ORAL_CAPSULE | Freq: Every day | ORAL | Status: DC
  Administered 2019-06-16 – 2019-06-18 (×3): 75 mg via ORAL
  Filled 2019-06-16 (×3): qty 1

## 2019-06-16 MED ORDER — INSULIN ASPART 100 UNIT/ML ~~LOC~~ SOLN
0.0000 [IU] | Freq: Every day | SUBCUTANEOUS | Status: DC
  Administered 2019-06-16: 2 [IU] via SUBCUTANEOUS

## 2019-06-16 MED ORDER — ATORVASTATIN CALCIUM 40 MG PO TABS
40.0000 mg | ORAL_TABLET | Freq: Every day | ORAL | Status: DC
  Administered 2019-06-16: 40 mg via ORAL
  Filled 2019-06-16: qty 1

## 2019-06-16 MED ORDER — ACETAMINOPHEN 650 MG RE SUPP: 650.0000 mg | RECTAL | Status: DC | PRN

## 2019-06-16 MED ORDER — STROKE: EARLY STAGES OF RECOVERY BOOK
Freq: Once | Status: AC
  Filled 2019-06-16: qty 1

## 2019-06-16 MED ORDER — NICOTINE 21 MG/24HR TD PT24
21.0000 mg | MEDICATED_PATCH | Freq: Every day | TRANSDERMAL | Status: DC
  Administered 2019-06-16 – 2019-06-18 (×3): 21 mg via TRANSDERMAL
  Filled 2019-06-16 (×3): qty 1

## 2019-06-16 MED ORDER — SENNOSIDES-DOCUSATE SODIUM 8.6-50 MG PO TABS: 1.0000 | ORAL_TABLET | Freq: Every evening | ORAL | Status: DC | PRN

## 2019-06-16 MED ORDER — TRAMADOL HCL 50 MG PO TABS
50.0000 mg | ORAL_TABLET | Freq: Two times a day (BID) | ORAL | Status: DC | PRN
  Administered 2019-06-16 – 2019-06-18 (×5): 50 mg via ORAL
  Filled 2019-06-16 (×6): qty 1

## 2019-06-16 MED ORDER — INSULIN ASPART 100 UNIT/ML ~~LOC~~ SOLN
0.0000 [IU] | Freq: Three times a day (TID) | SUBCUTANEOUS | Status: DC
  Administered 2019-06-17: 8 [IU] via SUBCUTANEOUS
  Administered 2019-06-17 – 2019-06-18 (×3): 3 [IU] via SUBCUTANEOUS

## 2019-06-16 MED ORDER — ENOXAPARIN SODIUM 40 MG/0.4ML ~~LOC~~ SOLN
40.0000 mg | SUBCUTANEOUS | Status: DC
  Administered 2019-06-17 – 2019-06-18 (×2): 40 mg via SUBCUTANEOUS
  Filled 2019-06-16 (×2): qty 0.4

## 2019-06-16 MED ORDER — IOHEXOL 350 MG/ML SOLN
75.0000 mL | Freq: Once | INTRAVENOUS | Status: AC | PRN
  Administered 2019-06-16: 75 mL via INTRAVENOUS

## 2019-06-16 MED ORDER — HYDRALAZINE HCL 20 MG/ML IJ SOLN
10.0000 mg | INTRAMUSCULAR | Status: DC | PRN
  Filled 2019-06-16: qty 1

## 2019-06-16 MED ORDER — ASPIRIN EC 81 MG PO TBEC
81.0000 mg | DELAYED_RELEASE_TABLET | Freq: Every day | ORAL | Status: DC
  Filled 2019-06-16: qty 1

## 2019-06-16 MED ORDER — DEXTROSE 5 % IV SOLN
750.0000 mg | Freq: Once | INTRAVENOUS | Status: AC
  Administered 2019-06-16: 750 mg via INTRAVENOUS
  Filled 2019-06-16: qty 15

## 2019-06-16 NOTE — Progress Notes (Signed)
EEG complete - results pending 

## 2019-06-16 NOTE — ED Notes (Signed)
Attempted report 
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
## 2019-06-16 NOTE — Procedures (Signed)
Patient Name: Carrie King  MRN: [id number]  Epilepsy Attending: Charlsie Quest  Referring Physician/Provider: Dr Caryl Pina Date: 06/16/2019 Duration: 25.29 mins  Patient history: 64yo F with confusion and shuffling gait. EEG to evaluate for seizure.  Level of alertness: Awake, asleep  AEDs during EEG study: None  Technical aspects: This EEG study was done with scalp electrodes positioned according to the 10-20 International system of electrode placement. Electrical activity was acquired at a sampling rate of 500Hz  and reviewed with a high frequency filter of 70Hz  and a low frequency filter of 1Hz . EEG data were recorded continuously and digitally stored.   Description: The posterior dominant rhythm consists of 8-9 Hz activity of moderate voltage (25-35 uV) seen predominantly in posterior head regions, symmetric and reactive to eye opening and eye closing. Sleep was characterized by vertex waves, sleep spindles (12 to 14 Hz), maximal frontocentral region. EEG also showed intermittent rhythmic generalized 2-3Hz  delta slowing. Physiology photic driving was not seen during photic stimulation. Hyperventilation was not performed.     ABNORMALITY -Intermittent rhythmic slow, generalized  IMPRESSION: This study is suggestive of mild diffuse encephalopathy, nonspecific etiology. No seizures or epileptiform discharges were seen throughout the recording.   Nautica Hotz 

## 2019-06-16 NOTE — ED Notes (Signed)
Dr. Katrinka Blazing at bedside, pt BP is now 177/83

## 2019-06-16 NOTE — ED Provider Notes (Addendum)
.  Lumbar Puncture  Date/Time: 06/16/2019 5:44 AM Performed by: Ceonna Frazzini, Layla Maw, DO Authorized by: Dayven Linsley, Layla Maw, DO   Consent:    Consent obtained:  Written   Consent given by:  Patient   Risks discussed:  Bleeding, headache, infection, nerve damage, repeat procedure and pain Pre-procedure details:    Procedure purpose:  Diagnostic   Preparation: Patient was prepped and draped in usual sterile fashion   Sedation:    Sedation type:  Anxiolysis Procedure details:    Lumbar space:  L3-L4 interspace   Patient position:  Sitting   Needle gauge:  18   Needle type:  Spinal needle - Quincke tip   Needle length (in):  3.5   Ultrasound guidance: no     Number of attempts:  1   Opening pressure (cm H2O): not obtained due to need for patient to sit up for appropriate positioning.   Fluid appearance:  Clear   Tubes of fluid:  4   Total volume (ml):  5 Post-procedure:    Puncture site:  Adhesive bandage applied   Patient tolerance of procedure:  Tolerated well, no immediate complications   I was asked to see patient to perform lumbar puncture to rule out HSV recommended by neurology.  Seen initially by previous provider Dr. Lynelle Doctor.  MRI of the brain showed concern for acute and subacute infarct seen diffusely.  No fevers.  On aspirin but no anticoagulation.  Patient consented for lumbar puncture at bedside.  Lumbar puncture performed with patient sitting upright and therefore opening pressure was not able to be obtained.  Patient needed to be sitting upright due to difficulty with getting her an appropriate position.  CSF results pending.  Admitted to the hospitalist service.   Shamell Hittle, Layla Maw, DO 06/16/19 0630    Eddye Broxterman, Layla Maw, DO 06/16/19 [phone]

## 2019-06-16 NOTE — ED Notes (Signed)
Lunch Tray Ordered @ 1040. 

## 2019-06-16 NOTE — ED Notes (Signed)
Pt has been laying supine since LP, has raised HOB only approx 15 degrees to finish swallow screen . Has been slowly raising HOB since approx 1315 to limit HA. Tolerated well. Daughter is at bedside. Has c/o being hot then cold, requested a fan-- given to pt per Diplomatic Services operational officer

## 2019-06-16 NOTE — ED Notes (Signed)
Pt now vomiting. Messaged admitting MD for antiemetic.

## 2019-06-17 ENCOUNTER — Inpatient Hospital Stay (HOSPITAL_COMMUNITY): Payer: Medicaid Other

## 2019-06-17 DIAGNOSIS — I6389 Other cerebral infarction: Secondary | ICD-10-CM

## 2019-06-17 LAB — LIPID PANEL
Cholesterol: 248 mg/dL — ABNORMAL HIGH (ref 0–200)
HDL: 57 mg/dL (ref >40)
LDL Cholesterol: 168 mg/dL — ABNORMAL HIGH (ref 0–99)
Total CHOL/HDL Ratio: 4.4 RATIO
Triglycerides: 113 mg/dL (ref <150)
VLDL: 23 mg/dL (ref 0–40)

## 2019-06-17 LAB — CBC
HCT: 38.3 % (ref 36.0–46.0)
Hemoglobin: 13 g/dL (ref 12.0–15.0)
MCH: 30.3 pg (ref 26.0–34.0)
MCHC: 33.9 g/dL (ref 30.0–36.0)
MCV: 89.3 fL (ref 80.0–100.0)
Platelets: 228 10*3/uL (ref 150–400)
RBC: 4.29 MIL/uL (ref 3.87–5.11)
RDW: 12.5 % (ref 11.5–15.5)
WBC: 6.7 10*3/uL (ref 4.0–10.5)
nRBC: 0 % (ref 0.0–0.2)

## 2019-06-17 LAB — HSV DNA BY PCR (REFERENCE LAB)
HSV 1 DNA: NEGATIVE
HSV 2 DNA: NEGATIVE

## 2019-06-17 LAB — GLUCOSE, CAPILLARY
Glucose-Capillary: 198 mg/dL — ABNORMAL HIGH (ref 70–99)
Glucose-Capillary: 293 mg/dL — ABNORMAL HIGH (ref 70–99)
Glucose-Capillary: 160 mg/dL — ABNORMAL HIGH (ref 70–99)
Glucose-Capillary: 183 mg/dL — ABNORMAL HIGH (ref 70–99)

## 2019-06-17 LAB — BASIC METABOLIC PANEL
Anion gap: 10 (ref 5–15)
BUN: 12 mg/dL (ref 8–23)
CO2: 25 mmol/L (ref 22–32)
Calcium: 9 mg/dL (ref 8.9–10.3)
Chloride: 99 mmol/L (ref 98–111)
Creatinine, Ser: 0.68 mg/dL (ref 0.44–1.00)
GFR calc Af Amer: >60 mL/min (ref >60)
GFR calc non Af Amer: >60 mL/min (ref >60)
Glucose, Bld: 207 mg/dL — ABNORMAL HIGH (ref 70–99)
Potassium: 3.5 mmol/L (ref 3.5–5.1)
Sodium: 134 mmol/L — ABNORMAL LOW (ref 135–145)

## 2019-06-17 LAB — ECHOCARDIOGRAM COMPLETE
Height: 64 in
Weight: 2659.63 oz

## 2019-06-17 MED ORDER — INSULIN GLARGINE 100 UNIT/ML ~~LOC~~ SOLN
10.0000 [IU] | Freq: Every day | SUBCUTANEOUS | Status: DC
  Administered 2019-06-17 – 2019-06-18 (×2): 10 [IU] via SUBCUTANEOUS
  Filled 2019-06-17 (×2): qty 0.1

## 2019-06-17 MED ORDER — TRAZODONE HCL 50 MG PO TABS
25.0000 mg | ORAL_TABLET | Freq: Every evening | ORAL | Status: AC | PRN
  Administered 2019-06-17: 25 mg via ORAL
  Filled 2019-06-17: qty 1

## 2019-06-17 MED ORDER — ATORVASTATIN CALCIUM 80 MG PO TABS
80.0000 mg | ORAL_TABLET | Freq: Every day | ORAL | Status: DC
  Administered 2019-06-17: 80 mg via ORAL
  Filled 2019-06-17: qty 1

## 2019-06-17 NOTE — Progress Notes (Signed)
OT Cancellation Note  Patient Details Name: Carrie King MRN: [id number] DOB: January 08, 1956   Cancelled Treatment:    Reason Eval/Treat Not Completed: Patient at procedure or test/ unavailable- initiated eval and transport arrived to take pt to echo.  Will follow see as able.   Barry Brunner, OT Acute Rehabilitation Services Pager [phone] Office [phone]   Chancy Milroy 06/17/2019, 12:12 PM

## 2019-06-17 NOTE — Progress Notes (Signed)
SLP Cancellation Note  Patient Details Name: Carrie King MRN: [id number] DOB: 08-09-1955   Cancelled treatment:       Reason Eval/Treat Not Completed: Patient at procedure or test/unavailable   Yanin Muhlestein, Riley Nearing 06/17/2019, 11:22 AM

## 2019-06-17 NOTE — Progress Notes (Signed)
Inpatient Diabetes Program Recommendations  AACE/ADA: New Consensus Statement on Inpatient Glycemic Control (2015)  Target Ranges:  Prepandial:   less than 140 mg/dL      Peak postprandial:   less than 180 mg/dL (1-2 hours)      Critically ill patients:  140 - 180 mg/dL   Lab Results  Component Value Date   GLUCAP 198 (H) 06/17/2019   HGBA1C 12.5 (H) 06/16/2019    Review of Glycemic Control  Diabetes history: Type 2 Outpatient Diabetes medications: Amaryl 1 mg daily Current orders for Inpatient glycemic control: Lantus 10 units daily, Novolog MODERATE correction scale TID & HS scale  Inpatient Diabetes Program Recommendations:   Received diabetes coordinator consult. Noted that patient's HgbA1C is 12.5%. Will continue to monitor blood sugars while in the hospital. Will talk with patient when more appropriate.   Smith Mince RN BSN CDE Diabetes Coordinator Pager: [phone]  8am-5pm

## 2019-06-17 NOTE — Progress Notes (Signed)
  Echocardiogram 2D Echocardiogram has been performed.  Carrie King 06/17/2019, 12:12 PM

## 2019-06-17 NOTE — Plan of Care (Signed)

## 2019-06-17 NOTE — TOC Initial Note (Addendum)
Transition of Care [facility]) - Initial/Assessment Note    Patient Details  Name: Carrie King MRN: [id number] Date of Birth: 1955-08-21  [facility] CM/SW Contact:    Pollie Friar, RN Phone Number: 06/17/2019, 3:45 PM  Clinical Narrative:                 Pt is from home with spouse that uses a wheelchair. Recommendations are for [facility] but pt and CM discussed it and she prefers outpatient therapy. MD in agreement. Pt prefers to attend at [facility] therapy. CM has faxed the outpatient PT orders to [facility].  3 in 1 and walker ordered through [place] and will be delivered to the room. Pt has not been compliant with medications at home. Daughter is planning of making sure she takes her medications as prescribed.  Pt has transportation home when medically ready.  Expected Discharge Plan: OP Rehab Barriers to Discharge: Continued Medical Work up   Patient Goals and CMS Choice     Choice offered to / list presented to : Patient  Expected Discharge Plan and Services Expected Discharge Plan: OP Rehab   Discharge Planning Services: CM Consult   Living arrangements for the past 2 months: Single Family Home                 DME Arranged: 3-N-1, Walker rolling DME Agency: [employer] Date DME Agency Contacted: 06/17/19   Representative spoke with at DME Agency: [place]            Prior Living Arrangements/Services Living arrangements for the past 2 months: Single Family Home Lives with:: Spouse Patient language and need for interpreter reviewed:: Yes Do you feel safe going back to the place where you live?: Yes      Need for Family Participation in Patient Care: Yes (Comment) Care giver support system in place?: Yes (comment)(daughter is able to provide some supervision)   Criminal Activity/Legal Involvement Pertinent to Current Situation/Hospitalization: No - Comment as needed  Activities of Daily Living      Permission Sought/Granted                   Emotional Assessment Appearance:: Appears stated age Attitude/Demeanor/Rapport: Engaged Affect (typically observed): Accepting Orientation: : Oriented to Self, Oriented to Place, Oriented to  Time, Oriented to Situation   Psych Involvement: No (comment)  Admission diagnosis:  Gait abnormality [R26.9] CVA (cerebral vascular accident) ([place]) [I63.9] AMS (altered mental status) [R41.82] Patient Active Problem List   Diagnosis Date Noted  . Acute metabolic encephalopathy 76/16/0737  . Elevated troponin 06/16/2019  . Hypokalemia 06/16/2019  . HLD (hyperlipidemia) 06/16/2019  . CVA (cerebral vascular accident) ([place]) 06/16/2019  . Anxiety and depression 06/24/2018  . 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 Acute CVA (cerebrovascular accident) ([place]) 05/31/2018  . Hypertension 05/31/2018  . Rupture of left triceps tendon 05/20/2018  . Medial malleolar fracture 05/20/2018  . Diabetes mellitus ([place]) 05/16/2018  . Open bicondylar fracture of distal humerus, left, initial encounter 05/14/2018  . Left elbow fracture 05/14/2018  . Open Monteggia's fracture of left ulna, type IIIA, IIIB, or IIIC 05/14/2018   PCP:  Charlott Rakes, MD Pharmacy:   [place] [place] [address] Phone: [phone] Fax: [phone]     Social Determinants of Health (SDOH) Interventions    Readmission Risk Interventions Readmission Risk Prevention Plan 05/20/2018  Post Dischage Appt Complete  Medication Screening Complete  Transportation  Screening Complete  Some recent data might be hidden
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
## 2019-06-17 NOTE — Progress Notes (Signed)
   06/17/19 1456  Vitals  Temp 98.7 F (37.1 C)  Temp Source Oral  BP (!) 175/90  MAP (mmHg) 102  BP Location Left Arm  BP Method Automatic  Patient Position (if appropriate) Lying  Pulse Rate (!) 101  Pulse Rate Source Dinamap  ECG Heart Rate (!) 102  Resp 17  Level of Consciousness  Level of Consciousness Alert  Oxygen Therapy  SpO2 98 %  O2 Device Room Air  MEWS Score  MEWS Temp 0  MEWS Systolic 0  MEWS Pulse 1  MEWS RR 1  MEWS LOC 0  MEWS Score 2  MEWS Score Color Yellow  Patient mews scores are 2 at this time. Patient is alert and oriented X's 4, No distress noted at this time. Vital signs listed above, MD notified of patient condition. Per MD nothing can be done at this time , waiting for neurologist to see patient then D/C plan will be concluded. Safety observation in place, will continue to monitor.
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
## 2019-06-17 NOTE — Progress Notes (Signed)
STROKE TEAM PROGRESS NOTE   INTERVAL HISTORY Daughter at bedside. Pt lying in bed, more awake alert and orientated fully. More lucid than yesterday. No neuro deficit.  LDL 168 and A1C 12.5. stroke risk factor modification education provided. Smoking cessation education provided.   Vitals:   06/16/19 1938 06/16/19 2343 06/17/19 0412 06/17/19 0837  BP: (!) 176/86 (!) 173/90 (!) 155/75 (!) 160/92  Pulse: (!) 101 93 78 86  Resp:  20 19 18   Temp: 98.3 F (36.8 C) 98.4 F (36.9 C) 98.1 F (36.7 C) 98.1 F (36.7 C)  TempSrc: Oral Oral Oral Oral  SpO2: 96% 96% 97% 98%  Weight:      Height:       CBC:  Recent Labs  Lab 06/15/19 2224 06/15/19 2224 06/15/19 2238 06/17/19 0229  WBC 7.5  --   --  6.7  NEUTROABS 5.3  --   --   --   HGB 12.7   < > 11.2* 13.0  HCT 36.8   < > 33.0* 38.3  MCV 89.8  --   --  89.3  PLT 231  --   --  228   < > = values in this interval not displayed.   Basic Metabolic Panel:  Recent Labs  Lab 06/15/19 2224 06/15/19 2224 06/15/19 2238 06/16/19 0938 06/17/19 0229  NA 137   < > 134*  --  134*  K 3.1*   < > 3.0*  --  3.5  CL 98   < > 98  --  99  CO2 27  --   --   --  25  GLUCOSE 312*   < > 312*  --  207*  BUN 12   < > 11  --  12  CREATININE 0.69   < > 0.60  --  0.68  CALCIUM 9.1  --   --   --  9.0  MG  --   --   --  1.7  --    < > = values in this interval not displayed.   Lipid Panel:     Component Value Date/Time   CHOL 248 (H) 06/17/2019 0229   TRIG 113 06/17/2019 0229   HDL 57 06/17/2019 0229   CHOLHDL 4.4 06/17/2019 0229   VLDL 23 06/17/2019 0229   LDLCALC 168 (H) 06/17/2019 0229   HgbA1c:  Lab Results  Component Value Date   HGBA1C 12.5 (H) 06/16/2019   Urine Drug Screen:     Component Value Date/Time   LABOPIA NONE DETECTED 06/15/2019 2231   COCAINSCRNUR NONE DETECTED 06/15/2019 2231   LABBENZ NONE DETECTED 06/15/2019 2231   AMPHETMU NONE DETECTED 06/15/2019 2231   THCU POSITIVE (A) 06/15/2019 2231   LABBARB NONE DETECTED  06/15/2019 2231    Alcohol Level     Component Value Date/Time   ETH <10 05/14/2018 0928    IMAGING past 24 hours No results found.  PHYSICAL EXAM  Temp:  [98.1 F (36.7 C)-99.5 F (37.5 C)] 98.1 F (36.7 C) (05/20 0837) Pulse Rate:  [78-103] 86 (05/20 0837) Resp:  [15-20] 18 (05/20 0837) BP: (150-184)/(75-105) 160/92 (05/20 0837) SpO2:  [94 %-100 %] 98 % (05/20 0837)  General - Well nourished, well developed, in no apparent distress, lethargic.  Ophthalmologic - fundi not visualized due to noncooperation.  Cardiovascular - Regular rhythm and rate.  Mental Status -  Level of arousal and orientation to time, place, and person were intact. Language including expression, naming, repetition, comprehension was assessed  and found intact.  Cranial Nerves II - XII - II - Visual field intact OU. III, IV, VI - Extraocular movements intact. V - Facial sensation intact bilaterally. VII - Facial movement intact bilaterally. VIII - Hearing & vestibular intact bilaterally. X - Palate elevates symmetrically. XI - Chin turning & shoulder shrug intact bilaterally. XII - Tongue protrusion intact.  Motor Strength - The patient's strength was normal in all extremities except chronic left tricep 3+/5 and pronator drift was absent.  Bulk was normal and fasciculations were absent.   Motor Tone - Muscle tone was assessed at the neck and appendages and was normal.  Reflexes - The patient's reflexes were symmetrical in all extremities and she had no pathological reflexes.  Sensory - Light touch, temperature/pinprick were assessed and were symmetrical.    Coordination - The patient had normal movements in the hands with no ataxia or dysmetria.  Tremor was absent.  Gait and Station - deferred.   ASSESSMENT/PLAN Carrie King is a 64 y.o. female with history of HTN, DB, stroke presenting with 1 week AMS.   Stroke:  Multiple acute and subacute B infra- and supratentorial infarcts  embolic pattern secondary to uncontrolled stroke risk factors  CT head R temporal lobe hypodensity. Chronic basal ganglia lacunes.   MRI  Medial R temporal lobe infarct w/ petechial hemorrhage. L pons, R thalamic, L caudate body, L periatrial white matter acute and subacute infarcts.    CTA head & neck no LVO. Aortic atherosclerosis. B ICA bifurcation atherosclerosis, B siphons 50%. Distal MCA atherosclerosis. R VA 30% or origin and cervical. 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 BA mild atherosclerosis. B PCA atherosclerosis. - overall unchanged since 05/2018  Loop interrogation no afib   EEG intermittent rhythmic slowing, no sz  2D Echo pending   LDL 168  HgbA1c 12.5  CSF WBC 2, protein 40, HSV PCR pending  Lovenox 40 mg sq daily for VTE prophylaxis  aspirin 81 mg daily prior to admission, now on aspirin 81 mg daily and plavix 75mg  daily DAPT for 3 weeks and the plavix alone.   Therapy recommendations:  HH PT  Disposition:  pending   Hx stroke/TIA  05/2018 - MRI left lateral medullary infarct and right ACA punctate infarct - embolic pattern with unclear source.  CTA head and neck right VA, bilateral siphon, right M2 and P2 stenosis.  EF 60 to 65%.  Negative for DVT.  LDL 134 and A1c 7.3.  UDS positive for THC.  DAPT x 3 wks then aspirin alone. Loop placed. Resultant memory difficulties  Hypertensive urgency  BP as high as 197/112 . Permissive hypertension (OK if <180/105) but gradually normalize in 3-5 days . BP stable 160-180 . Long-term BP goal normotensive  Hyperlipidemia  Home meds:  lipitor 40, resumed in hospital  LDL 168, goal < 70  Increase lipitor to 80mg    Continue statin at discharge  Diabetes type II Uncontrolled  HgbA1c 12.5, goal < 7.0  CBGs  SSI  On lantus  Hyperglycemia  Close PCP follow-up for better DM control  Tobacco abuse  Current smoker  Smoking cessation counseling provided  Pt is willing to quit  Other Stroke Risk Factors  Substance abuse  - THC. UDS:  THC POSITIVE. Patient advised to stop using due to stroke risk.  Overweight, Body mass index is 28.53 kg/m., recommend weight loss, diet and exercise as appropriate   Other Active Problems  Acute metaboplic encephalopathy  Elevated toponin  Hypokalemia  Anxiety and depression  Chronic pain  Hospital day # 1  Neurology will sign off. Please call with questions. Pt will follow up with stroke clinic Ms. McCue at [facility] in about 4 weeks. Thanks for the consult.   Marvel Plan, MD PhD Stroke Neurology 06/17/2019 12:44 PM  To contact Stroke Continuity provider, please refer to [email or web address]. After hours, contact General Neurology

## 2019-06-18 LAB — GLUCOSE, CAPILLARY
Glucose-Capillary: 183 mg/dL — ABNORMAL HIGH (ref 70–99)
Glucose-Capillary: 355 mg/dL — ABNORMAL HIGH (ref 70–99)

## 2019-06-18 MED ORDER — INSULIN GLARGINE 100 UNIT/ML SOLOSTAR PEN
10.0000 [IU] | PEN_INJECTOR | Freq: Every day | SUBCUTANEOUS | 0 refills | Status: AC
Start: 2019-06-18 — End: 2019-09-16

## 2019-06-18 MED ORDER — GLIMEPIRIDE 1 MG PO TABS: 1.0000 mg | ORAL_TABLET | Freq: Every day | ORAL | 0 refills | Status: DC

## 2019-06-18 MED ORDER — LISINOPRIL 10 MG PO TABS: 10.0000 mg | ORAL_TABLET | Freq: Every day | ORAL | 0 refills | Status: AC

## 2019-06-18 MED ORDER — NICOTINE 21 MG/24HR TD PT24: 21.0000 mg | MEDICATED_PATCH | Freq: Every day | TRANSDERMAL | 0 refills | Status: DC

## 2019-06-18 MED ORDER — CLOPIDOGREL BISULFATE 75 MG PO TABS: 75.0000 mg | ORAL_TABLET | Freq: Every day | ORAL | 0 refills | Status: AC

## 2019-06-18 MED ORDER — ATORVASTATIN CALCIUM 40 MG PO TABS: 40.0000 mg | ORAL_TABLET | Freq: Every day | ORAL | 0 refills | Status: DC

## 2019-06-18 MED ORDER — ASPIRIN 81 MG PO TBEC: 81.0000 mg | DELAYED_RELEASE_TABLET | Freq: Every day | ORAL | 0 refills | Status: DC

## 2019-06-18 NOTE — Plan of Care (Signed)
  Problem: Education: Goal: Knowledge of disease or condition will improve Outcome: Progressing Goal: Knowledge of secondary prevention will improve Outcome: Progressing Goal: Knowledge of patient specific risk factors addressed and post discharge goals established will improve Outcome: Progressing Goal: Individualized Educational Video(s) Outcome: Progressing   Problem: Coping: Goal: Will verbalize positive feelings about self Outcome: Progressing Goal: Will identify appropriate support needs Outcome: Progressing   Problem: Health Behavior/Discharge Planning: Goal: Ability to manage health-related needs will improve Outcome: Progressing   Problem: Self-Care: Goal: Ability to participate in self-care as condition permits will improve Outcome: Progressing Goal: Verbalization of feelings and concerns over difficulty with self-care will improve Outcome: Progressing Goal: Ability to communicate needs accurately will improve Outcome: Progressing   Problem: Nutrition: Goal: Risk of aspiration will decrease Outcome: Progressing Goal: Dietary intake will improve Outcome: Progressing   Problem: Intracerebral Hemorrhage Tissue Perfusion: Goal: Complications of Intracerebral Hemorrhage will be minimized Outcome: Progressing   Problem: Ischemic Stroke/TIA Tissue Perfusion: Goal: Complications of ischemic stroke/TIA will be minimized Outcome: Progressing   Problem: Spontaneous Subarachnoid Hemorrhage Tissue Perfusion: Goal: Complications of Spontaneous Subarachnoid Hemorrhage will be minimized Outcome: Progressing   Problem: Education: Goal: Knowledge of General Education information will improve Description: Including pain rating scale, medication(s)/side effects and non-pharmacologic comfort measures Outcome: Progressing   Problem: Health Behavior/Discharge Planning: Goal: Ability to manage health-related needs will improve Outcome: Progressing   Problem: Clinical  Measurements: Goal: Ability to maintain clinical measurements within normal limits will improve Outcome: Progressing Goal: Will remain free from infection Outcome: Progressing Goal: Diagnostic test results will improve Outcome: Progressing Goal: Respiratory complications will improve Outcome: Progressing Goal: Cardiovascular complication will be avoided Outcome: Progressing   Problem: Activity: Goal: Risk for activity intolerance will decrease Outcome: Progressing   Problem: Nutrition: Goal: Adequate nutrition will be maintained Outcome: Progressing   Problem: Coping: Goal: Level of anxiety will decrease Outcome: Progressing   Problem: Elimination: Goal: Will not experience complications related to bowel motility Outcome: Progressing Goal: Will not experience complications related to urinary retention Outcome: Progressing   Problem: Pain Managment: Goal: General experience of comfort will improve Outcome: Progressing   Problem: Safety: Goal: Ability to remain free from injury will improve Outcome: Progressing   Problem: Skin Integrity: Goal: Risk for impaired skin integrity will decrease Outcome: Progressing   

## 2019-06-18 NOTE — TOC CAGE-AID Note (Signed)
Transition of Care [facility]) - CAGE-AID Screening   Patient Details  Name: Carrie King MRN: [id number] Date of Birth: 09-Jul-1955  Transition of Care [facility]) CM/SW Contact:    Jimmy Picket, [place] Phone Number: 06/18/2019, 12:17 PM   Clinical Narrative: Pt denied alcohol use. Pt reported occasional marijuana use. Pt reports no interest in stopping. Pt denied resources.    CAGE-AID Screening:    Have You Ever Felt You Ought to Cut Down on Your Drinking or Drug Use?: No Have People Annoyed You By Critizing Your Drinking Or Drug Use?: No Have You Felt Bad Or Guilty About Your Drinking Or Drug Use?: No Have You Ever Had a Drink or Used Drugs First Thing In The Morning to STeady Your Nerves or to Get Rid of a Hangover?: No CAGE-AID Score: 0  Substance Abuse Education Offered: No     Denita Lung, Bridget Hartshorn Clinical Social Worker [phone]

## 2019-06-18 NOTE — TOC Transition Note (Signed)
Transition of Care [facility]) - CM/SW Discharge Note   Patient Details  Name: Carrie King MRN: [id number] Date of Birth: September 12, 1955  Transition of Care [facility]) CM/SW Contact:  Kermit Balo, RN Phone Number: 06/18/2019, 11:58 AM   Clinical Narrative:    Pt discharging home and will have outpatient therapy at [facility]. Information on the AVS.  Pt has DME for home at the bedside.  Family to provide transport home.   Final next level of care: OP Rehab Barriers to Discharge: No Barriers Identified   Patient Goals and CMS Choice     Choice offered to / list presented to : Patient  Discharge Placement                       Discharge Plan and Services   Discharge Planning Services: CM Consult            DME Arranged: 3-N-1, Walker rolling DME Agency: [employer] Date DME Agency Contacted: 06/17/19   Representative spoke with at DME Agency: Zack            Social Determinants of Health (SDOH) Interventions     Readmission Risk Interventions Readmission Risk Prevention Plan 05/20/2018  Post Dischage Appt Complete  Medication Screening Complete  Transportation Screening Complete  Some recent data might be hidden

## 2019-06-18 NOTE — Discharge Instructions (Signed)
You have been started on aspirin 81 mg daily and Plavix 75 mg daily. After 3 months, you will stop aspirin take Plavix alone.

## 2019-06-18 NOTE — Discharge Summary (Signed)
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 Stain   Final    WBC PRESENT, PREDOMINANTLY MONONUCLEAR NO ORGANISMS SEEN CYTOSPIN SMEAR RESULT CALLED TO, READ BACK BY AND VERIFIED WITH: ROSETTE BELLAVANCE RN 06/16/19 2951 JDW    Culture   Final    NO GROWTH 2 DAYS Performed at [facility], [address], [place], [place] [address]    Report Status PENDING  Incomplete  SARS Coronavirus 2 by RT PCR (hospital order, performed in [facility] hospital lab) Nasopharyngeal Nasopharyngeal Swab     Status: None   Collection Time: 06/16/19  9:03 AM   Specimen: Nasopharyngeal Swab  Result Value Ref Range Status   SARS Coronavirus 2 NEGATIVE NEGATIVE Final    Comment: (NOTE) SARS-CoV-2 target nucleic acids are NOT DETECTED. The SARS-CoV-2 RNA is generally detectable in upper and lower respiratory specimens during the acute phase of infection. The lowest concentration of SARS-CoV-2 viral  copies this assay can detect  is 250 copies / mL. A negative result does not preclude SARS-CoV-2 infection and should not be used as the sole basis for treatment or other patient management decisions.  A negative result may occur with improper specimen collection / handling, submission of specimen other than nasopharyngeal swab, presence of viral mutation(s) within the areas targeted by this assay, and inadequate number of viral copies (<250 copies / mL). A negative result must be combined with clinical observations, patient history, and epidemiological information. Fact Sheet for Patients:   BoilerBrush.com.cyhttps://www.fda.gov/media/136312/download Fact Sheet for Healthcare Providers: https://pope.com/https://www.fda.gov/media/136313/download This test is not yet approved or cleared  by the [place] FDA and has been authorized for detection and/or diagnosis of SARS-CoV-2 by FDA under an Emergency Use Authorization (EUA).  This EUA will remain in effect (meaning this test can be used) for the duration of the COVID-19 declaration under Section 564(b)(1) of the Act, 21 U.S.C. section 360bbb-3(b)(1), unless the authorization is terminated or revoked sooner. Performed at [facility], [address] [place], [place] [address]   Culture, blood (routine x 2)     Status: None (Preliminary result)   Collection Time: 06/16/19  9:18 AM   Specimen: BLOOD RIGHT FOREARM  Result Value Ref Range Status   Specimen Description BLOOD RIGHT FOREARM  Final   Special Requests   Final    BOTTLES DRAWN AEROBIC AND ANAEROBIC Blood Culture results may not be optimal due to an inadequate volume of blood received in culture bottles   Culture   Final    NO GROWTH 2 DAYS Performed at [facility], [address] [place], [place] [address]    Report Status PENDING  Incomplete  Culture, blood (routine x 2)     Status: None (Preliminary result)   Collection Time: 06/16/19  9:28 AM   Specimen: BLOOD LEFT HAND  Result Value  Ref Range Status   Specimen Description BLOOD LEFT HAND  Final   Special Requests   Final    BOTTLES DRAWN AEROBIC AND ANAEROBIC Blood Culture adequate volume   Culture   Final    NO GROWTH 2 DAYS Performed at [facility], [address] [place], [place] [address]    Report Status PENDING  Incomplete    Please note: You were cared for by a hospitalist during your hospital stay. Once you are discharged, your primary care physician will handle any further medical issues. Please note that NO REFILLS for any discharge medications will be authorized once you are discharged, as it is imperative that you return to your primary care physician (or establish a relationship with a primary care physician if you do not have one) for your post hospital discharge needs so that they can reassess your need for medications and monitor your lab values.  Signed: Lorin GlassBinaya Kendall Justo  [facility] 06/18/2019, 11:24 AM

## 2019-06-19 LAB — CSF CULTURE W GRAM STAIN
Culture: NO GROWTH
Special Requests: NORMAL

## 2019-06-21 ENCOUNTER — Telehealth: Payer: Self-pay

## 2019-06-21 LAB — CULTURE, BLOOD (ROUTINE X 2)
Culture: NO GROWTH
Culture: NO GROWTH
Special Requests: ADEQUATE

## 2019-06-21 NOTE — Telephone Encounter (Signed)
Transition Care Management Follow-up Telephone Call Date of discharge and from where: 06/16/2019, [facility].  Call placed to patient # [phone], spoke to her husband, [place], who stated that she was still asleep. He said that he would have her call back when she wakes up.  Call back number provided.

## 2019-06-22 ENCOUNTER — Emergency Department (HOSPITAL_COMMUNITY): Payer: Medicaid Other

## 2019-06-22 ENCOUNTER — Other Ambulatory Visit: Payer: Self-pay

## 2019-06-22 ENCOUNTER — Emergency Department (HOSPITAL_COMMUNITY)
Admission: EM | Admit: 2019-06-22 | Discharge: 2019-06-23 | Disposition: A | Discharge status: Home / Self Care | Payer: Medicaid Other | Attending: Emergency Medicine | Admitting: Emergency Medicine

## 2019-06-22 DIAGNOSIS — Z7901 Long term (current) use of anticoagulants: Secondary | ICD-10-CM | POA: Diagnosis not present

## 2019-06-22 DIAGNOSIS — Z7982 Long term (current) use of aspirin: Secondary | ICD-10-CM | POA: Insufficient documentation

## 2019-06-22 DIAGNOSIS — I10 Essential (primary) hypertension: Secondary | ICD-10-CM | POA: Diagnosis not present

## 2019-06-22 DIAGNOSIS — R4182 Altered mental status, unspecified: Secondary | ICD-10-CM | POA: Diagnosis present

## 2019-06-22 DIAGNOSIS — Z794 Long term (current) use of insulin: Secondary | ICD-10-CM | POA: Diagnosis not present

## 2019-06-22 DIAGNOSIS — I639 Cerebral infarction, unspecified: Secondary | ICD-10-CM | POA: Diagnosis not present

## 2019-06-22 DIAGNOSIS — E119 Type 2 diabetes mellitus without complications: Secondary | ICD-10-CM | POA: Insufficient documentation

## 2019-06-22 DIAGNOSIS — Z79899 Other long term (current) drug therapy: Secondary | ICD-10-CM | POA: Diagnosis not present

## 2019-06-22 LAB — DIFFERENTIAL
Abs Immature Granulocytes: 0.01 10*3/uL (ref 0.00–0.07)
Basophils Absolute: 0 10*3/uL (ref 0.0–0.1)
Basophils Relative: 1 %
Eosinophils Absolute: 0.1 10*3/uL (ref 0.0–0.5)
Eosinophils Relative: 1 %
Immature Granulocytes: 0 %
Lymphocytes Relative: 25 %
Lymphs Abs: 1.3 10*3/uL (ref 0.7–4.0)
Monocytes Absolute: 0.3 10*3/uL (ref 0.1–1.0)
Monocytes Relative: 5 %
Neutro Abs: 3.4 10*3/uL (ref 1.7–7.7)
Neutrophils Relative %: 68 %

## 2019-06-22 LAB — COMPREHENSIVE METABOLIC PANEL
ALT: 13 U/L (ref 0–44)
AST: 17 U/L (ref 15–41)
Albumin: 3.5 g/dL (ref 3.5–5.0)
Alkaline Phosphatase: 89 U/L (ref 38–126)
Anion gap: 12 (ref 5–15)
BUN: 20 mg/dL (ref 8–23)
CO2: 25 mmol/L (ref 22–32)
Calcium: 9.4 mg/dL (ref 8.9–10.3)
Chloride: 100 mmol/L (ref 98–111)
Creatinine, Ser: 0.7 mg/dL (ref 0.44–1.00)
GFR calc Af Amer: >60 mL/min (ref >60)
GFR calc non Af Amer: >60 mL/min (ref >60)
Glucose, Bld: 234 mg/dL — ABNORMAL HIGH (ref 70–99)
Potassium: 3.9 mmol/L (ref 3.5–5.1)
Sodium: 137 mmol/L (ref 135–145)
Total Bilirubin: 1 mg/dL (ref 0.3–1.2)
Total Protein: 6.9 g/dL (ref 6.5–8.1)

## 2019-06-22 LAB — PROTIME-INR
INR: 1 (ref 0.8–1.2)
Prothrombin Time: 12.4 seconds (ref 11.4–15.2)

## 2019-06-22 LAB — CBC
HCT: 41.1 % (ref 36.0–46.0)
Hemoglobin: 13.9 g/dL (ref 12.0–15.0)
MCH: 30.7 pg (ref 26.0–34.0)
MCHC: 33.8 g/dL (ref 30.0–36.0)
MCV: 90.7 fL (ref 80.0–100.0)
Platelets: 287 10*3/uL (ref 150–400)
RBC: 4.53 MIL/uL (ref 3.87–5.11)
RDW: 12.5 % (ref 11.5–15.5)
WBC: 5.1 10*3/uL (ref 4.0–10.5)
nRBC: 0 % (ref 0.0–0.2)

## 2019-06-22 LAB — APTT: aPTT: 29 seconds (ref 24–36)

## 2019-06-22 IMAGING — MR MR HEAD W/O CM
7 of 11 series · 25 of 48 positions shown · non-contrast
Comparison: Brain MRI [DATE]

CLINICAL DATA: Encephalopathy. Recent stroke.

EXAM:
MRI HEAD WITHOUT CONTRAST
TECHNIQUE: Multiplanar, multiecho pulse sequences of the brain and surrounding
structures were obtained without intravenous contrast.

[Series 3: DWI · axial · 3.0mm · 0.94mm/px · z∈[-78,+69]mm · 7 of 100 slices shown (1 of 2)]
[im 1/100]
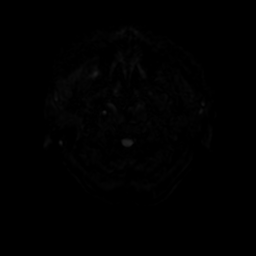
[im 17/100]
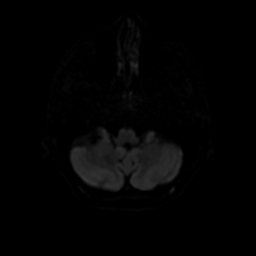
[im 34/100]
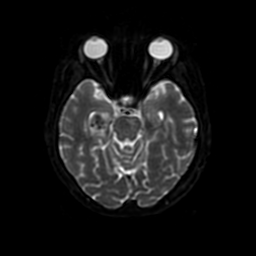
[im 50/100]
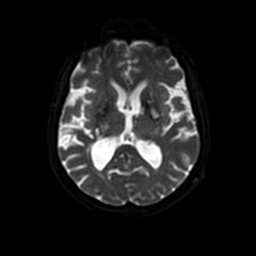
[im 67/100]
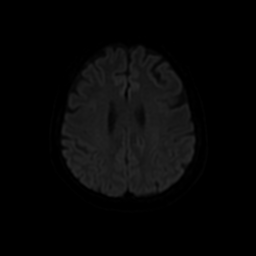
[im 83/100]
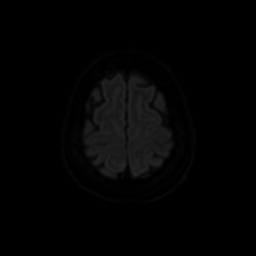
[im 100/100]
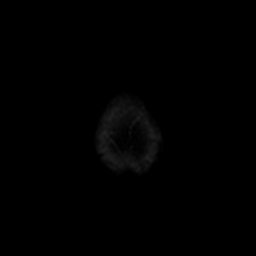

[Series 4: DWI · coronal · 4.0mm · 0.94mm/px · 6 of 74 slices shown (2 of 2)]
[im 1/74]
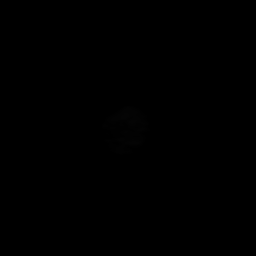
[im 15/74]
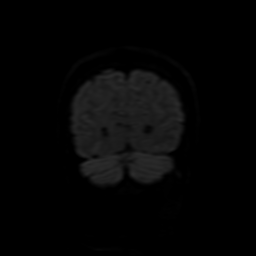
[im 30/74]
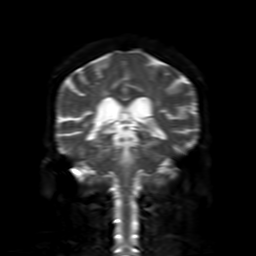
[im 44/74]
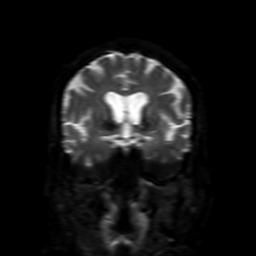
[im 59/74]
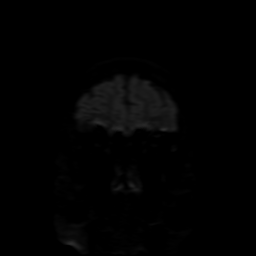
[im 74/74]
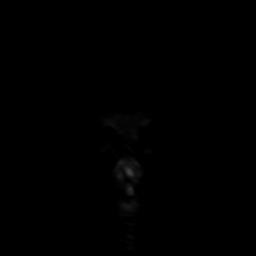

[Series 5: FLAIR · sagittal · 5.0mm · 0.23mm/px · 2 of 26 slices shown (1 of 2)]
[im 1/26]
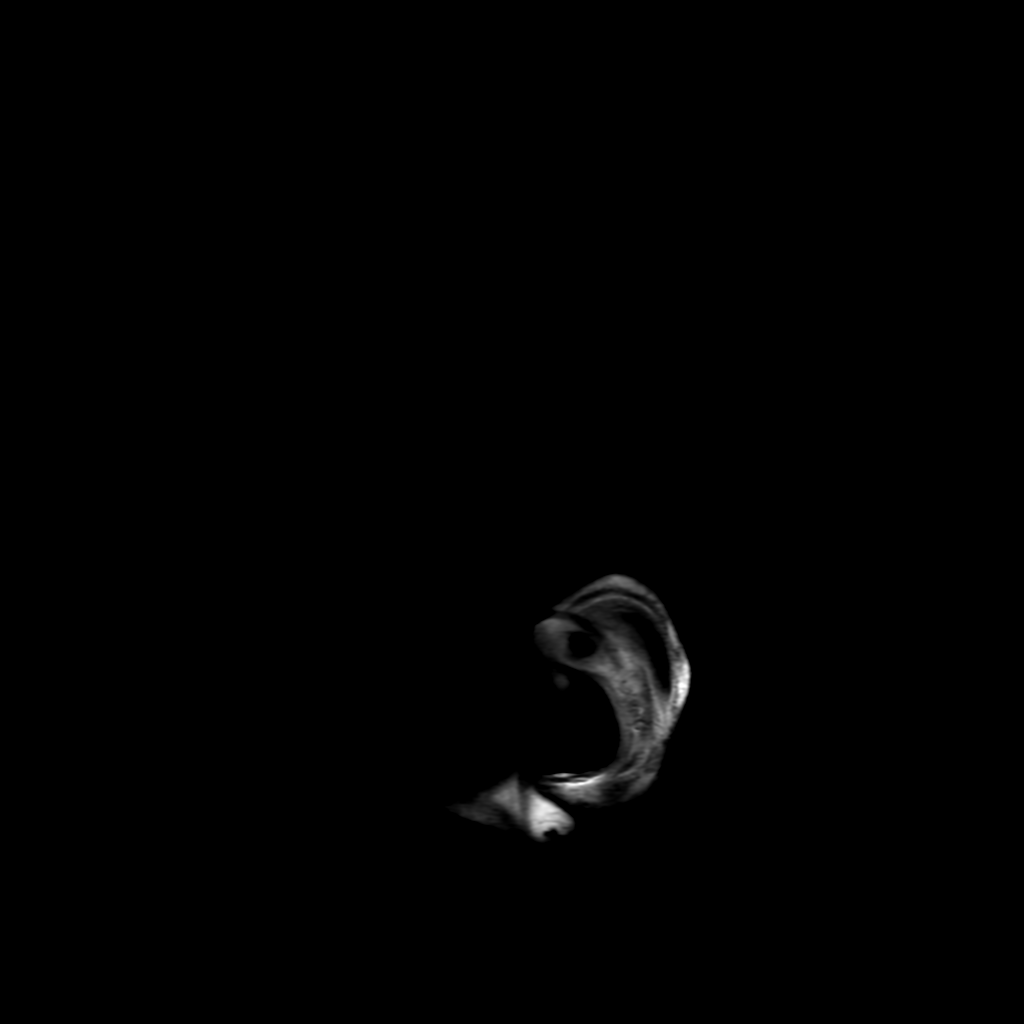
[im 26/26]
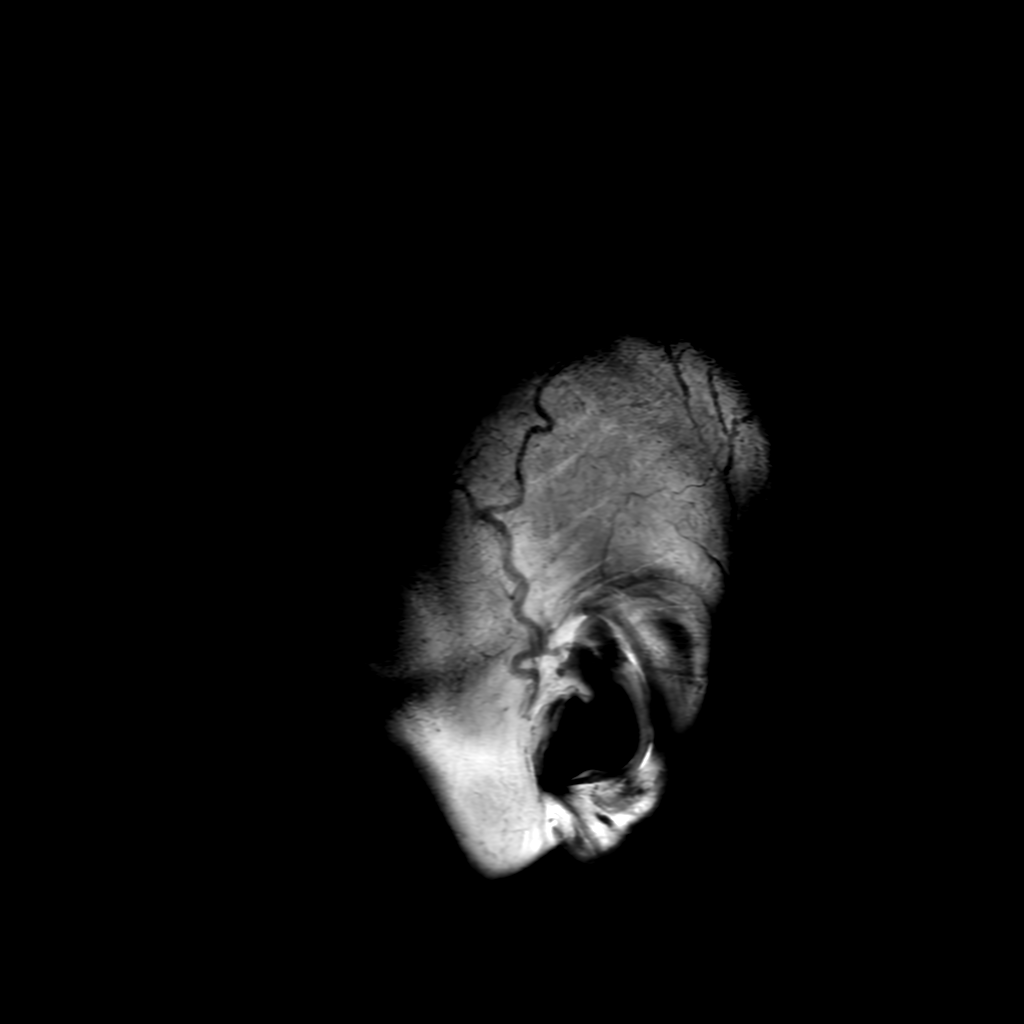

[Series 6: T2 · axial · 5.0mm · 0.23mm/px · 1 of 27 slices shown]
[im 1/27]
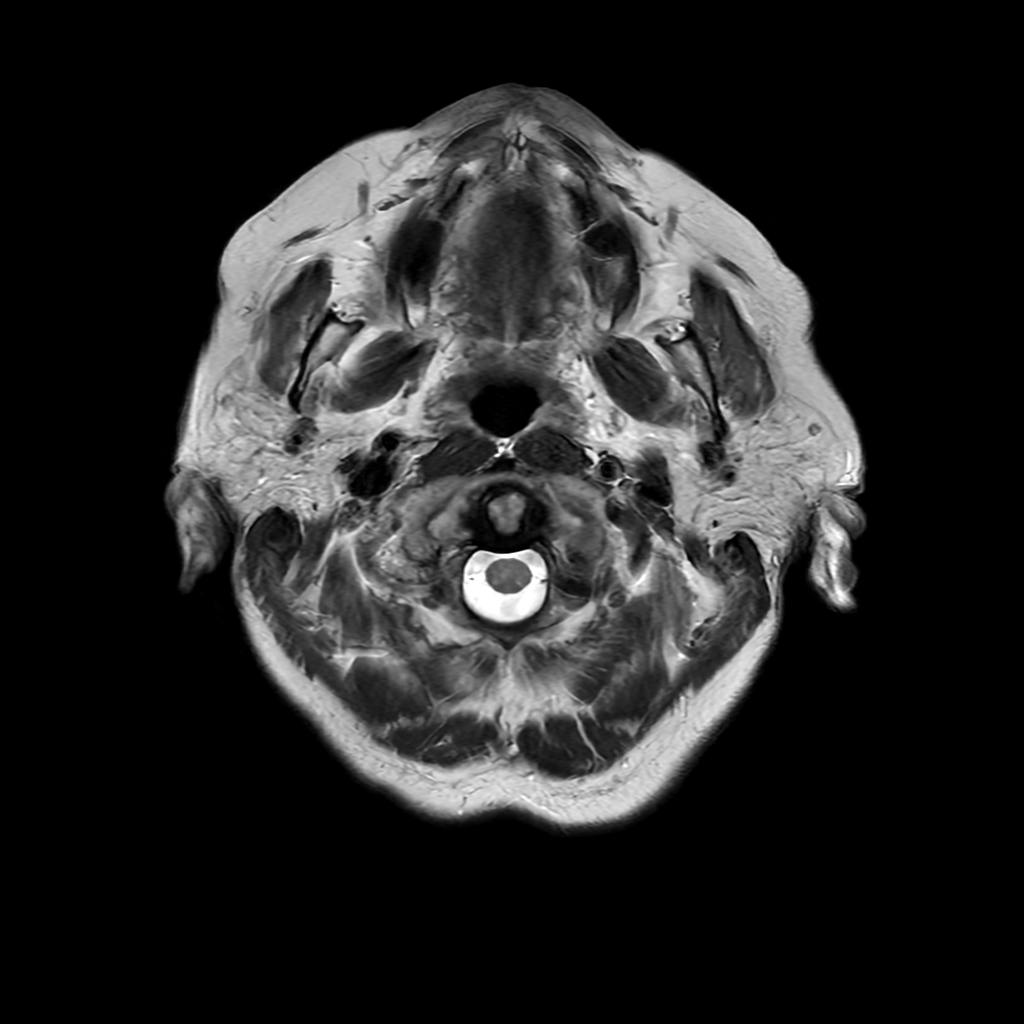

[Series 7: FLAIR · axial · 3.0mm · 0.41mm/px · z∈[-77,+79]mm · 2 of 27 slices shown (2 of 2)]
[im 1/27]
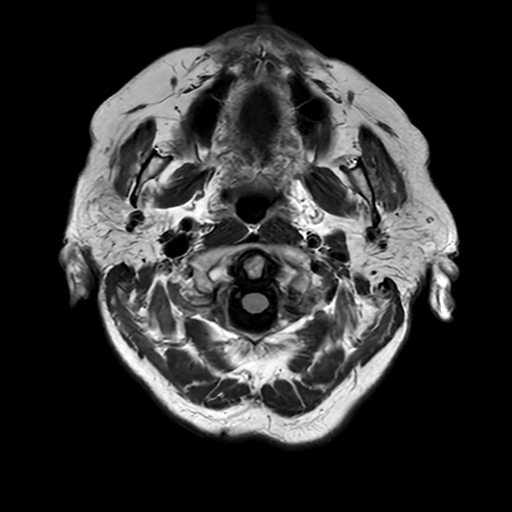
[im 27/27]
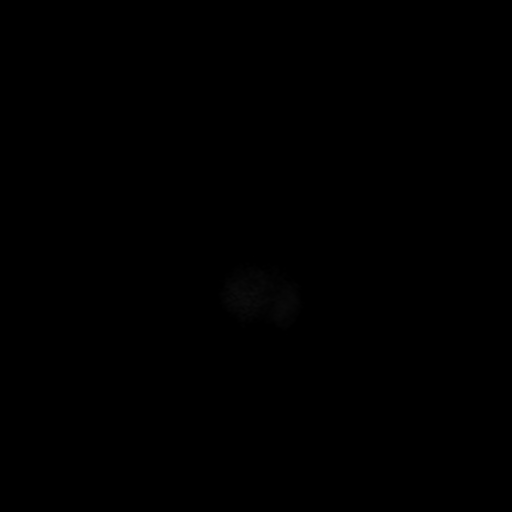

[Series 350: ADC · axial · 3.0mm · 0.94mm/px · z∈[-78,+69]mm · 4 of 50 slices shown (1 of 2)]
[im 1/50]
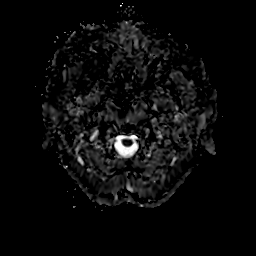
[im 17/50]
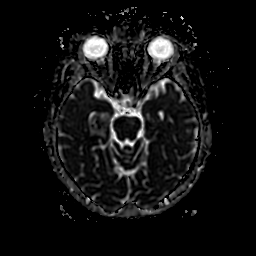
[im 33/50]
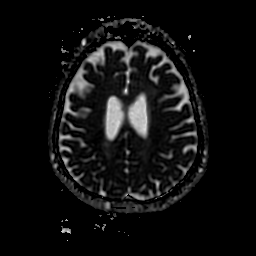
[im 50/50]
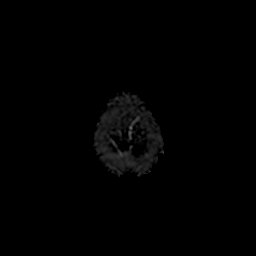

[Series 450: ADC · coronal · 4.0mm · 0.94mm/px · 3 of 37 slices shown (2 of 2)]
[im 1/37]
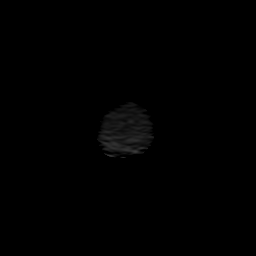
[im 19/37]
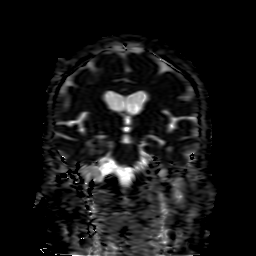
[im 37/37]
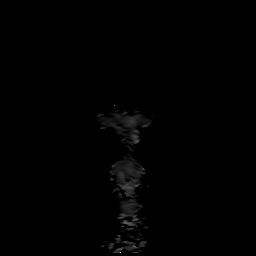

[25 of 48 positions shown; findings below may reference images not displayed]

FINDINGS: Brain: There is a new acute infarct of the left basal ganglia. Other
areas of subacute ischemia within both cerebral hemispheres and the
brainstem show expected evolution of diffusion weighted changes. No
acute hemorrhage. Multifocal white matter hyperintensity, most
commonly due to chronic ischemic microangiopathy. Normal volume of
CSF spaces. Unchanged appearance of blood products at the right
medial temporal lobe. Normal midline structures.

Vascular: Normal flow voids.

Skull and upper cervical spine: Normal marrow signal.

Sinuses/Orbits: Negative.

Other: None.
IMPRESSION: 1. New acute infarct of the left basal ganglia. No hemorrhage or
mass effect.
2. Expected evolution of multiple subacute infarcts within both
cerebral hemispheres and the brainstem.
3. Unchanged appearance of blood products at the right medial
temporal lobe.

## 2019-06-22 IMAGING — CT CT HEAD W/O CM
4 series · 16 of 47 positions shown, 18 images · non-contrast
Comparison: [DATE]

CLINICAL DATA: Sleepy.

EXAM:
CT HEAD WITHOUT CONTRAST
TECHNIQUE: Contiguous axial images were obtained from the base of the skull
through the vertex without intravenous contrast.

[Series 3: head without · axial · non-contrast · 0.42mm/px · z∈[-48,+72]mm · 7 of 34 slices shown, 9 images]
[im 5/34  brain]
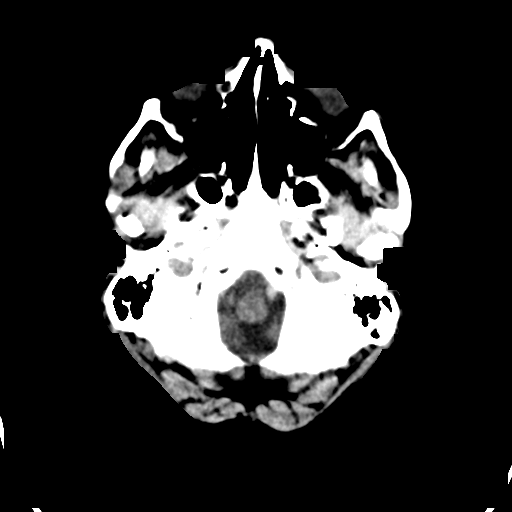
[im 5/34  bone]
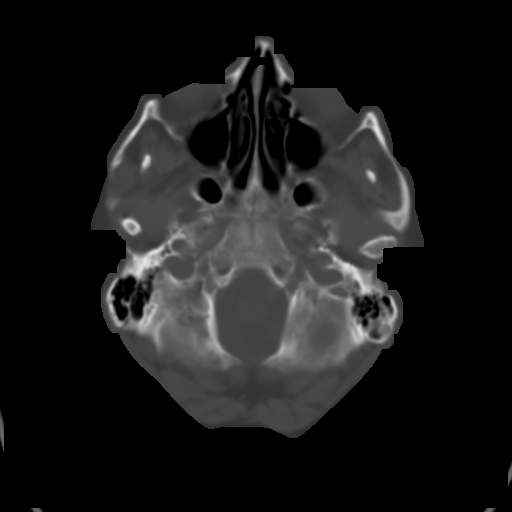
[im 9/34  brain]
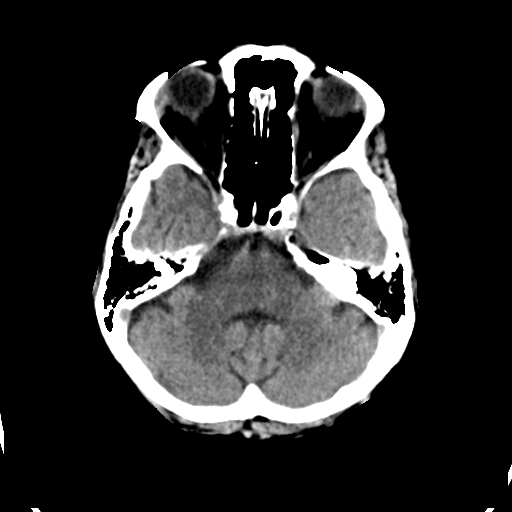
[im 13/34  brain]
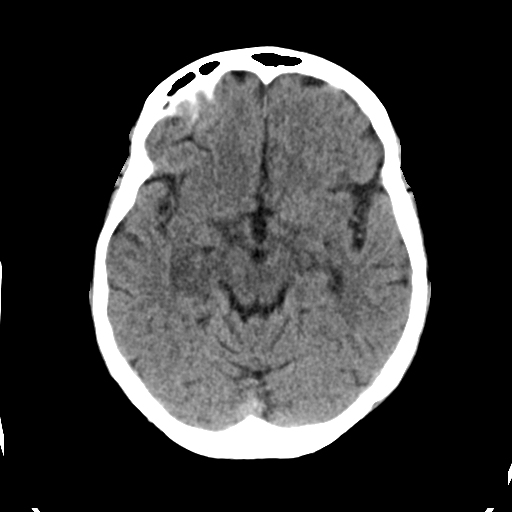
[im 17/34  brain]
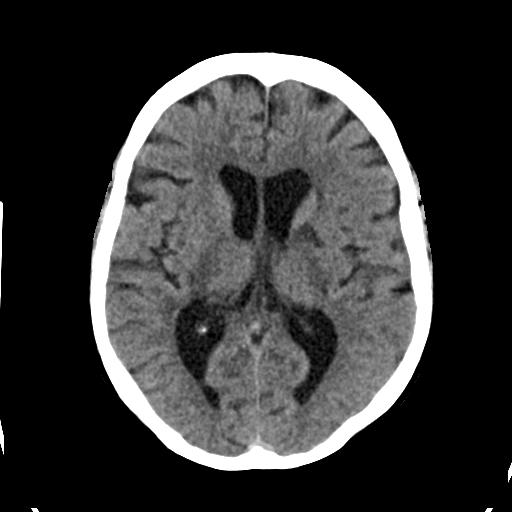
[im 21/34  brain]
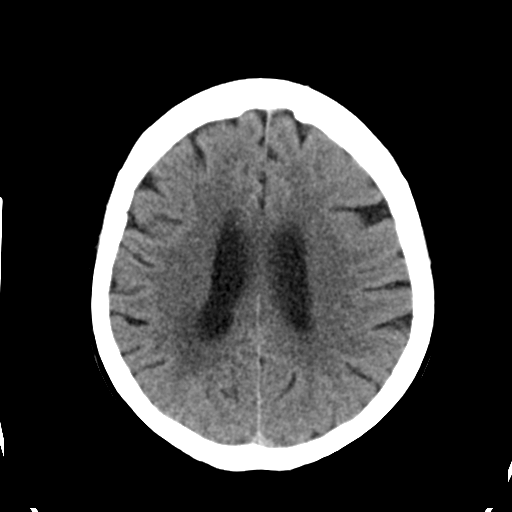
[im 21/34  bone]
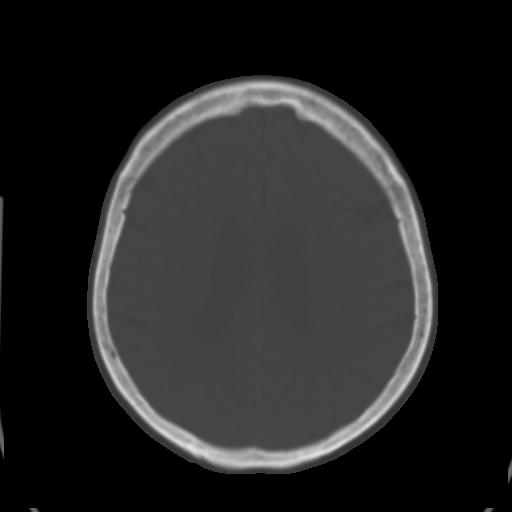
[im 25/34  brain]
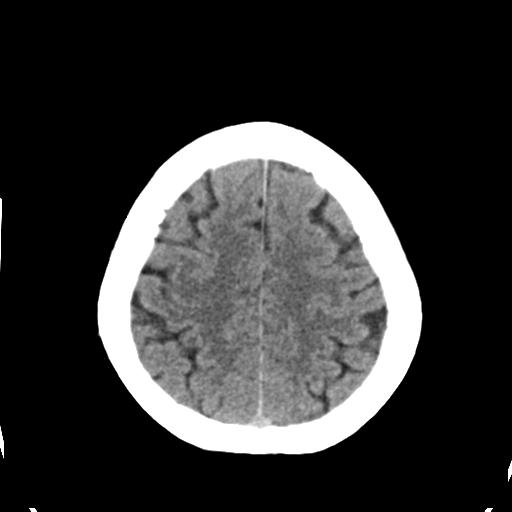
[im 29/34  brain]
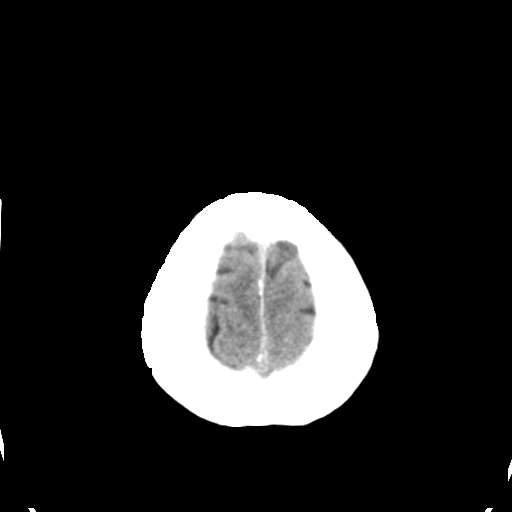

[Series 4: head bone · axial · 0.42mm/px · z∈[-52,-18]mm · 3 of 85 slices shown]
[im 9/85  bone]
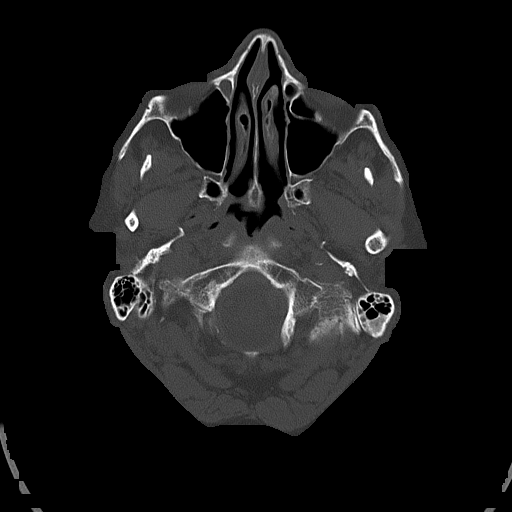
[im 17/85  bone]
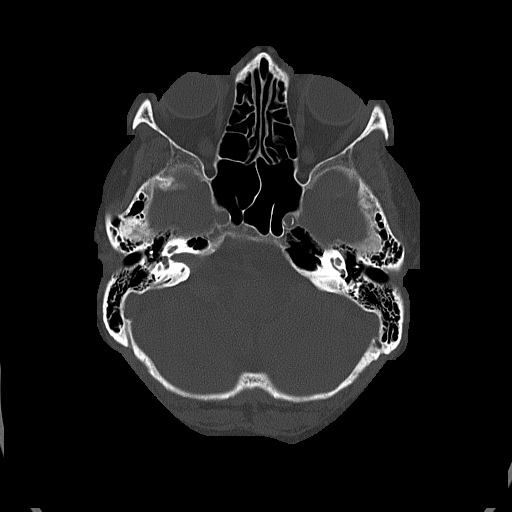
[im 26/85  bone]
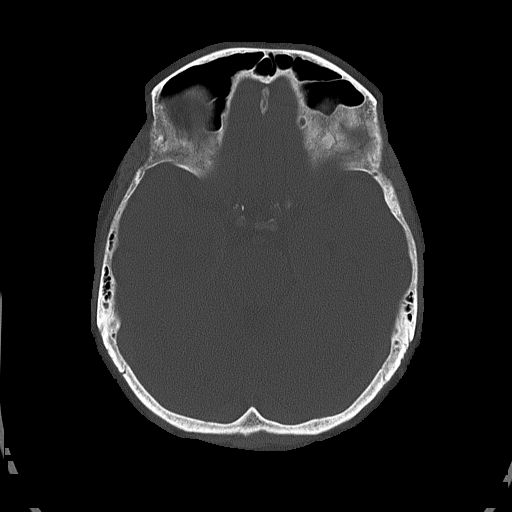

[Series 5: head without cor · coronal · non-contrast · 0.31mm/px · 3 of 67 slices shown]
[im 23/67  brain]
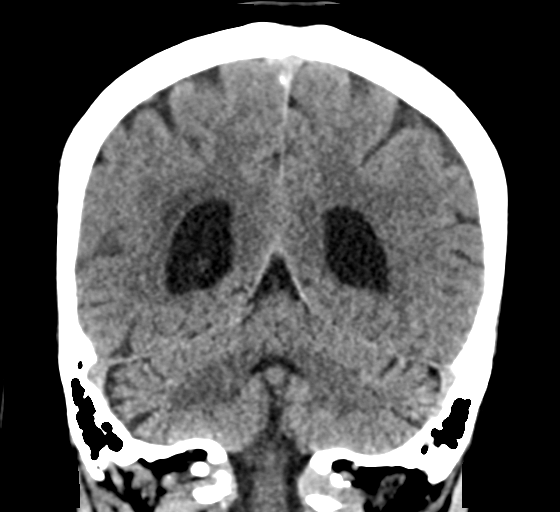
[im 30/67  brain]
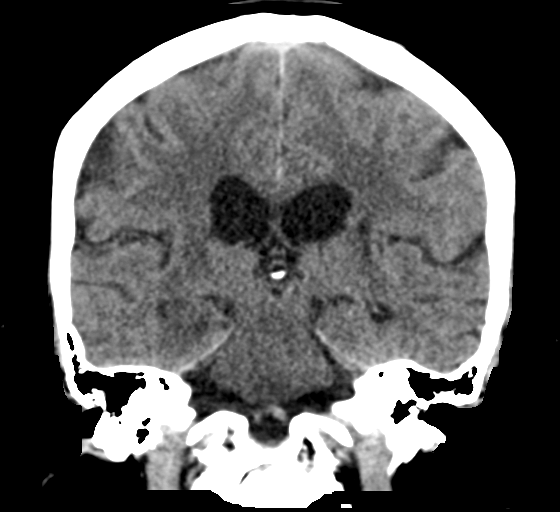
[im 37/67  brain]
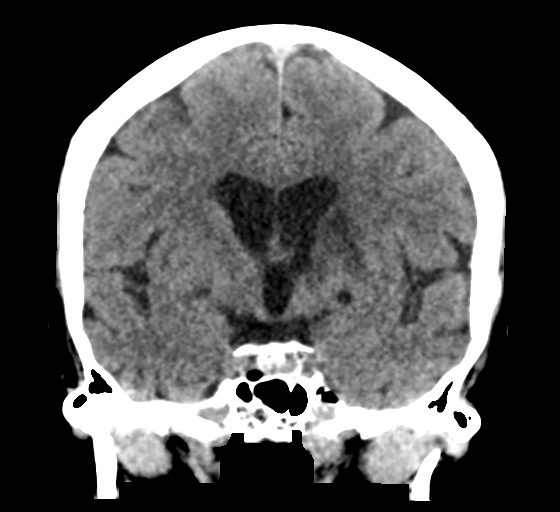

[Series 6: head without sag · sagittal · non-contrast · 0.32mm/px · 3 of 56 slices shown]
[im 19/56  brain]
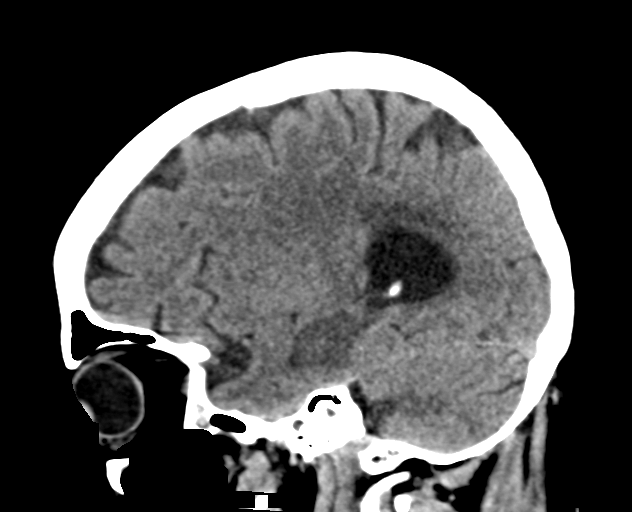
[im 28/56  brain]
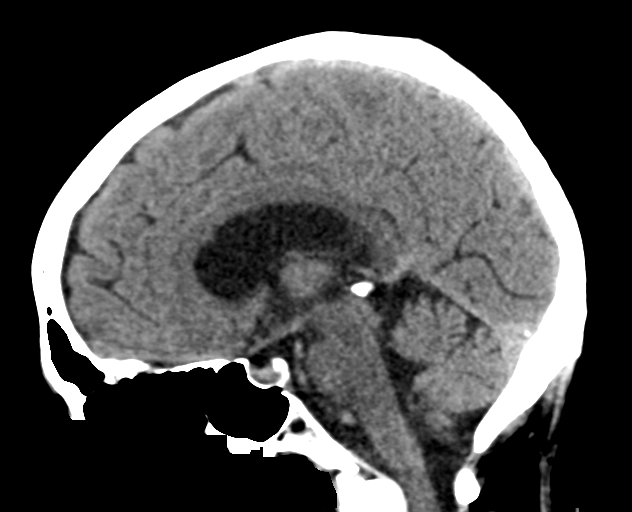
[im 37/56  brain]
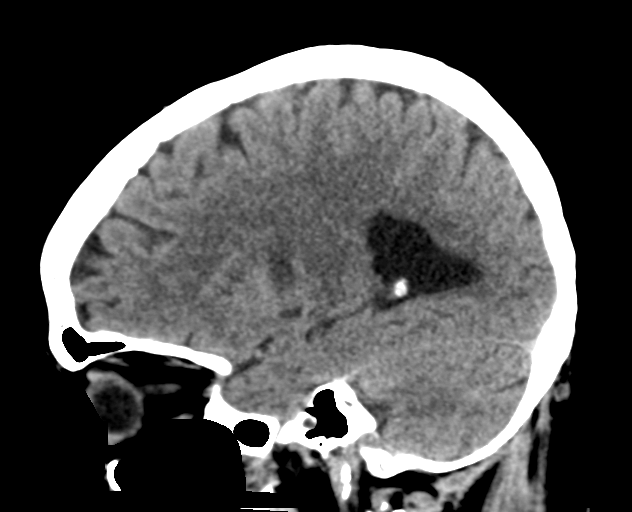

[16 of 47 positions shown; findings below may reference images not displayed]

FINDINGS: Brain: There is mild cerebral atrophy with widening of the
extra-axial spaces and ventricular dilatation.
There are areas of decreased attenuation within the white matter
tracts of the supratentorial brain, consistent with microvascular
disease changes.

A stable area of white matter low attenuation is seen within the
posteromedial aspect of the right temporal lobe.

A 1.4 cm x 0.9 cm area of white matter low attenuation is seen
within the basal ganglia on the left (axial CT image 17, CT series
3. This is increased in size when compared to the prior study.

Small chronic bilateral basal ganglia lacunar infarcts are seen.

Vascular: No hyperdense vessel or unexpected calcification.

Skull: Normal. Negative for fracture or focal lesion.

Sinuses/Orbits: No acute finding.

Other: None.
IMPRESSION: 1. Mild cerebral atrophy and microvascular disease changes of the
supratentorial brain.
2. Stable area of white matter low attenuation within the
posteromedial aspect of the right temporal lobe which is consistent
with the patient's known acute infarct.
3. Additional findings consistent with an evolving acute/subacute
left basal ganglia infarct. MRI correlation is recommended.

## 2019-06-22 MED ORDER — LORAZEPAM 1 MG PO TABS
1.0000 mg | ORAL_TABLET | Freq: Once | ORAL | Status: AC
  Administered 2019-06-22: 1 mg via ORAL
  Filled 2019-06-22: qty 1

## 2019-06-22 MED ORDER — SODIUM CHLORIDE 0.9% FLUSH: 3.0000 mL | Freq: Once | INTRAVENOUS | Status: DC

## 2019-06-22 NOTE — ED Triage Notes (Signed)
Pt sent by PCP for further evaluation of AMS and weakness. Pt recently discharged this week from having a stroke, family reports pt has been sleeping more and has had new difficulty walking. Pt a.o, has no complaints.

## 2019-06-22 NOTE — Telephone Encounter (Signed)
Transition Care Management Follow-up Telephone Call  Call completed with patient's daughter, Dorathy Kinsman # [phone]. DPR on file to speak with her.     Date of discharge and from where: 06/18/2019, [facility]  How have you been since you were released from the hospital? Amber said that he mother is happy to be home but they ( family ) have concerns about her.  She explained that her mother wants to sleep all of the time. She and her family feel that her mother is not progressing and " taking steps backwards."  She said that her mother is exhibiting the symptoms that she had when they took her to the hospital when she had her stroke - her gait is shuffling and she is is not thinking clearly and slow to respond to questions. She noted that her mother looks confused and is having difficulty remembering recent events.  This CM explained multiple times  that her mother should be evaluated in the ED as these symptoms are concerning Amber said that she understood.  Any questions or concerns?  noted above.  Items Reviewed:  Did the pt receive and understand the discharge instructions provided?  Yes, family  is overseeing her mother's needs  Medications obtained and verified? Amber said that her mother has all of her medications. Amber explained that she sets up the medications for her mother in weekly medication boxes and her father had been giving her the meds; however, her mother has not been taking them as ordered.  She said that her father has observed her mother take the lantus every morning as directed. Amber did not have any questions about the medications. Her concern was regarding her mother's compliance and ability to follow through with the taking the meds.   Any new allergies since your discharge?  none reported   Do you have support at home?  lives with her husband who is disabled. Amber lives a about 2 miles away.    Other (ie: DME, Home Health, etc) no home health ordered.    Referral to outpatient PT was discussed at discharge.   The option of a referral for PCS was discussed. The patient will need to see a provider prior to placing the referral.   Resources in [facility] can be explored if the family is interested in more help at home.  Her daughter is requesting a home BP monitor and a glucometer.  The patient has been using her husband's glucometer.  This glucometer prescription was sent to [facility] pharmacy but her daughter is considering having the prescriptions sent to [facility]  Has RW but does not use it.   Functional Questionnaire: (I = Independent and D = Dependent) ADL's: family assists as needed. Her daughter said that she is independent with walking, bathing and dressing.    Follow up appointments reviewed:    PCP Hospital f/u appt confirmed?.Dr Delford Field 07/01/2019 @ 1030  Specialist Hospital f/u appt confirmed? needs appointment with neurology. Referral has been placed  Are transportation arrangements needed?  no, Amber provides transportation  If their condition worsens, is the pt aware to call  their PCP or go to the ED? yes  Was the patient provided with contact information for the PCP's office or ED?  Amber has the clinic phone number  Was the pt encouraged to call back with questions or concerns?  yes, Amber was encouraged to call back

## 2019-06-22 NOTE — Discharge Instructions (Addendum)
Follow-up with neurology 

## 2019-06-22 NOTE — ED Provider Notes (Signed)
[facility] EMERGENCY DEPARTMENT Provider Note   CSN: [id number] Arrival date & time: 06/22/19  1355     History Chief Complaint  Patient presents with  . Weakness  . Altered Mental Status    Carrie King is a 64 y.o. female.  HPI Patient presents for increased sleepiness.  Recent stroke and had been admitted to hospital.  Since going home she was doing well but then began to be more sleepy.  Has trouble getting up at noon.  Would like to take more naps and reporting more sleepy.  Also some mild confusion.  Patient's PCP had been contacted and patient told to come in for more evaluation.  Right patient had another stroke.  No fevers.  No trauma.  Has had good oral intake.  No headache.  No nausea or vomiting.  Has not really noticed any lateralizing symptoms.    Past Medical History:  Diagnosis Date  . Diabetes mellitus without complication (HCC)   . Hypertension   . Stroke (HCC)   . Tobacco abuse   . Uncontrolled diabetes mellitus (HCC) 05/16/2018    Patient Active Problem List   Diagnosis Date Noted  . Acute metabolic encephalopathy 06/16/2019  . Elevated troponin 06/16/2019  . Hypokalemia 06/16/2019  . HLD (hyperlipidemia) 06/16/2019  . CVA (cerebral vascular accident) (HCC) 06/16/2019  . Anxiety and depression 06/24/2018  . 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 Acute CVA (cerebrovascular accident) (HCC) 05/31/2018  . Hypertension 05/31/2018  . Rupture of left triceps tendon 05/20/2018  . Medial malleolar fracture 05/20/2018  . Diabetes mellitus (HCC) 05/16/2018  . Open bicondylar fracture of distal humerus, left, initial encounter 05/14/2018  . Left elbow fracture 05/14/2018  . Open Monteggia's fracture of left ulna, type IIIA, IIIB, or IIIC 05/14/2018    Past Surgical History:  Procedure Laterality Date  . EXTERNAL FIXATION ARM Left 05/14/2018   Procedure: IRRIGATION AND DEBRIDEMENT, EXTERNAL FIXATION LEFT ELBOW FRACTURE;  Surgeon:  Roby Lofts, MD;  Location: [facility] OR;  Service: Orthopedics;  Laterality: Left;  . I & D EXTREMITY Left 05/18/2018   Procedure: IRRIGATION AND DEBRIDEMENT EXTREMITY;  Surgeon: Roby Lofts, MD;  Location: [facility] OR;  Service: Orthopedics;  Laterality: Left;  . LOOP RECORDER INSERTION N/A 06/01/2018   Procedure: LOOP RECORDER INSERTION;  Surgeon: Marinus Maw, MD;  Location: [facility] INVASIVE CV LAB;  Service: Cardiovascular;  Laterality: N/A;  . ORIF HUMERUS FRACTURE Left 05/18/2018   Procedure: OPEN REDUCTION INTERNAL FIXATION (ORIF) DISTAL HUMERUS FRACTURE;  Surgeon: Roby Lofts, MD;  Location: [facility] OR;  Service: Orthopedics;  Laterality: Left;  . ORIF ULNAR FRACTURE Left 05/18/2018   Procedure: OPEN REDUCTION INTERNAL FIXATION (ORIF) ULNAR FRACTURE;  Surgeon: Roby Lofts, MD;  Location: [facility] OR;  Service: Orthopedics;  Laterality: Left;     OB History   No obstetric history on file.     Family History  Problem Relation Age of Onset  . Hypertension Mother   . Diabetes Mother   . Hypertension Father   . Heart attack Father     Social History   Tobacco Use  . Smoking status: Current Every Day Smoker    Packs/day: 1.00    Types: Cigarettes  . Smokeless tobacco: Former Neurosurgeon    Quit date: 05/14/2018  Substance Use Topics  . Alcohol use: Never  . Drug use: Yes    Types: Marijuana    Home Medications Prior to Admission medications   Medication Sig Start Date End  Date Taking? Authorizing Provider  acetaminophen (TYLENOL) 325 MG tablet Take 2 tablets (650 mg total) by mouth every 6 (six) hours as needed for mild pain (or Fever >/= 101). 06/11/18  Yes Angiulli, Mcarthur Rossetti, PA-C  aspirin 81 MG EC tablet Take 1 tablet (81 mg total) by mouth daily. 06/18/19 09/16/19 Yes Dahal, Melina Schools, MD  atorvastatin (LIPITOR) 40 MG tablet Take 1 tablet (40 mg total) by mouth daily at 6 PM. 06/18/19 09/16/19 Yes Dahal, Melina Schools, MD  Blood Glucose Monitoring Suppl (TRUE METRIX METER) DEVI 1 each by Does not apply  route 3 (three) times daily before meals. 10/07/18  Yes Hoy Register, MD  clopidogrel (PLAVIX) 75 MG tablet Take 1 tablet (75 mg total) by mouth daily. 06/19/19 09/17/19 Yes Dahal, Melina Schools, MD  glimepiride (AMARYL) 1 MG tablet Take 1 tablet (1 mg total) by mouth daily with breakfast. 06/18/19 09/16/19 Yes Dahal, Melina Schools, MD  glucose blood (TRUE METRIX BLOOD GLUCOSE TEST) test strip Use 3 times daily before meals Patient taking differently: 1 each by Other route 3 (three) times daily before meals.  10/07/18  Yes Newlin, Odette Horns, MD  insulin glargine (LANTUS) 100 UNIT/ML Solostar Pen Inject 10 Units into the skin daily. 06/18/19 09/16/19 Yes Dahal, Melina Schools, MD  lisinopril (ZESTRIL) 10 MG tablet Take 1 tablet (10 mg total) by mouth daily. 06/18/19 09/16/19 Yes Dahal, Melina Schools, MD  nicotine (NICODERM CQ - DOSED IN MG/24 HOURS) 21 mg/24hr patch Place 1 patch (21 mg total) onto the skin daily. 06/19/19  Yes Dahal, Melina Schools, MD  traMADol (ULTRAM) 50 MG tablet Take 1 tablet (50 mg total) by mouth 2 (two) times daily. 08/06/18  Yes Hoy Register, MD  TRUEplus Lancets 28G MISC 1 each by Does not apply route 3 (three) times daily before meals. 10/07/18  Yes Hoy Register, MD  venlafaxine XR (EFFEXOR-XR) 75 MG 24 hr capsule Take 1 capsule (75 mg total) by mouth daily. 10/07/18  Yes Hoy Register, MD    Allergies    Patient has no known allergies.  Review of Systems   Review of Systems  Constitutional: Positive for fatigue. Negative for appetite change.  HENT: Negative for congestion.   Respiratory: Negative for cough.   Cardiovascular: Negative for leg swelling.  Gastrointestinal: Negative for abdominal pain.  Genitourinary: Negative for flank pain.  Musculoskeletal: Negative for back pain.  Neurological: Positive for weakness.  Psychiatric/Behavioral: Positive for confusion.    Physical Exam Updated Vital Signs BP (!) 165/88   Pulse 62   Temp 98.4 F (36.9 C)   Resp 16   Ht 5\' 4"  (1.626 m)   Wt 75.4 kg    SpO2 97%   BMI 28.53 kg/m   Physical Exam Vitals and nursing note reviewed.  Constitutional:      Appearance: Normal appearance.  HENT:     Head: Normocephalic.     Mouth/Throat:     Mouth: Mucous membranes are moist.  Eyes:     Pupils: Pupils are equal, round, and reactive to light.  Cardiovascular:     Rate and Rhythm: Regular rhythm.  Pulmonary:     Breath sounds: No wheezing or rhonchi.  Abdominal:     Tenderness: There is no abdominal tenderness.  Musculoskeletal:        General: No tenderness.     Cervical back: Neck supple.  Skin:    General: Skin is warm.  Neurological:     Mental Status: She is alert and oriented to person, place, and time.  Comments: Awake and pleasant.  Answers questions.  Moves all extremities.  Near poststroke baseline per family member with patient.  Reportedly just mildly slowed answers     ED Results / Procedures / Treatments   Labs (all labs ordered are listed, but only abnormal results are displayed) Labs Reviewed  COMPREHENSIVE METABOLIC PANEL - Abnormal; Notable for the following components:      Result Value   Glucose, Bld 234 (*)    All other components within normal limits  PROTIME-INR  APTT  CBC  DIFFERENTIAL  URINALYSIS, ROUTINE W REFLEX MICROSCOPIC    EKG None  Radiology CT HEAD WO CONTRAST  Result Date: 06/22/2019 CLINICAL DATA:  Sleepy. EXAM: CT HEAD WITHOUT CONTRAST TECHNIQUE: Contiguous axial images were obtained from the base of the skull through the vertex without intravenous contrast. COMPARISON:  Jun 16, 2019 FINDINGS: Brain: There is mild cerebral atrophy with widening of the extra-axial spaces and ventricular dilatation. There are areas of decreased attenuation within the white matter tracts of the supratentorial brain, consistent with microvascular disease changes. A stable area of white matter low attenuation is seen within the posteromedial aspect of the right temporal lobe. A 1.4 cm x 0.9 cm area of white  matter low attenuation is seen within the basal ganglia on the left (axial CT image 17, CT series 3. This is increased in size when compared to the prior study. Small chronic bilateral basal ganglia lacunar infarcts are seen. Vascular: No hyperdense vessel or unexpected calcification. Skull: Normal. Negative for fracture or focal lesion. Sinuses/Orbits: No acute finding. Other: None. IMPRESSION: 1. Mild cerebral atrophy and microvascular disease changes of the supratentorial brain. 2. Stable area of white matter low attenuation within the posteromedial aspect of the right temporal lobe which is consistent with the patient's known acute infarct. 3. Additional findings consistent with an evolving acute/subacute left basal ganglia infarct. MRI correlation is recommended. Electronically Signed   By: Virgina Norfolk M.D.   On: 06/22/2019 15:40   MR BRAIN WO CONTRAST  Result Date: 06/22/2019 CLINICAL DATA:  Encephalopathy. Recent stroke. EXAM: MRI HEAD WITHOUT CONTRAST TECHNIQUE: Multiplanar, multiecho pulse sequences of the brain and surrounding structures were obtained without intravenous contrast. COMPARISON:  Brain MRI 06/16/2019 FINDINGS: Brain: There is a new acute infarct of the left basal ganglia. Other areas of subacute ischemia within both cerebral hemispheres and the brainstem show expected evolution of diffusion weighted changes. No acute hemorrhage. Multifocal white matter hyperintensity, most commonly due to chronic ischemic microangiopathy. Normal volume of CSF spaces. Unchanged appearance of blood products at the right medial temporal lobe. Normal midline structures. Vascular: Normal flow voids. Skull and upper cervical spine: Normal marrow signal. Sinuses/Orbits: Negative. Other: None. IMPRESSION: 1. New acute infarct of the left basal ganglia. No hemorrhage or mass effect. 2. Expected evolution of multiple subacute infarcts within both cerebral hemispheres and the brainstem. 3. Unchanged appearance  of blood products at the right medial temporal lobe. Electronically Signed   By: Ulyses Jarred M.D.   On: 06/22/2019 23:08    Procedures Procedures (including critical care time)  Medications Ordered in ED Medications  sodium chloride flush (NS) 0.9 % injection 3 mL (3 mLs Intravenous Not Given 06/22/19 2322)  LORazepam (ATIVAN) tablet 1 mg (1 mg Oral Given 06/22/19 2150)    ED Course  I have reviewed the triage vital signs and the nursing notes.  Pertinent labs & imaging results that were available during my care of the patient were reviewed by me and  considered in my medical decision making (see chart for details).    MDM Rules/Calculators/A&P                      Patient presents with increased sleepiness and some mild confusion. Recent stroke. CT scan showed some abnormalities and MRI done showed potential new stroke but reviewed with neurology and they think this worsening of stroke that was previously there. Unfortunately not much to do in terms of treatment at this point. Already on medication and already had a work-up. Discussed with patient and family member. Will discharge home. Follow-up with neurology. Does not appear to have need for an acute admission at this time. Final Clinical Impression(s) / ED Diagnoses Final diagnoses:  Cerebrovascular accident (CVA), unspecified mechanism [facility])    Rx / DC Orders ED Discharge Orders    None       Benjiman Core, MD 06/22/19 2336

## 2019-06-25 ENCOUNTER — Ambulatory Visit (INDEPENDENT_AMBULATORY_CARE_PROVIDER_SITE_OTHER): Payer: Medicaid Other | Admitting: *Deleted

## 2019-06-25 DIAGNOSIS — I6340 Cerebral infarction due to embolism of unspecified cerebral artery: Secondary | ICD-10-CM

## 2019-06-25 LAB — CUP PACEART REMOTE DEVICE CHECK
Date Time Interrogation Session: 20210527171502
Implantable Pulse Generator Implant Date: 20200504

## 2019-06-25 NOTE — Progress Notes (Signed)
Carelink Summary Report / Loop Recorder 

## 2019-07-01 ENCOUNTER — Ambulatory Visit: Payer: Medicaid Other | Admitting: Critical Care Medicine

## 2019-07-07 ENCOUNTER — Encounter: Payer: Self-pay | Admitting: Adult Health

## 2019-07-07 ENCOUNTER — Telehealth: Payer: Self-pay

## 2019-07-07 ENCOUNTER — Ambulatory Visit: Payer: Medicaid Other | Admitting: Adult Health

## 2019-07-07 VITALS — BP 157/93 | HR 85 | Ht 65.0 in | Wt 149.0 lb

## 2019-07-07 DIAGNOSIS — E785 Hyperlipidemia, unspecified: Secondary | ICD-10-CM | POA: Diagnosis not present

## 2019-07-07 DIAGNOSIS — I69319 Unspecified symptoms and signs involving cognitive functions following cerebral infarction: Secondary | ICD-10-CM

## 2019-07-07 DIAGNOSIS — I10 Essential (primary) hypertension: Secondary | ICD-10-CM | POA: Diagnosis not present

## 2019-07-07 DIAGNOSIS — E1169 Type 2 diabetes mellitus with other specified complication: Secondary | ICD-10-CM | POA: Diagnosis not present

## 2019-07-07 DIAGNOSIS — I639 Cerebral infarction, unspecified: Secondary | ICD-10-CM

## 2019-07-07 MED ORDER — ATORVASTATIN CALCIUM 80 MG PO TABS: 80.0000 mg | ORAL_TABLET | Freq: Every day | ORAL | 3 refills | Status: AC

## 2019-07-07 NOTE — Patient Instructions (Addendum)
Continue physical therapy at home and start speech therapy for cognitive concerns  Excess fatigue could be after your stroke but also may be due to some depression - please ensure you follow up with your primary doctor for further evaluation  Continue clopidogrel 75 mg daily  and increased atorvastatin 80mg  daily  for secondary stroke prevention  Discontinue aspirin 81mg  daily as no longer indicated   Continue to follow up with PCP regarding cholesterol, blood pressure and diabetes management   Continue to monitor blood pressure at home  Maintain strict control of hypertension with blood pressure goal below 130/90, diabetes with hemoglobin A1c goal below 6.5% and cholesterol with LDL cholesterol (bad cholesterol) goal below 70 mg/dL. I also advised the patient to eat a healthy diet with plenty of whole grains, cereals, fruits and vegetables, exercise regularly and maintain ideal body weight.  Followup in the future with me in 3 months or call earlier if needed       Thank you for coming to see at [facility]. I hope we have been able to provide you high quality care today.  You may receive a patient satisfaction survey over the next few weeks. We would appreciate your feedback and comments so that we may continue to improve ourselves and the health of our patients.

## 2019-07-07 NOTE — Telephone Encounter (Signed)
Unable to speak with patient to inform of disconnected monitor. °

## 2019-07-07 NOTE — Progress Notes (Signed)
[facility] [address] [place]. [place] [address] [phone]       STROKE FOLLOW UP NOTE  Carrie King Date of Birth:  February 11, 1955 Medical Record Number:  [id number]   Reason for Referral:  hospital stroke follow up    CHIEF COMPLAINT:  Chief Complaint  Patient presents with  . Follow-up    Rm 9 here for a stroke f/u.  Pt says she can not think clear.    HPI:  Carrie King is a 64 y.o. female with history of HTN, DB, and stroke who presented on 06/15/2019 with 1 week AMS.  Stroke work-up showed multiple acute and subacute bilateral infra- and supratentorial infarcts embolic pattern secondary to uncontrolled risk factors.  Loop recorder previously placed which was interrogated and negative for atrial fibrillation.  CTA head/neck intracranial stenosis unchanged from prior imaging 1 year ago.  Recommended DAPT for 3 weeks then Plavix alone.  Hypertensive urgency with BP 197/112 on arrival stabilized during admission with long-term BP goal normotensive range.  LDL 168 and increase atorvastatin from 40 mg to 80 mg daily.  Uncontrolled DM with A1c 12.5 and recommended close PCP follow-up.  Current tobacco use with smoking cessation counseling provided.  Other stroke risk factors include UDS THC positive and prior history of stroke 04/2018.  Stroke:  Multiple acute and subacute B infra- and supratentorial infarcts embolic pattern secondary to uncontrolled stroke risk factors  CT head R temporal lobe hypodensity. Chronic basal ganglia lacunes.   MRI  Medial R temporal lobe infarct w/ petechial hemorrhage. L pons, R thalamic, L caudate body, L periatrial white matter acute and subacute infarcts.    CTA head & neck no LVO. Aortic atherosclerosis. B ICA bifurcation atherosclerosis, B siphons 50%. Distal MCA atherosclerosis. R VA 30% or origin and cervical. 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 BA mild atherosclerosis. B PCA atherosclerosis. - overall unchanged since  05/2018  Loop interrogation no afib   EEG intermittent rhythmic slowing, no sz  2D Echo EF 70 to 75%  LDL 168  HgbA1c 12.5  Lovenox 40 mg sq daily for VTE prophylaxis  aspirin 81 mg daily prior to admission, now on aspirin 81 mg daily and plavix 75mg  daily DAPT for 3 weeks and the plavix alone.   Therapy recommendations:  HH PT  Disposition:   Home  Today, 07/07/2019, Carrie King is being seen for hospital follow-up accompanied by her daughter.  She was previously seen 1 year ago for hospital follow-up after cryptogenic stroke with placement of loop recorder.  She had complaints of cognitive impairment at that time.  Continues to have residual cognitive impairment worse from baseline and imbalance.  Currently working with home health physical therapy and plans on initiating speech therapy for cognition in the near future.  Cognitive deficit includes impairment of short-term memory with delayed thought process and difficulty processing information.  She also reports excessive daytime fatigue which she does not do much during the day as she has no energy and "just does not feel like it".  She is currently treated for depression with Effexor and plans on following PCP for further discussion and depression management.  She has continued on aspirin 81 mg daily and clopidogrel 75 mg daily despite 3-week recommendation but denies bleeding or bruising.  Continues on atorvastatin 40 mg daily as increased dosage was not provided at discharge.  Blood pressure today 157/93 with PCP recently increasing lisinopril due to continued elevated pressures.  Glucose levels continue to fluctuate.  Closely monitored by PCP for HTN, HLD and DM management.  Reports complete tobacco cessation since hospital discharge.  Loop recorder has not shown atrial fibrillation thus far.  No further concerns at this time.    ROS:   14 system review of systems performed and negative with exception of fatigue, memory loss, gait  difficulty  PMH:  Past Medical History:  Diagnosis Date  . Diabetes mellitus without complication ([place])   . Hypertension   . Stroke ([place])   . Tobacco abuse   . Uncontrolled diabetes mellitus ([place]) 05/16/2018    PSH:  Past Surgical History:  Procedure Laterality Date  . EXTERNAL FIXATION ARM Left 05/14/2018   Procedure: IRRIGATION AND DEBRIDEMENT, EXTERNAL FIXATION LEFT ELBOW FRACTURE;  Surgeon: Shona Needles, MD;  Location: [place];  Service: Orthopedics;  Laterality: Left;  . I & D EXTREMITY Left 05/18/2018   Procedure: IRRIGATION AND DEBRIDEMENT EXTREMITY;  Surgeon: Shona Needles, MD;  Location: [place];  Service: Orthopedics;  Laterality: Left;  . LOOP RECORDER INSERTION N/A 06/01/2018   Procedure: LOOP RECORDER INSERTION;  Surgeon: Evans Lance, MD;  Location: [place];  Service: Cardiovascular;  Laterality: N/A;  . ORIF HUMERUS FRACTURE Left 05/18/2018   Procedure: OPEN REDUCTION INTERNAL FIXATION (ORIF) DISTAL HUMERUS FRACTURE;  Surgeon: Shona Needles, MD;  Location: [place];  Service: Orthopedics;  Laterality: Left;  . ORIF ULNAR FRACTURE Left 05/18/2018   Procedure: OPEN REDUCTION INTERNAL FIXATION (ORIF) ULNAR FRACTURE;  Surgeon: Shona Needles, MD;  Location: [place];  Service: Orthopedics;  Laterality: Left;    Social History:  Social History   Socioeconomic History  . Marital status: Married    Spouse name: Not on file  . Number of children: Not on file  . Years of education: Not on file  . Highest education level: Not on file  Occupational History  . Not on file  Tobacco Use  . Smoking status: Current Every Day Smoker    Packs/day: 1.00    Types: Cigarettes  . Smokeless tobacco: Former Systems developer    Quit date: 05/14/2018  Substance and Sexual Activity  . Alcohol use: Never  . Drug use: Yes    Types: Marijuana  . Sexual activity: Not on file  Other Topics Concern  . Not on file  Social History Narrative  . Not on file   Social Determinants of Health    Financial Resource Strain:   . Difficulty of Paying Living Expenses:   Food Insecurity:   . Worried About Charity fundraiser in the Last Year:   . Arboriculturist in the Last Year:   Transportation Needs:   . Film/video editor (Medical):   Marland Kitchen Lack of Transportation (Non-Medical):   Physical Activity:   . Days of Exercise per Week:   . Minutes of Exercise per Session:   Stress:   . Feeling of Stress :   Social Connections:   . Frequency of Communication with Friends and Family:   . Frequency of Social Gatherings with Friends and Family:   . Attends Religious Services:   . Active Member of Clubs or Organizations:   . Attends Archivist Meetings:   Marland Kitchen Marital Status:   Intimate Partner Violence:   . Fear of Current or Ex-Partner:   . Emotionally Abused:   Marland Kitchen Physically Abused:   . Sexually Abused:     Family History:  Family History  Problem Relation Age of Onset  .  Hypertension Mother   . Diabetes Mother   . Hypertension Father   . Heart attack Father     Medications:   Current Outpatient Medications on File Prior to Visit  Medication Sig Dispense Refill  . acetaminophen (TYLENOL) 325 MG tablet Take 2 tablets (650 mg total) by mouth every 6 (six) hours as needed for mild pain (or Fever >/= 101).    Marland Kitchen aspirin 81 MG EC tablet Take 1 tablet (81 mg total) by mouth daily. 90 tablet 0  . atorvastatin (LIPITOR) 40 MG tablet Take 1 tablet (40 mg total) by mouth daily at 6 PM. 90 tablet 0  . Blood Glucose Monitoring Suppl (TRUE METRIX METER) DEVI 1 each by Does not apply route 3 (three) times daily before meals. 1 Device 0  . clopidogrel (PLAVIX) 75 MG tablet Take 1 tablet (75 mg total) by mouth daily. 90 tablet 0  . glimepiride (AMARYL) 1 MG tablet Take 1 tablet (1 mg total) by mouth daily with breakfast. 90 tablet 0  . glucose blood (TRUE METRIX BLOOD GLUCOSE TEST) test strip Use 3 times daily before meals (Patient taking differently: 1 each by Other route 3  (three) times daily before meals. ) 100 each 12  . insulin glargine (LANTUS) 100 UNIT/ML Solostar Pen Inject 10 Units into the skin daily. 9 mL 0  . lisinopril (ZESTRIL) 10 MG tablet Take 1 tablet (10 mg total) by mouth daily. 90 tablet 0  . nicotine (NICODERM CQ - DOSED IN MG/24 HOURS) 21 mg/24hr patch Place 1 patch (21 mg total) onto the skin daily. 28 patch 0  . traMADol (ULTRAM) 50 MG tablet Take 1 tablet (50 mg total) by mouth 2 (two) times daily. 60 tablet 1  . TRUEplus Lancets 28G MISC 1 each by Does not apply route 3 (three) times daily before meals. 100 each 12  . venlafaxine XR (EFFEXOR-XR) 75 MG 24 hr capsule Take 1 capsule (75 mg total) by mouth daily. 30 capsule 6   No current facility-administered medications on file prior to visit.    Allergies:  No Known Allergies   Physical Exam Today's Vitals   07/07/19 1452  BP: (!) 157/93  Pulse: 85  Weight: 149 lb (67.6 kg)  Height: 5\' 5"  (1.651 m)   Body mass index is 24.79 kg/m.  General: well developed, well nourished,  pleasant middle-age Caucasian female, seated, in no evident distress Head: head normocephalic and atraumatic.   Neck: supple with no carotid or supraclavicular bruits Cardiovascular: regular rate and rhythm, no murmurs Musculoskeletal: no deformity Skin:  no rash/petichiae Vascular:  Normal pulses all extremities   Neurologic Exam Mental Status: Awake and fully alert.   Fluent speech and language.  Recent memory delayed and remote memory intact. Attention span, concentration and fund of knowledge diminished with daughter providing majority of information. Mood and affect flat. Cranial Nerves: Fundoscopic exam reveals sharp disc margins. Pupils equal, briskly reactive to light. Extraocular movements full without nystagmus. Visual fields full to confrontation. Hearing intact. Facial sensation intact.  Mild right lower facial weakness. Motor: Normal bulk and tone. Normal strength in all tested extremity  muscles. Sensory.: intact to touch , pinprick , position and vibratory sensation.  Coordination: Rapid alternating movements normal in all extremities. Finger-to-nose and heel-to-shin performed accurately bilaterally. Gait and Station: Arises from chair without difficulty. Stance is normal. Gait demonstrates  slow cautious steps with mild imbalance.  Romberg positive.  Unable to perform tandem walk. Reflexes: 1+ and symmetric. Toes downgoing.  NIHSS  1 Modified Rankin  3     ASSESSMENT: RICHANDA DARIN is a 64 y.o. year old female here with recent multiple acute and subacute B infra- and supratentorial infarcts embolic pattern secondary to uncontrolled risk factors with residual worsening cognitive impairment and imbalance.  Previously seen for left lateral medullary infarct and right ACA punctate infarct embolic pattern on 05/30/2018 secondary to undetermined source therefore loop recorder placed to rule out atrial fibrillation. Vascular risk factors include DM, HTN, HLD, tobacco use, intracranial stenosis and prior stroke on imaging.  Residual deficits of cognitive impairment, imbalance and fatigue    PLAN:  1. Recurrent strokes:  -Cognitive impairment and imbalance, poststroke: Continue home health PT and initiate speech therapy for cognitive impairment.  Will obtain MMSE at follow-up visit for hopeful improvement with speech therapy -Continue clopidogrel 75 mg daily  and increase atorvastatin dosage 40 mg to 80 mg daily for secondary stroke prevention.  Advised to discontinue aspirin at this time as 3 weeks DAPT completed.   -Continue to monitor loop recorder for atrial fibrillation.   -Maintain strict control of hypertension with blood pressure goal below 130/90, diabetes with hemoglobin A1c goal below 6.5% and cholesterol with LDL cholesterol (bad cholesterol) goal below 70 mg/dL.  I also advised the patient to eat a healthy diet with plenty of whole grains, cereals, fruits and  vegetables, exercise regularly with at least 30 minutes of continuous activity daily and maintain ideal body weight.  2. HTN: Continue to follow PCP for monitoring and management 3. HLD: Increase atorvastatin dosage for uncontrolled HLD with refills provided.  Request repeat lipid panel in 4 to 6 weeks with PCP otherwise will obtain at follow-up visit 4. DMII: Uncontrolled.  Continue to follow with PCP for monitoring and management 5. Fatigue: Likely multifactorial with poststroke fatigue, sedentary lifestyle and likely underlying depression.  Daughter reports follow-up with PCP in the near future for further depression management   Follow up in 3 month or call earlier if needed   Greater than 50% of time during this 30 minute non-face-to-face visit was spent on counseling, explanation of diagnosis of embolic stroke, reviewing risk factor management of HTN, HLD, DM, planning of further management along with potential future management, and discussion with patient and family answering all questions.    George Hugh, AGNP-BC  [facility] [address] [place], [place] [address]  Phone [phone] Fax [phone] Note: This document was prepared with digital dictation and possible smart phrase technology. Any transcriptional errors that result from this process are unintentional.

## 2019-07-12 NOTE — Progress Notes (Signed)
I agree with the above plan 

## 2019-09-21 ENCOUNTER — Telehealth: Payer: Self-pay | Admitting: Adult Health

## 2019-09-21 NOTE — Telephone Encounter (Signed)
Sandy from [facility] called wanting to know if provider can fill pt's traMADol (ULTRAM) 50 MG tablet again due to it being a year. Please advise.

## 2019-09-21 NOTE — Telephone Encounter (Signed)
Tramadol was prescribed y pts pcp, not [place].

## 2019-09-21 NOTE — Telephone Encounter (Signed)
I spoke to Electronics engineer at [facility].  Pt has been getting from [facility] NP in [place] [phone] (neurology).  Every month from 10-01-2018, Dr. Alvis Lemmings prior to that. She verbalized understanding.

## 2019-09-25 ENCOUNTER — Other Ambulatory Visit: Payer: Self-pay | Admitting: Family Medicine

## 2019-09-26 NOTE — Telephone Encounter (Signed)
Requested  medications are  due for refill today yes  Requested medications are on the active medication list yes  Last refill 08/24/19  Last visit 11 months ago  Future visit scheduled no  Notes to clinic Failed protocol of valid visit within 6 months and no visit scheduled.

## 2019-10-26 ENCOUNTER — Ambulatory Visit: Payer: Medicaid Other | Admitting: Adult Health

## 2019-10-26 ENCOUNTER — Other Ambulatory Visit: Payer: Self-pay

## 2019-10-26 ENCOUNTER — Encounter: Payer: Self-pay | Admitting: Adult Health

## 2019-10-26 VITALS — BP 179/89 | HR 81 | Ht 65.0 in | Wt 148.0 lb

## 2019-10-26 DIAGNOSIS — I639 Cerebral infarction, unspecified: Secondary | ICD-10-CM

## 2019-10-26 DIAGNOSIS — I10 Essential (primary) hypertension: Secondary | ICD-10-CM

## 2019-10-26 DIAGNOSIS — I69319 Unspecified symptoms and signs involving cognitive functions following cerebral infarction: Secondary | ICD-10-CM

## 2019-10-26 DIAGNOSIS — E785 Hyperlipidemia, unspecified: Secondary | ICD-10-CM

## 2019-10-26 DIAGNOSIS — E1169 Type 2 diabetes mellitus with other specified complication: Secondary | ICD-10-CM

## 2019-10-26 NOTE — Patient Instructions (Addendum)
Continue clopidogrel 75 mg daily  and atorvastatin 80 mg daily for secondary stroke prevention  Will attempt to reach out to cardiology in regards to loop recorder monitoring and continued difficulties  Continue to follow up with PCP regarding cholesterol, blood pressure and diabetes management  Maintain strict control of hypertension with blood pressure goal below 130/90, diabetes with hemoglobin A1c goal below 7.0% and cholesterol with LDL cholesterol (bad cholesterol) goal below 70 mg/dL.       Followup in the future with me in 6 months or call earlier if needed       Thank you for coming to see [place] at [facility]. I hope we have been able to provide you high quality care today.  You may receive a patient satisfaction survey over the next few weeks. We would appreciate your feedback and comments so that we may continue to improve ourselves and the health of our patients.   Cognitive Rehabilitation After a Stroke After a stroke, you may have various problems with thinking (cognitive disability). The types of problems you have will depend on how severe the stroke was and where it was located in the brain. Problems may include:  Problems with short-term memory.  Trouble paying attention.  Trouble communicating or understanding language (aphasia).  A drop in mental ability that may interfere with daily life (dementia).  Trouble with problem-solving and information processing.  Problems with reading, writing, or math.  Problems with your ability to plan and to perform activities in sequence (executive function). These problems can feel overwhelming. However, with rehabilitation and time to heal, many people have improvement in their symptoms. What causes cognitive disability? A stroke happens when blood cannot flow to certain areas of the brain. When this happens, brain cells die in the affected areas because they cannot get oxygen and nutrients from the  blood. Cognitive disability is caused by the death of cells in the areas of the brain that control thinking. What is cognitive rehabilitation? Cognitive rehabilitation is a program to help you improve your thinking skills after a stroke. Rehabilitation cannot completely reverse the effects of a stroke, but it can help you with memory, problem-solving, and communication skills. Therapy focuses on:  Improving brain function. This may involve activities such as learning to break down tasks into simple steps.  Helping you learn ways to cope with thinking problems. For example, you might learn memory tricks or do activities that stimulate memory, such as naming objects or describing pictures. Cognitive rehabilitation may include:  Speech-language therapy to help you understand and use language to communicate.  Occupational therapy to help you perform daily activities.  Music therapy to help relieve stress, anxiety, and depression. This may involve listening to music, singing, or playing instruments.  Physical therapy to help improve your ability to move and perform actions that involve the muscles (motor functions). When will therapy start and where will I have therapy? Your health care provider will decide when it is best for you to start therapy. In some cases, people start rehabilitation as soon as their health is stable, which may be 24-48 hours after the stroke. Rehabilitation can take place in a few different places, based on your needs. It may take place in:  The hospital or an in-patient rehabilitation hospital.  An outpatient rehabilitation facility.  A long-term care facility.  A community rehabilitation clinic.  Your home. What are some tools to help after a stroke? There are a number of tools and apps that you  can use on your smartphone, personal computer, or tablet to help improve brain function. Some of these apps include:  Calendar reminders or alarm apps to help with  memory.  Note-taking or sketch pad apps to help with memory or communication.  Text-to-speech apps that allow you to listen to what you are reading, which helps your ability to understanding text.  Picture dictionary or picture message apps to help with communication.  E-readers. These can highlight text as it is read aloud, which helps with listening and reading skills. How can my friends or family help during my rehabilitation? During your recovery, it is important that your friends and family members help you work toward more independence. Your caregivers should speak with your health care providers to learn how they can best help you during recovery. This may include working on speech-language or memory exercises at home, or helping with daily tasks and errands. If you have cognitive disability, you may be at risk for injury or accidents at home, such as forgetting to turn off the stove. Friends and family members can help ensure home safety by taking steps such as getting appliances with automatic shut-off features or storing dangerous objects in a secure place. What else should I know about cognitive rehabilitation after a stroke? Having trouble with memory and problem-solving can make you feel alone. You may also have mood changes, anxiety, or depression after a stroke. It is important to:  Stay connected with others through social groups, online support groups, or your community.  Talk to your friends, family, and caregivers about any emotional problems you are having.  Go to one-on-one or group therapy as suggested by your health care provider.  Stay physically active and exercise as often as suggested by your health care provider. Summary  After a stroke, some people have problems with thinking that affect attention, memory, language, communication, and problem-solving.  Cognitive rehabilitation is a program to help you regain brain function and learn skills to cope with thinking  problems.  Rehabilitation cannot completely reverse the effects of a stroke, but it can help to improve quality of life.  Cognitive rehabilitation may include speech-language therapy, occupational therapy, music therapy, and physical therapy. This information is not intended to replace advice given to you by your health care provider. Make sure you discuss any questions you have with your health care provider. Document Revised: 05/06/2018 Document Reviewed: 04/19/2016 Elsevier Patient Education  2020 [employer].

## 2019-10-26 NOTE — Progress Notes (Signed)
[facility] [address] [place] [address] [phone]       STROKE FOLLOW UP NOTE  Ms. Carrie King Date of Birth:  09-15-1955 Medical Record Number:  [id number]   Reason for Referral: stroke follow up    CHIEF COMPLAINT:  Chief Complaint  Patient presents with  . Follow-up    rm 9  . Cerebrovascular Accident    Pt is having no new sx.    HPI:   Today,  10/26/2019, Carrie King returns for stroke follow-up accompanied by her daughter.  Reports improvement of cognition and memory since prior visit but does have occasional "memory questions" per daughter and slower processing speeds.  Regular difficulty with short-term memory and timeframes.  Denies new or worsening stroke/TIA symptoms  Continues on plavix 75 mg daily with mild bruising but no bleeding Continues on atorvastatin 80mg  daily without myalgias  Glucose levels stable per patient - typically 120s-140s; daughter reports improvement of A1c with last check but unsure exact level (unable to view via epic)  Blood pressure today elevated but monitors at home which has been stable   Loop recorder has not downloaded report since hospitalization in May with daughter reporting difficulty with monitoring and has had difficulty getting machine to work since implanting.   No further concerns at this time     History provided for reference purposes only Update 07/07/2019 JM: Carrie King is being seen for hospital follow-up accompanied by her daughter.  She was previously seen 1 year ago for hospital follow-up after cryptogenic stroke with placement of loop recorder.  She had complaints of cognitive impairment at that time.  Continues to have residual cognitive impairment worse from baseline and imbalance.  Currently working with home health physical therapy and plans on initiating speech therapy for cognition in the near future.  Cognitive deficit includes impairment of short-term memory with  delayed thought process and difficulty processing information.  She also reports excessive daytime fatigue which she does not do much during the day as she has no energy and "just does not feel like it".  She is currently treated for depression with Effexor and plans on following PCP for further discussion and depression management.  She has continued on aspirin 81 mg daily and clopidogrel 75 mg daily despite 3-week recommendation but denies bleeding or bruising.  Continues on atorvastatin 40 mg daily as increased dosage was not provided at discharge.  Blood pressure today 157/93 with PCP recently increasing lisinopril due to continued elevated pressures.  Glucose levels continue to fluctuate.  Closely monitored by PCP for HTN, HLD and DM management.  Reports complete tobacco cessation since hospital discharge.  Loop recorder has not shown atrial fibrillation thus far.  No further concerns at this time.  Carrie King is a 64 y.o. female with history of HTN, DB, and stroke who presented on 06/15/2019 with 1 week AMS.  Stroke work-up showed multiple acute and subacute bilateral infra- and supratentorial infarcts embolic pattern secondary to uncontrolled risk factors.  Loop recorder previously placed which was interrogated and negative for atrial fibrillation.  CTA head/neck intracranial stenosis unchanged from prior imaging 1 year ago.  Recommended DAPT for 3 weeks then Plavix alone.  Hypertensive urgency with BP 197/112 on arrival stabilized during admission with long-term BP goal normotensive range.  LDL 168 and increase atorvastatin from 40 mg to 80 mg daily.  Uncontrolled DM with A1c 12.5 and recommended close PCP follow-up.  Current tobacco use with smoking cessation  counseling provided.  Other stroke risk factors include UDS THC positive and prior history of stroke 04/2018.  Stroke:  Multiple acute and subacute B infra- and supratentorial infarcts embolic pattern secondary to uncontrolled stroke risk  factors  CT head R temporal lobe hypodensity. Chronic basal ganglia lacunes.   MRI  Medial R temporal lobe infarct w/ petechial hemorrhage. L pons, R thalamic, L caudate body, L periatrial white matter acute and subacute infarcts.    CTA head & neck no LVO. Aortic atherosclerosis. B ICA bifurcation atherosclerosis, B siphons 50%. Distal MCA atherosclerosis. R VA 30% or origin and cervical. 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 BA mild atherosclerosis. B PCA atherosclerosis. - overall unchanged since 05/2018  Loop interrogation no afib   EEG intermittent rhythmic slowing, no sz  2D Echo EF 70 to 75%  LDL 168  HgbA1c 12.5  Lovenox 40 mg sq daily for VTE prophylaxis  aspirin 81 mg daily prior to admission, now on aspirin 81 mg daily and plavix 75mg  daily DAPT for 3 weeks and the plavix alone.   Therapy recommendations:  HH PT  Disposition:   Home     ROS:   14 system review of systems performed and negative with exception of memory loss  PMH:  Past Medical History:  Diagnosis Date  . Diabetes mellitus without complication (HCC)   . Hypertension   . Stroke (HCC)   . Tobacco abuse   . Uncontrolled diabetes mellitus (HCC) 05/16/2018    PSH:  Past Surgical History:  Procedure Laterality Date  . EXTERNAL FIXATION ARM Left 05/14/2018   Procedure: IRRIGATION AND DEBRIDEMENT, EXTERNAL FIXATION LEFT ELBOW FRACTURE;  Surgeon: 05/16/2018, MD;  Location: [facility] OR;  Service: Orthopedics;  Laterality: Left;  . I & D EXTREMITY Left 05/18/2018   Procedure: IRRIGATION AND DEBRIDEMENT EXTREMITY;  Surgeon: 05/20/2018, MD;  Location: [facility] OR;  Service: Orthopedics;  Laterality: Left;  . LOOP RECORDER INSERTION N/A 06/01/2018   Procedure: LOOP RECORDER INSERTION;  Surgeon: 08/01/2018, MD;  Location: [facility] INVASIVE CV LAB;  Service: Cardiovascular;  Laterality: N/A;  . ORIF HUMERUS FRACTURE Left 05/18/2018   Procedure: OPEN REDUCTION INTERNAL FIXATION (ORIF) DISTAL HUMERUS FRACTURE;  Surgeon: 05/20/2018, MD;  Location: [facility] OR;  Service: Orthopedics;  Laterality: Left;  . ORIF ULNAR FRACTURE Left 05/18/2018   Procedure: OPEN REDUCTION INTERNAL FIXATION (ORIF) ULNAR FRACTURE;  Surgeon: 05/20/2018, MD;  Location: [facility] OR;  Service: Orthopedics;  Laterality: Left;    Social History:  Social History   Socioeconomic History  . Marital status: Married    Spouse name: Not on file  . Number of children: Not on file  . Years of education: Not on file  . Highest education level: Not on file  Occupational History  . Not on file  Tobacco Use  . Smoking status: Current Every Day Smoker    Packs/day: 1.00    Types: Cigarettes  . Smokeless tobacco: Former [employer]    Quit date: 05/14/2018  Substance and Sexual Activity  . Alcohol use: Never  . Drug use: Yes    Types: Marijuana  . Sexual activity: Not on file  Other Topics Concern  . Not on file  Social History Narrative  . Not on file   Social Determinants of Health   Financial Resource Strain:   . Difficulty of Paying Living Expenses: Not on file  Food Insecurity:   . Worried About 05/16/2018 in the Last Year: Not  on file  . Ran Out of Food in the Last Year: Not on file  Transportation Needs:   . Lack of Transportation (Medical): Not on file  . Lack of Transportation (Non-Medical): Not on file  Physical Activity:   . Days of Exercise per Week: Not on file  . Minutes of Exercise per Session: Not on file  Stress:   . Feeling of Stress : Not on file  Social Connections:   . Frequency of Communication with Friends and Family: Not on file  . Frequency of Social Gatherings with Friends and Family: Not on file  . Attends Religious Services: Not on file  . Active Member of Clubs or Organizations: Not on file  . Attends Banker Meetings: Not on file  . Marital Status: Not on file  Intimate Partner Violence:   . Fear of Current or Ex-Partner: Not on file  . Emotionally Abused: Not on file  . Physically Abused: Not  on file  . Sexually Abused: Not on file    Family History:  Family History  Problem Relation Age of Onset  . Hypertension Mother   . Diabetes Mother   . Hypertension Father   . Heart attack Father     Medications:   Current Outpatient Medications on File Prior to Visit  Medication Sig Dispense Refill  . acetaminophen (TYLENOL) 325 MG tablet Take 2 tablets (650 mg total) by mouth every 6 (six) hours as needed for mild pain (or Fever >/= 101).    Marland Kitchen atorvastatin (LIPITOR) 80 MG tablet Take 1 tablet (80 mg total) by mouth daily at 6 PM. 90 tablet 3  . Blood Glucose Monitoring Suppl (TRUE METRIX METER) DEVI 1 each by Does not apply route 3 (three) times daily before meals. 1 Device 0  . clopidogrel (PLAVIX) 75 MG tablet Take 75 mg by mouth daily.    Marland Kitchen glucose blood (TRUE METRIX BLOOD GLUCOSE TEST) test strip Use 3 times daily before meals (Patient taking differently: 1 each by Other route 3 (three) times daily before meals. ) 100 each 12  . TRUEplus Lancets 28G MISC 1 each by Does not apply route 3 (three) times daily before meals. 100 each 12  . venlafaxine XR (EFFEXOR-XR) 75 MG 24 hr capsule Take 75 mg by mouth daily with breakfast.    . glimepiride (AMARYL) 1 MG tablet Take 1 tablet (1 mg total) by mouth daily with breakfast. 90 tablet 0  . insulin glargine (LANTUS) 100 UNIT/ML Solostar Pen Inject 10 Units into the skin daily. 9 mL 0  . lisinopril (ZESTRIL) 10 MG tablet Take 1 tablet (10 mg total) by mouth daily. (Patient taking differently: Take 20 mg by mouth daily. ) 90 tablet 0   No current facility-administered medications on file prior to visit.    Allergies:  No Known Allergies   Physical Exam Today's Vitals   10/26/19 1521  BP: (!) 179/89  Pulse: 81  Weight: 148 lb (67.1 kg)  Height: 5\' 5"  (1.651 m)   Body mass index is 24.63 kg/m.  General: well developed, well nourished,  pleasant middle-age Caucasian female, seated, in no evident distress Head: head normocephalic  and atraumatic.   Neck: supple with no carotid or supraclavicular bruits Cardiovascular: regular rate and rhythm, no murmurs Musculoskeletal: no deformity Skin:  no rash/petichiae Vascular:  Normal pulses all extremities   Neurologic Exam Mental Status: Awake and fully alert. Fluent speech and language.  Recent memory delayed and remote memory intact. Attention span,  concentration and fund of knowledge diminished with daughter providing majority of information. Mood and affect flat. Cranial Nerves: Pupils equal, briskly reactive to light. Extraocular movements full without nystagmus. Visual fields full to confrontation. Hearing intact. Facial sensation intact.  Mild right lower facial weakness. Motor: Normal bulk and tone. Normal strength in all tested extremity muscles. Sensory.: intact to touch , pinprick , position and vibratory sensation.  Coordination: Rapid alternating movements normal in all extremities. Finger-to-nose and heel-to-shin performed accurately bilaterally. Gait and Station: Arises from chair without difficulty. Stance is normal. Gait demonstrates  normal stride length with slightly stiffened left leg and mild unsteadiness.  Able to heel toe walk and tandem walk without difficulty.  Romberg positive. Reflexes: 1+ and symmetric. Toes downgoing.   MMSE - Mini Mental State Exam 10/26/2019  Orientation to time 3  Orientation to Place 5  Registration 3  Attention/ Calculation 5  Recall 3  Language- name 2 objects 2  Language- repeat 1  Language- follow 3 step command 3  Language- read & follow direction 1  Write a sentence 1  Copy design 1  Total score 28       ASSESSMENT/PLAN: Carrie King is a 64 y.o. year old female here with recent multiple acute and subacute B infra- and supratentorial infarcts embolic pattern secondary to uncontrolled risk factors with residual worsening cognitive impairment and imbalance.  Previously seen for left lateral medullary infarct  and right ACA punctate infarct embolic pattern on 05/30/2018 secondary to undetermined source therefore loop recorder placed to rule out atrial fibrillation. Vascular risk factors include DM, HTN, HLD, tobacco use, intracranial stenosis and prior stroke on imaging.      1. Recurrent strokes:  a. Residual gait unsteadiness/imbalance: Stable  b. Residual cognitive impairment: MMSE today 28/30.  Does report improvement since prior visit.  Discussed memory exercises, importance of managing stroke risk factors, healthy diet, adequate exercise and healthy sleep hygiene c. Continue clopidogrel 75 mg daily  and atorvastatin 80 mg daily for secondary stroke prevention.   d. Loop recorder: Last report 05/2019 - per daughter, difficulty with monitor since implant in 05/2018. Will reach out to cardiology for further assistance.  No evidence of atrial fibrillation during that time e. Discussed importance of close PCP follow-up for aggressive stroke risk factor management 2. HTN: BP goal<130/90.  Elevated today but stable at home per patient.  Managed and monitored by PCP. 3. HLD: LDL goal <70.  Continue atorvastatin 80 mg daily.  Managed and monitored by PCP. 4. DMII: A1c goal <7.0.  Recent A1c improved per daughter. Continue to follow with PCP for monitoring and management   Follow up in 6 month or call earlier if needed   I spent 30 minutes of face-to-face and non-face-to-face time with patient and daughter.  This included previsit chart review, lab review, study review, order entry, electronic health record documentation, patient education and discussion regarding prior strokes, residual deficits, importance of managing stroke risk factors, ongoing use of medications, monitoring of loop recorder, completion and review of MMSE and answered all questions to patient and daughter satisfaction   George HughJessica Jakhai Fant, AGNP-BC  [facility] [address] [place], [place]  [id number]  Phone [phone] Fax [phone] Note: This document was prepared with digital dictation and possible smart phrase technology. Any transcriptional errors that result from this process are unintentional.

## 2019-10-27 ENCOUNTER — Encounter: Payer: Self-pay | Admitting: Adult Health

## 2019-10-27 NOTE — Progress Notes (Signed)
I agree with the above plan 

## 2020-04-24 ENCOUNTER — Ambulatory Visit: Payer: Medicaid Other | Admitting: Adult Health

## 2020-04-24 ENCOUNTER — Encounter: Payer: Self-pay | Admitting: Adult Health

## 2020-04-24 VITALS — BP 122/86 | HR 78 | Ht 65.0 in | Wt 158.0 lb

## 2020-04-24 DIAGNOSIS — I639 Cerebral infarction, unspecified: Secondary | ICD-10-CM | POA: Diagnosis not present

## 2020-04-24 NOTE — Progress Notes (Signed)
I agree with the above plan 

## 2020-04-24 NOTE — Progress Notes (Signed)
[facility] [address] [place]. [place] [address] [phone]       STROKE FOLLOW UP NOTE  Ms. Carrie King Date of Birth:  [birth year] Medical Record Number:  [id number]   Reason for Referral: stroke follow up    CHIEF COMPLAINT:  Chief Complaint  Patient presents with  . Follow-up    RM 14 with daughter  Joice Lofts) Pt is well, no symptoms, no complaints     HPI:  Today, 04/24/2020, Ms. Carrie King returns for 69-month stroke follow-up accompanied by her daughter  Doing well since prior visit without new stroke/TIA symptoms Reports mild cognitive impairment but has been stable since prior visit  Reports compliance on Plavix and atorvastatin -denies associated side effects Blood pressure today initially elevated and on recheck 122/86 Glucose levels stable with recent A1c 5 (per daughter, approx 2 weeks ago - unable to personally view via epic). Since discontinued glimepiride and remains on Lantus  Continues to have difficulty with ILR and monitor as pt [place] in [place] area that does not have cell phone service  No new concerns at this time     History provided for reference purposes only Update 10/26/2019 JM: Carrie King returns for stroke follow-up accompanied by her daughter.  Reports improvement of cognition and memory since prior visit but does have occasional "memory questions" per daughter and slower processing speeds.  Regular difficulty with short-term memory and timeframes.  Denies new or worsening stroke/TIA symptoms  Continues on plavix 75 mg daily with mild bruising but no bleeding Continues on atorvastatin 80mg  daily without myalgias  Glucose levels stable per patient - typically 120s-140s; daughter reports improvement of A1c with last check but unsure exact level (unable to view via epic)  Blood pressure today elevated but monitors at home which has been stable   Loop recorder has not downloaded report since hospitalization in May  with daughter reporting difficulty with monitoring and has had difficulty getting machine to work since implanting.   No further concerns at this time  Update 07/07/2019 JM: Carrie King is being seen for hospital follow-up accompanied by her daughter.  She was previously seen 1 year ago for hospital follow-up after cryptogenic stroke with placement of loop recorder.  She had complaints of cognitive impairment at that time.  Continues to have residual cognitive impairment worse from baseline and imbalance.  Currently working with home health physical therapy and plans on initiating speech therapy for cognition in the near future.  Cognitive deficit includes impairment of short-term memory with delayed thought process and difficulty processing information.  She also reports excessive daytime fatigue which she does not do much during the day as she has no energy and "just does not feel like it".  She is currently treated for depression with Effexor and plans on following PCP for further discussion and depression management.  She has continued on aspirin 81 mg daily and clopidogrel 75 mg daily despite 3-week recommendation but denies bleeding or bruising.  Continues on atorvastatin 40 mg daily as increased dosage was not provided at discharge.  Blood pressure today 157/93 with PCP recently increasing lisinopril due to continued elevated pressures.  Glucose levels continue to fluctuate.  Closely monitored by PCP for HTN, HLD and DM management.  Reports complete tobacco cessation since hospital discharge.  Loop recorder has not shown atrial fibrillation thus far.  No further concerns at this time.  Ms. Carrie King is a 65 y.o. female with history of HTN, DB, and stroke who  presented on 06/15/2019 with 1 week AMS.  Stroke work-up showed multiple acute and subacute bilateral infra- and supratentorial infarcts embolic pattern secondary to uncontrolled risk factors.  Loop recorder previously placed which was  interrogated and negative for atrial fibrillation.  CTA head/neck intracranial stenosis unchanged from prior imaging 1 year ago.  Recommended DAPT for 3 weeks then Plavix alone.  Hypertensive urgency with BP 197/112 on arrival stabilized during admission with long-term BP goal normotensive range.  LDL 168 and increase atorvastatin from 40 mg to 80 mg daily.  Uncontrolled DM with A1c 12.5 and recommended close PCP follow-up.  Current tobacco use with smoking cessation counseling provided.  Other stroke risk factors include UDS THC positive and prior history of stroke 04/2018.  Stroke:  Multiple acute and subacute B infra- and supratentorial infarcts embolic pattern secondary to uncontrolled stroke risk factors  CT head R temporal lobe hypodensity. Chronic basal ganglia lacunes.   MRI  Medial R temporal lobe infarct w/ petechial hemorrhage. L pons, R thalamic, L caudate body, L periatrial white matter acute and subacute infarcts.    CTA head & neck no LVO. Aortic atherosclerosis. B ICA bifurcation atherosclerosis, B siphons 50%. Distal MCA atherosclerosis. R VA 30% or origin and cervical. 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 BA mild atherosclerosis. B PCA atherosclerosis. - overall unchanged since 05/2018  Loop interrogation no afib   EEG intermittent rhythmic slowing, no sz  2D Echo EF 70 to 75%  LDL 168  HgbA1c 12.5  Lovenox 40 mg sq daily for VTE prophylaxis  aspirin 81 mg daily prior to admission, now on aspirin 81 mg daily and plavix 75mg  daily DAPT for 3 weeks and the plavix alone.   Therapy recommendations:  HH PT  Disposition:   Home     ROS:   14 system review of systems performed and negative with exception of memory loss  PMH:  Past Medical History:  Diagnosis Date  . Diabetes mellitus without complication (HCC)   . Hypertension   . Stroke (HCC)   . Tobacco abuse   . Uncontrolled diabetes mellitus (HCC) 05/16/2018    PSH:  Past Surgical History:  Procedure Laterality Date  .  EXTERNAL FIXATION ARM Left 05/14/2018   Procedure: IRRIGATION AND DEBRIDEMENT, EXTERNAL FIXATION LEFT ELBOW FRACTURE;  Surgeon: 05/16/2018, MD;  Location: [facility] OR;  Service: Orthopedics;  Laterality: Left;  . I & D EXTREMITY Left 05/18/2018   Procedure: IRRIGATION AND DEBRIDEMENT EXTREMITY;  Surgeon: 05/20/2018, MD;  Location: [facility] OR;  Service: Orthopedics;  Laterality: Left;  . LOOP RECORDER INSERTION N/A 06/01/2018   Procedure: LOOP RECORDER INSERTION;  Surgeon: 08/01/2018, MD;  [facility] INVASIVE CV LAB;  Service: Cardiovascular;  Laterality: N/A;  . ORIF HUMERUS FRACTURE Left 05/18/2018   Procedure: OPEN REDUCTION INTERNAL FIXATION (ORIF) DISTAL HUMERUS FRACTURE;  Surgeon: 05/20/2018, MD;  Location: [facility] OR;  Service: Orthopedics;  Laterality: Left;  . ORIF ULNAR FRACTURE Left 05/18/2018   Procedure: OPEN REDUCTION INTERNAL FIXATION (ORIF) ULNAR FRACTURE;  Surgeon: 05/20/2018, MD;  Location: [facility] OR;  Service: Orthopedics;  Laterality: Left;    Social History:  Social History   Socioeconomic History  . Marital status: Married    Spouse name: Not on file  . Number of children: Not on file  . Years of education: Not on file  . Highest education level: Not on file  Occupational History  . Not on file  Tobacco Use  . Smoking status: Current  Every Day Smoker    Packs/day: 1.00    Types: Cigarettes  . Smokeless tobacco: Former Neurosurgeon    Quit date: 05/14/2018  Substance and Sexual Activity  . Alcohol use: Never  . Drug use: Yes    Types: Marijuana  . Sexual activity: Not on file  Other Topics Concern  . Not on file  Social History Narrative  . Not on file   Social Determinants of Health   Financial Resource Strain: Not on file  Food Insecurity: Not on file  Transportation Needs: Not on file  Physical Activity: Not on file  Stress: Not on file  Social Connections: Not on file  Intimate Partner Violence: Not on file    Family History:  Family History   Problem Relation Age of Onset  . Hypertension Mother   . Diabetes Mother   . Hypertension Father   . Heart attack Father     Medications:   Current Outpatient Medications on File Prior to Visit  Medication Sig Dispense Refill  . acetaminophen (TYLENOL) 325 MG tablet Take 2 tablets (650 mg total) by mouth every 6 (six) hours as needed for mild pain (or Fever >/= 101).    Marland Kitchen atorvastatin (LIPITOR) 80 MG tablet Take 1 tablet (80 mg total) by mouth daily at 6 PM. 90 tablet 3  . Blood Glucose Monitoring Suppl (TRUE METRIX METER) DEVI 1 each by Does not apply route 3 (three) times daily before meals. 1 Device 0  . clopidogrel (PLAVIX) 75 MG tablet Take 75 mg by mouth daily.    Marland Kitchen glucose blood (TRUE METRIX BLOOD GLUCOSE TEST) test strip Use 3 times daily before meals (Patient taking differently: 1 each by Other route 3 (three) times daily before meals.) 100 each 12  . insulin glargine (LANTUS) 100 UNIT/ML Solostar Pen Inject 10 Units into the skin daily. 9 mL 0  . lisinopril (ZESTRIL) 10 MG tablet Take 1 tablet (10 mg total) by mouth daily. (Patient taking differently: Take 20 mg by mouth daily.) 90 tablet 0  . TRUEplus Lancets 28G MISC 1 each by Does not apply route 3 (three) times daily before meals. 100 each 12  . venlafaxine XR (EFFEXOR-XR) 75 MG 24 hr capsule Take 75 mg by mouth daily with breakfast.     No current facility-administered medications on file prior to visit.    Allergies:  No Known Allergies   Physical Exam Today's Vitals   04/24/20 1535  BP: 122/86  Pulse: 78  Weight: 158 lb (71.7 kg)  Height: 5\' 5"  (1.651 m)   Body mass index is 26.29 kg/m.  General: well developed, well nourished,  pleasant middle-age Caucasian female, seated, in no evident distress Head: head normocephalic and atraumatic.   Neck: supple with no carotid or supraclavicular bruits Cardiovascular: regular rate and rhythm, no murmurs Musculoskeletal: no deformity Skin:  no  rash/petichiae Vascular:  Normal pulses all extremities   Neurologic Exam Mental Status: Awake and fully alert. Fluent speech and language.  Recent memory delayed and remote memory intact.  Able to follow commands without difficulty.  Attention span, concentration and fund of knowledge appropriate.  Mood and affect appropriate. Cranial Nerves: Pupils equal, briskly reactive to light. Extraocular movements full without nystagmus. Visual fields full to confrontation. Hearing intact. Facial sensation intact.  Mild right lower facial weakness. Motor: Normal bulk and tone. Normal strength in all tested extremity muscles. Sensory.: intact to touch , pinprick , position and vibratory sensation.  Coordination: Rapid alternating movements normal in  all extremities. Finger-to-nose and heel-to-shin performed accurately bilaterally. Gait and Station: Arises from chair without difficulty. Stance is normal. Gait demonstrates  normal stride length with slightly stiffened left leg and mild unsteadiness.  Able to heel toe walk and tandem walk without difficulty.   Reflexes: 1+ and symmetric. Toes downgoing.      ASSESSMENT/PLAN: Carrie King is a 65 y.o. year old female here with recent multiple acute and subacute B infra- and supratentorial infarcts embolic pattern secondary to uncontrolled risk factors with residual worsening cognitive impairment and imbalance.  Previously seen for left lateral medullary infarct and right ACA punctate infarct embolic pattern on 05/30/2018 secondary to undetermined source therefore loop recorder placed to rule out atrial fibrillation. Vascular risk factors include DM, HTN, HLD, tobacco use, intracranial stenosis and prior stroke on imaging.      1. Recurrent strokes:  a. Residual cognitive impairment: Stable since prior visit.  Discussed memory exercises, importance of managing stroke risk factors, healthy diet, adequate exercise and healthy sleep hygiene b. Continue  clopidogrel 75 mg daily  and atorvastatin 80 mg daily for secondary stroke prevention.   c. Loop recorder: Last report 05/2019 - per daughter, difficulty with monitor since implant in 05/2018. Will reach out to cardiology for further assistance. Also provided contact information for [employer] to hook up monitor via WiFi  d. Discussed secondary stroke prevention measures and importance of close PCP follow-up for aggressive stroke risk factor management 2. HTN: BP goal<130/90.  Well-controlled on current regimen per PCP 3. HLD: LDL goal <70.  Continue atorvastatin 80 mg daily.  Managed and monitored by PCP.  Request obtaining updated lipid panel by PCP as patient is not currently fasting 4. DMII: A1c goal <7.0.  Recent A1c 5 (per daughter report, unable to view via epic).  Monitor by PCP   Follow up in 6 month or call earlier if needed  CC:  [facility] provider: Dr. Darryl LentSethi Cole, Dagoberto Ligasawn Watkins, NP    I spent 30 minutes of face-to-face and non-face-to-face time with patient and daughter.  This included previsit chart review, lab review, study review, order entry, electronic health record documentation, patient education and discussion regarding prior strokes, residual deficits, importance of managing stroke risk factors, ongoing use of medications, monitoring of loop recorder, and answered all other questions to patient and daughter satisfaction  Ihor AustinJessica McCue, AGNP-BC  [facility] [address] [place] [id number]  Phone [phone] Fax [phone] Note: This document was prepared with digital dictation and possible smart phrase technology. Any transcriptional errors that result from this process are unintentional.

## 2020-04-24 NOTE — Patient Instructions (Signed)
Will attempt to contact cardiology regarding monitor recommendations but also recommend contacting [employer] for further discussions on setting up your monitor wirelessly -additional information is also provided  Continue clopidogrel 75 mg daily  and atorvastatin 80 mg daily for secondary stroke prevention  Continue to follow up with PCP regarding blood pressure, cholesterol and diabetes management  Maintain strict control of hypertension with blood pressure goal below 130/90, diabetes with hemoglobin A1c goal below 7% and cholesterol with LDL cholesterol (bad cholesterol) goal below 70 mg/dL.       Followup in the future with me in 6 months or call earlier if needed       Thank you for coming to see [place] at [facility]. I hope we have been able to provide you high quality care today.  You may receive a patient satisfaction survey over the next few weeks. We would appreciate your feedback and comments so that we may continue to improve ourselves and the health of our patients.

## 2020-10-31 ENCOUNTER — Encounter: Payer: Self-pay | Admitting: Adult Health

## 2020-10-31 ENCOUNTER — Ambulatory Visit: Payer: Medicaid Other | Admitting: Adult Health

## 2021-01-16 DIAGNOSIS — E782 Mixed hyperlipidemia: Secondary | ICD-10-CM | POA: Diagnosis not present

## 2021-01-16 DIAGNOSIS — I639 Cerebral infarction, unspecified: Secondary | ICD-10-CM | POA: Diagnosis not present

## 2021-01-16 DIAGNOSIS — F339 Major depressive disorder, recurrent, unspecified: Secondary | ICD-10-CM | POA: Diagnosis not present

## 2021-01-16 DIAGNOSIS — E785 Hyperlipidemia, unspecified: Secondary | ICD-10-CM | POA: Diagnosis not present

## 2021-01-16 DIAGNOSIS — I10 Essential (primary) hypertension: Secondary | ICD-10-CM | POA: Diagnosis not present

## 2021-01-16 DIAGNOSIS — J449 Chronic obstructive pulmonary disease, unspecified: Secondary | ICD-10-CM | POA: Diagnosis not present

## 2021-01-16 DIAGNOSIS — R69 Illness, unspecified: Secondary | ICD-10-CM | POA: Diagnosis not present

## 2021-01-16 DIAGNOSIS — E119 Type 2 diabetes mellitus without complications: Secondary | ICD-10-CM | POA: Diagnosis not present

## 2021-01-16 DIAGNOSIS — N6321 Unspecified lump in the left breast, upper outer quadrant: Secondary | ICD-10-CM | POA: Diagnosis not present

## 2021-02-08 DIAGNOSIS — R928 Other abnormal and inconclusive findings on diagnostic imaging of breast: Secondary | ICD-10-CM | POA: Diagnosis not present

## 2021-02-08 DIAGNOSIS — N6321 Unspecified lump in the left breast, upper outer quadrant: Secondary | ICD-10-CM | POA: Diagnosis not present
